# Patient Record
Sex: Female | Born: 1990
Health system: Southern US, Community
[De-identification: ages and names within clinical notes are randomized; demographics above are authoritative.]

## PROBLEM LIST (undated history)

## (undated) ENCOUNTER — Inpatient Hospital Stay (HOSPITAL_COMMUNITY): Payer: Self-pay

## (undated) DIAGNOSIS — E282 Polycystic ovarian syndrome: Secondary | ICD-10-CM

## (undated) DIAGNOSIS — R079 Chest pain, unspecified: Secondary | ICD-10-CM

## (undated) DIAGNOSIS — Z8489 Family history of other specified conditions: Secondary | ICD-10-CM

## (undated) DIAGNOSIS — R87629 Unspecified abnormal cytological findings in specimens from vagina: Secondary | ICD-10-CM

## (undated) DIAGNOSIS — G43909 Migraine, unspecified, not intractable, without status migrainosus: Secondary | ICD-10-CM

## (undated) HISTORY — PX: COLPOSCOPY: SHX161

## (undated) HISTORY — PX: WISDOM TOOTH EXTRACTION: SHX21

## (undated) HISTORY — DX: Unspecified abnormal cytological findings in specimens from vagina: R87.629

## (undated) HISTORY — DX: Chest pain, unspecified: R07.9

## (undated) HISTORY — DX: Migraine, unspecified, not intractable, without status migrainosus: G43.909

## (undated) HISTORY — PX: LAPAROSCOPIC GASTRIC SLEEVE RESECTION: SHX5895

---
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
## 2012-04-28 DIAGNOSIS — I880 Nonspecific mesenteric lymphadenitis: Secondary | ICD-10-CM | POA: Insufficient documentation

## 2012-04-28 DIAGNOSIS — R1012 Left upper quadrant pain: Secondary | ICD-10-CM | POA: Insufficient documentation

## 2012-04-28 DIAGNOSIS — R1011 Right upper quadrant pain: Secondary | ICD-10-CM | POA: Insufficient documentation

## 2012-04-29 ENCOUNTER — Emergency Department (HOSPITAL_COMMUNITY): Payer: BC Managed Care – PPO

## 2012-04-29 ENCOUNTER — Encounter (HOSPITAL_COMMUNITY): Payer: Self-pay | Admitting: Emergency Medicine

## 2012-04-29 ENCOUNTER — Emergency Department (HOSPITAL_COMMUNITY)
Admission: EM | Admit: 2012-04-29 | Discharge: 2012-04-29 | Disposition: A | Discharge status: Home / Self Care | Payer: BC Managed Care – PPO | Attending: Emergency Medicine | Admitting: Emergency Medicine

## 2012-04-29 DIAGNOSIS — I880 Nonspecific mesenteric lymphadenitis: Secondary | ICD-10-CM

## 2012-04-29 HISTORY — DX: Polycystic ovarian syndrome: E28.2

## 2012-04-29 LAB — COMPREHENSIVE METABOLIC PANEL
ALT: 14 U/L (ref 0–35)
CO2: 24 mEq/L (ref 19–32)
Calcium: 9.2 mg/dL (ref 8.4–10.5)
Creatinine, Ser: 0.69 mg/dL (ref 0.50–1.10)
GFR calc Af Amer: >90 mL/min (ref >90)
GFR calc non Af Amer: >90 mL/min (ref >90)
Glucose, Bld: 92 mg/dL (ref 70–99)

## 2012-04-29 LAB — CBC
HCT: 37.6 % (ref 36.0–46.0)
MCV: 83.4 fL (ref 78.0–100.0)
RBC: 4.51 MIL/uL (ref 3.87–5.11)
WBC: 10.8 10*3/uL — ABNORMAL HIGH (ref 4.0–10.5)

## 2012-04-29 LAB — URINALYSIS, ROUTINE W REFLEX MICROSCOPIC
Glucose, UA: NEGATIVE mg/dL
Hgb urine dipstick: NEGATIVE
Specific Gravity, Urine: 1.023 (ref 1.005–1.030)

## 2012-04-29 LAB — DIFFERENTIAL
Eosinophils Relative: 5 % (ref 0–5)
Lymphocytes Relative: 29 % (ref 12–46)
Lymphs Abs: 3.1 10*3/uL (ref 0.7–4.0)
Monocytes Absolute: 0.9 10*3/uL (ref 0.1–1.0)
Monocytes Relative: 8 % (ref 3–12)

## 2012-04-29 LAB — URINE MICROSCOPIC-ADD ON

## 2012-04-29 MED ORDER — IBUPROFEN 600 MG PO TABS: 600.0000 mg | ORAL_TABLET | Freq: Four times a day (QID) | ORAL | Status: AC | PRN

## 2012-04-29 MED ORDER — IOHEXOL 300 MG/ML  SOLN
100.0000 mL | Freq: Once | INTRAMUSCULAR | Status: AC | PRN
  Administered 2012-04-29: 100 mL via INTRAVENOUS

## 2012-04-29 NOTE — ED Notes (Signed)
Pt alert, nad, c/o RLQ abd pain, onset last PM, states pain is sharp, non radiating, resp even unlabored, skin pwd, denies changes in bowel or bladder habits

## 2012-04-29 NOTE — Discharge Instructions (Signed)
Mesenteric Adenitis  Mesenteric adenitis is an inflammation of lymph nodes (glands) in the abdomen. It may appear to mimic appendicitis symptoms. It is most common in children. The cause of this may be an infection somewhere else in the body. It usually gets well without treatment but can cause problems for up to a couple weeks.  SYMPTOMS   The most common problems are:   Fever.   Abdominal pain and tenderness.   Nausea, vomiting, and/or diarrhea.  DIAGNOSIS   Your caregiver may have an idea what is wrong by examining you or your child. Sometimes lab work and other studies such as Ultrasonography and a CT scan of the abdomen are done.   TREATMENT   Children with mesenteric adenitis will get well without further treatment. Treatment includes rest, pain medications, and fluids.  HOME CARE INSTRUCTIONS    Do not take or give laxatives unless ordered by your caregiver.   Use pain medications as directed.   Follow the diet recommended by your caregiver.  SEEK IMMEDIATE MEDICAL CARE IF:    The pain does not go away or becomes severe.   An oral temperature above 102 F (38.9 C) develops.   Repeated vomiting occurs.   The pain becomes localized in the right lower quadrant of the abdomen (possibly appendicitis).   You or your child notice bright red or black tarry stools.  MAKE SURE YOU:    Understand these instructions.   Will watch your condition.   Will get help right away if you are not doing well or get worse.  Document Released: 09/06/2006 Document Revised: 11/22/2011 Document Reviewed: 09/19/2006  ExitCare Patient Information 2012 [employer].

## 2012-04-29 NOTE — ED Notes (Signed)
Patient returned from CT

## 2012-04-29 NOTE — ED Provider Notes (Signed)
History     CSN: [id number]  Arrival date & time 04/28/12  2356   First MD Initiated Contact with Krystal Ferguson 04/29/12 0118      Chief Complaint  Krystal Ferguson presents with  . Abdominal Pain    (Consider location/radiation/quality/duration/timing/severity/associated sxs/prior treatment) HPI Comments: Krystal Ferguson states she started with.  Umbilical pain yesterday.  That is now gradually migrated to the right upper and lower quadrants.  She does have a history of polycystic ovarian syndrome.  Her last menstrual cycle was approximately 2 weeks ago.  She states she knows when she has an ovarian cyst in this is a different type of pain.  She is anorexic.  Denies dysuria, vaginal discharge, constipation, or diarrhea.  Krystal Ferguson is a 21 y.o. female presenting with abdominal pain. The history is provided by the Krystal Ferguson.  Abdominal Pain The primary symptoms of the illness include abdominal pain and nausea. The primary symptoms of the illness do not include fever, fatigue, shortness of breath, vomiting, diarrhea, dysuria, vaginal discharge or vaginal bleeding. The current episode started yesterday. The onset of the illness was gradual.  The abdominal pain began yesterday. The pain came on gradually. The abdominal pain has been gradually worsening since its onset. The abdominal pain is located in the RUQ and RLQ. The abdominal pain does not radiate. The severity of the abdominal pain is 5/10. The abdominal pain is relieved by nothing.  The Krystal Ferguson states that she believes she is currently not pregnant. The Krystal Ferguson has not had a change in bowel habit. Additional symptoms associated with the illness include anorexia. Symptoms associated with the illness do not include chills, constipation, urgency, frequency or back pain.    Past Medical History  Diagnosis Date  . Polycystic ovaries   . Diabetes mellitus     History reviewed. No pertinent past surgical history.  No family history on file.  History  Substance  Use Topics  . Smoking status: Never Smoker   . Smokeless tobacco: Not on file  . Alcohol Use: No    OB History    Grav Para Term Preterm Abortions TAB SAB Ect Mult Living                  Review of Systems  Constitutional: Positive for appetite change. Negative for fever, chills and fatigue.  Respiratory: Negative for shortness of breath.   Gastrointestinal: Positive for nausea, abdominal pain and anorexia. Negative for vomiting, diarrhea and constipation.  Genitourinary: Negative for dysuria, urgency, frequency, decreased urine volume, vaginal bleeding and vaginal discharge.  Musculoskeletal: Negative for back pain.    Allergies  Review of Krystal Ferguson's allergies indicates no known allergies.  Home Medications   Current Outpatient Rx  Name Route Sig Dispense Refill  . CETIRIZINE HCL 10 MG PO TABS Oral Take 10 mg by mouth daily.    . GUAIFENESIN ER 600 MG PO TB12 Oral Take 1,200 mg by mouth 2 (two) times daily.    Carma Leaven M PLUS PO TABS Oral Take 1 tablet by mouth daily.    Kathrynn Running ESTRADIOL-FE 0.8-25 MG-MCG PO CHEW Oral Chew 1 tablet by mouth daily.    . IBUPROFEN 600 MG PO TABS Oral Take 1 tablet (600 mg total) by mouth every 6 (six) hours as needed for pain. 30 tablet 0    BP 118/78  Pulse 89  Temp 98 F (36.7 C)  Resp 16  Wt 190 lb (86.183 kg)  SpO2 99%  LMP 04/15/2012  Physical Exam  Constitutional: She appears  well-developed and well-nourished.  HENT:  Head: Normocephalic.  Eyes: Pupils are equal, round, and reactive to light.  Neck: Normal range of motion.  Cardiovascular: Normal rate.   Pulmonary/Chest: Effort normal.  Abdominal: She exhibits no distension. There is tenderness. There is no rebound.      ED Course  Procedures (including critical care time)  Labs Reviewed  URINALYSIS, ROUTINE W REFLEX MICROSCOPIC - Abnormal; Notable for the following:    APPearance CLOUDY (*)    Leukocytes, UA SMALL (*)    All other components within normal  limits  CBC - Abnormal; Notable for the following:    WBC 10.8 (*)    Hemoglobin 11.9 (*)    All other components within normal limits  COMPREHENSIVE METABOLIC PANEL - Abnormal; Notable for the following:    Potassium 3.4 (*)    Albumin 3.3 (*)    Total Bilirubin 0.1 (*)    All other components within normal limits  URINE MICROSCOPIC-ADD ON - Abnormal; Notable for the following:    Squamous Epithelial / LPF FEW (*)    Bacteria, UA MANY (*)    All other components within normal limits  DIFFERENTIAL  POCT PREGNANCY, URINE   Ct Abdomen Pelvis W Contrast  04/29/2012  *RADIOLOGY REPORT*  Clinical Data: Right lower quadrant pain.  White cell count 10.8.  CT ABDOMEN AND PELVIS WITH CONTRAST  Technique:  Multidetector CT imaging of the abdomen and pelvis was performed following the standard protocol during bolus administration of intravenous contrast.  Contrast: OMNIPAQUE IOHEXOL 300 MG/ML  SOLN  Comparison: None.  Findings: Minimal dependent changes in the lung bases.  The liver, spleen, gallbladder, pancreas, adrenal glands, kidneys, abdominal aorta, and retroperitoneal lymph nodes are unremarkable. Portal and mesenteric vessels appear patent.  Stomach, small bowel, and colon are not distended.  No free air or free fluid in the abdomen.  Stool filled colon.  Pelvis:  The bladder wall is not thickened.  Uterus and adnexal structures are not enlarged.  No free or loculated pelvic fluid collections.  The appendix is normal.  Scattered lymph nodes in the right lower quadrant mesentery are at the upper limits of normal size.  These may be reactive nodes.  Mesenteric adenitis is not excluded.  Normal alignment of the lumbar vertebrae.  IMPRESSION: Mild nonspecific prominence of right lower quadrant mesenteric lymph nodes could represent reactive changes or mesenteric adenitis.  The appendix is normal.  No acute inflammatory process demonstrated in the abdomen or pelvis.  Original Report Authenticated By:  Marlon Pel, M.D.     1. Mesenteric adenitis       MDM  Both right upper and right lower quadrant, pain, we'll perform pelvic exam as she is in the right time frame for an ovarian, cyst.  We'll also obtain labs after review of this will determine if ultrasound or CT scan would be best for imaging        Arman Filter, NP 04/29/12 [phone]

## 2012-04-29 NOTE — ED Provider Notes (Signed)
Medical screening examination/treatment/procedure(s) were performed by non-physician practitioner and as supervising physician I was immediately available for consultation/collaboration.    Vida Roller, MD 04/29/12 [phone]

## 2012-08-22 ENCOUNTER — Emergency Department (HOSPITAL_COMMUNITY): Payer: BC Managed Care – PPO

## 2012-08-22 ENCOUNTER — Emergency Department (HOSPITAL_COMMUNITY)
Admission: EM | Admit: 2012-08-22 | Discharge: 2012-08-23 | Disposition: A | Discharge status: Home / Self Care | Payer: BC Managed Care – PPO | Attending: Emergency Medicine | Admitting: Emergency Medicine

## 2012-08-22 ENCOUNTER — Encounter (HOSPITAL_COMMUNITY): Payer: Self-pay | Admitting: *Deleted

## 2012-08-22 DIAGNOSIS — E119 Type 2 diabetes mellitus without complications: Secondary | ICD-10-CM | POA: Insufficient documentation

## 2012-08-22 DIAGNOSIS — R0789 Other chest pain: Secondary | ICD-10-CM

## 2012-08-22 DIAGNOSIS — R079 Chest pain, unspecified: Secondary | ICD-10-CM | POA: Insufficient documentation

## 2012-08-22 DIAGNOSIS — R000 Tachycardia, unspecified: Secondary | ICD-10-CM | POA: Insufficient documentation

## 2012-08-22 LAB — CBC
Hemoglobin: 12.5 g/dL (ref 12.0–15.0)
MCH: 28.2 pg (ref 26.0–34.0)
MCHC: 32.6 g/dL (ref 30.0–36.0)

## 2012-08-22 LAB — POCT I-STAT, CHEM 8
BUN: 8 mg/dL (ref 6–23)
Creatinine, Ser: 0.8 mg/dL (ref 0.50–1.10)
Glucose, Bld: 113 mg/dL — ABNORMAL HIGH (ref 70–99)
Hemoglobin: 13.3 g/dL (ref 12.0–15.0)
Potassium: 3.6 mEq/L (ref 3.5–5.1)

## 2012-08-22 LAB — POCT I-STAT TROPONIN I

## 2012-08-22 NOTE — ED Notes (Signed)
New EKG given to Dr Lynelle Doctor

## 2012-08-22 NOTE — ED Notes (Addendum)
Pt to ED c/o central chest pain, sob and tachycardia and diaphoresis.  Pt has been experiencing intermittent chest pain for 1 week, but tonight the pain increased. Pain and sob does not increase/decrease with movement or activity.  Pt states she was given maalox before coming to ED.

## 2012-08-22 NOTE — ED Notes (Signed)
The pt has had mid-chest pain for one to two weeks.  No cough no cold she reports she has sob.  No sob at present. Alert  Oriented skin warm and dry. 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

## 2012-08-23 LAB — D-DIMER, QUANTITATIVE: D-Dimer, Quant: 0.3 ug/mL-FEU (ref 0.00–0.48)

## 2012-08-23 MED ORDER — IBUPROFEN 600 MG PO TABS: 600.0000 mg | ORAL_TABLET | Freq: Four times a day (QID) | ORAL | Status: AC | PRN

## 2012-08-23 MED ORDER — KETOROLAC TROMETHAMINE 60 MG/2ML IM SOLN
60.0000 mg | Freq: Once | INTRAMUSCULAR | Status: AC
  Administered 2012-08-23: 60 mg via INTRAMUSCULAR
  Filled 2012-08-23: qty 2

## 2012-08-23 NOTE — ED Provider Notes (Signed)
History     CSN: [id number]  Arrival date & time 08/22/12  1911   First MD Initiated Contact with Patient 08/22/12 2357      Chief Complaint  Patient presents with  . Chest Pain  . Tachycardia    (Consider location/radiation/quality/duration/timing/severity/associated sxs/prior treatment) Patient is a 21 y.o. female presenting with chest pain. The history is provided by the patient. No language interpreter was used.  Chest Pain The chest pain began 5 - 7 days ago. Chest pain occurs constantly. The chest pain is worsening. Associated with: none. At its most intense, the pain is at 10/10. The pain is currently at 10/10. The quality of the pain is described as sharp. The pain does not radiate. Exacerbated by: none. Pertinent negatives for primary symptoms include no fever, no syncope, no cough and no nausea.  Associated symptoms include diaphoresis. She tried nothing for the symptoms. Risk factors include obesity.  Pertinent negatives for past medical history include no MI.  Pertinent negatives for family medical history include: no CAD in family.  Procedure history is negative for cardiac catheterization.     Past Medical History  Diagnosis Date  . Polycystic ovaries   . Diabetes mellitus     History reviewed. No pertinent past surgical history.  No family history on file.  History  Substance Use Topics  . Smoking status: Never Smoker   . Smokeless tobacco: Not on file  . Alcohol Use: No    OB History    Grav Para Term Preterm Abortions TAB SAB Ect Mult Living                  Review of Systems  Constitutional: Positive for diaphoresis. Negative for fever.  Respiratory: Negative for cough.   Cardiovascular: Positive for chest pain. Negative for leg swelling and syncope.  Gastrointestinal: Negative for nausea.  All other systems reviewed and are negative.    Allergies  Review of patient's allergies indicates no known allergies.  Home Medications   Current  Outpatient Rx  Name Route Sig Dispense Refill  . METFORMIN HCL 500 MG PO TABS Oral Take 500 mg by mouth 2 (two) times daily with a meal.    . NORETHIN-ETH ESTRADIOL-FE 0.8-25 MG-MCG PO CHEW Oral Chew 1 tablet by mouth daily.      BP 122/85  Pulse 77  Temp 98.4 F (36.9 C) (Oral)  Resp 16  Ht 5\' 3"  (1.6 m)  Wt 183 lb (83.008 kg)  BMI 32.42 kg/m2  SpO2 99%  LMP 08/13/2012  Physical Exam  Constitutional: She is oriented to person, place, and time. She appears well-developed and well-nourished. No distress.  HENT:  Head: Normocephalic and atraumatic.  Mouth/Throat: Oropharynx is clear and moist.  Eyes: Conjunctivae are normal. Pupils are equal, round, and reactive to light.  Neck: Normal range of motion. Neck supple.  Cardiovascular: Normal rate and regular rhythm.   Pulmonary/Chest: Effort normal and breath sounds normal. She has no wheezes. She has no rales.  Abdominal: Soft. Bowel sounds are normal. There is no tenderness. There is no rebound and no guarding.  Neurological: She is alert and oriented to person, place, and time.  Skin: Skin is warm and dry.  Psychiatric: She has a normal mood and affect.    ED Course  Procedures (including critical care time)  Labs Reviewed  POCT I-STAT, CHEM 8 - Abnormal; Notable for the following:    Glucose, Bld 113 (*)     All other components within normal  limits  CBC  POCT PREGNANCY, URINE  POCT I-STAT TROPONIN I  D-DIMER, QUANTITATIVE   Dg Chest 2 View  08/22/2012  *RADIOLOGY REPORT*  Clinical Data: Chest pain.  Tachycardia.  Shortness of breath.  CHEST - 2 VIEW  Comparison: Chest x-ray 09/26/2011.  Findings: Lung volumes are normal.  No consolidative airspace disease.  No pleural effusions.  No pneumothorax.  No pulmonary nodule or mass noted.  Pulmonary vasculature and the cardiomediastinal silhouette are within normal limits.  IMPRESSION: 1. No radiographic evidence of acute cardiopulmonary disease.   Original Report Authenticated  By: Florencia Reasons, M.D.      No diagnosis found.    MDM   Date: 08/23/2012  Rate: 80  Rhythm: normal sinus rhythm  QRS Axis: normal  Intervals: normal  ST/T Wave abnormalities: normal  Conduction Disutrbances: none  Narrative Interpretation: unremarkable      Follow up with your family doctor for ongoing care.  Return for worsening symptoms.        Jasmine Awe, MD 08/23/12 [phone]

## 2012-10-20 ENCOUNTER — Emergency Department (HOSPITAL_COMMUNITY)
Admission: EM | Admit: 2012-10-20 | Discharge: 2012-10-20 | Disposition: A | Discharge status: Home / Self Care | Payer: BC Managed Care – PPO | Attending: Emergency Medicine | Admitting: Emergency Medicine

## 2012-10-20 ENCOUNTER — Encounter (HOSPITAL_COMMUNITY): Payer: Self-pay | Admitting: Emergency Medicine

## 2012-10-20 DIAGNOSIS — E119 Type 2 diabetes mellitus without complications: Secondary | ICD-10-CM | POA: Insufficient documentation

## 2012-10-20 DIAGNOSIS — R071 Chest pain on breathing: Secondary | ICD-10-CM | POA: Insufficient documentation

## 2012-10-20 DIAGNOSIS — Z8742 Personal history of other diseases of the female genital tract: Secondary | ICD-10-CM | POA: Insufficient documentation

## 2012-10-20 DIAGNOSIS — R0602 Shortness of breath: Secondary | ICD-10-CM | POA: Insufficient documentation

## 2012-10-20 DIAGNOSIS — R0789 Other chest pain: Secondary | ICD-10-CM

## 2012-10-20 DIAGNOSIS — Z79899 Other long term (current) drug therapy: Secondary | ICD-10-CM | POA: Insufficient documentation

## 2012-10-20 LAB — POCT I-STAT TROPONIN I: Troponin i, poc: 0.01 ng/mL (ref 0.00–0.08)

## 2012-10-20 LAB — GLUCOSE, CAPILLARY: Glucose-Capillary: 81 mg/dL (ref 70–99)

## 2012-10-20 LAB — D-DIMER, QUANTITATIVE: D-Dimer, Quant: 0.35 ug/mL-FEU (ref 0.00–0.48)

## 2012-10-20 MED ORDER — IBUPROFEN 200 MG PO TABS
600.0000 mg | ORAL_TABLET | Freq: Once | ORAL | Status: AC
  Administered 2012-10-20: 600 mg via ORAL
  Filled 2012-10-20: qty 3

## 2012-10-20 MED ORDER — OXYCODONE-ACETAMINOPHEN 5-325 MG PO TABS
1.0000 | ORAL_TABLET | Freq: Once | ORAL | Status: AC
  Administered 2012-10-20: 1 via ORAL
  Filled 2012-10-20: qty 1

## 2012-10-20 NOTE — ED Provider Notes (Signed)
History     CSN: [id number]  Arrival date & time 10/20/12  1622   First MD Initiated Contact with Patient 10/20/12 1847      Chief Complaint  Patient presents with  . Chest Pain    (Consider location/radiation/quality/duration/timing/severity/associated sxs/prior treatment) Patient is a 21 y.o. female presenting with chest pain. The history is provided by the patient. No language interpreter was used.  Chest Pain The chest pain began 6 - 12 hours ago. Chest pain occurs rarely. The chest pain is improving. The pain is associated with stress. At its most intense, the pain is at 8/10. The pain is currently at 7/10. Primary symptoms include shortness of breath.    21 year old female coming in for the second time in 2 months for chest tightness and shortness of breath. States she was sitting in the classroom and he came on this time. States that the heaviness is 8/10 to her chest presently. Patient does not appear short of breath or in any distress sitting in a wheelchair.   Past Medical History  Diagnosis Date  . Polycystic ovaries   . Diabetes mellitus     History reviewed. No pertinent past surgical history.  No family history on file.  History  Substance Use Topics  . Smoking status: Never Smoker   . Smokeless tobacco: Not on file  . Alcohol Use: No    OB History    Grav Para Term Preterm Abortions TAB SAB Ect Mult Living                  Review of Systems  Constitutional: Negative.   HENT: Negative.   Eyes: Negative.   Respiratory: Positive for shortness of breath.   Cardiovascular: Positive for chest pain.  Gastrointestinal: Negative.   Neurological: Negative.   Psychiatric/Behavioral: Negative.   All other systems reviewed and are negative.    Allergies  Review of patient's allergies indicates no known allergies.  Home Medications   Current Outpatient Rx  Name  Route  Sig  Dispense  Refill  . CETIRIZINE HCL 10 MG PO TABS   Oral   Take 10 mg by  mouth every evening.         . ADULT MULTIVITAMIN W/MINERALS CH   Oral   Take 1 tablet by mouth daily.         Kathrynn Running ESTRADIOL-FE 0.8-25 MG-MCG PO CHEW   Oral   Chew 1 tablet by mouth daily.           BP 104/83  Pulse 72  Temp 98.9 F (37.2 C) (Oral)  Resp 20  SpO2 99%  LMP 10/13/2012  Physical Exam  Nursing note and vitals reviewed. Constitutional: She is oriented to person, place, and time. She appears well-developed and well-nourished.  HENT:  Head: Normocephalic and atraumatic.  Eyes: Conjunctivae normal and EOM are normal. Pupils are equal, round, and reactive to light.  Neck: Normal range of motion. Neck supple.  Cardiovascular: Normal rate, regular rhythm, normal heart sounds and intact distal pulses.  Exam reveals no gallop and no friction rub.   No murmur heard. Pulmonary/Chest: Effort normal and breath sounds normal. No respiratory distress. She exhibits tenderness.  Abdominal: Soft. Bowel sounds are normal.  Musculoskeletal: Normal range of motion. She exhibits no edema and no tenderness.  Neurological: She is alert and oriented to person, place, and time. She has normal reflexes.  Skin: Skin is warm and dry.  Psychiatric: She has a normal mood and affect.  ED Course  Procedures (including critical care time)   Labs Reviewed  GLUCOSE, CAPILLARY  D-DIMER, QUANTITATIVE   No results found.   1. Chest wall pain       MDM  21 year-old with recurring chest pain shortness of breath. I believe  she's having panic attacks. Or she could be having thyroid problems. Patient will get a PCP to followup with. D-dimer and troponin are negative. She does have tenderness upon examination to her chest. No acute distress today in the ER. She was here one month ago for the same.  Labs Reviewed  GLUCOSE, CAPILLARY  D-DIMER, QUANTITATIVE  POCT I-STAT TROPONIN I          Remi Haggard, NP 10/20/12 2047  Date: 10/20/2012  Rate: 80  Rhythm:  normal sinus rhythm  QRS Axis: normal  Intervals: normal  ST/T Wave abnormalities: normal  Conduction Disutrbances:none  Narrative Interpretation:   Old EKG Reviewed: unchanged    Remi Haggard, NP 10/20/12 2051

## 2012-10-20 NOTE — ED Notes (Signed)
Pt presenting to ed with c/o chest pain and shortness of breath pt states she was sitting in class and she couldn't breath. Pt states she had an episode x 1 month ago and she went to [facility] and everything was negative. Pt is in nad in triage.

## 2012-10-20 NOTE — ED Notes (Signed)
Pt friend sts "it's getting harder for her to breath, how long until she comes back?" Checked patients O2 saturation and heart rate again, VSS- pt assured that we are working as fast as we can and that we will call her as soon as possible.

## 2012-10-20 NOTE — ED Provider Notes (Signed)
Medical screening examination/treatment/procedure(s) were performed by non-physician practitioner and as supervising physician I was immediately available for consultation/collaboration.   Gwyneth Sprout, MD 10/20/12 2306

## 2012-11-19 ENCOUNTER — Encounter: Payer: Self-pay | Admitting: Cardiovascular Disease

## 2012-11-19 ENCOUNTER — Encounter: Payer: Self-pay | Admitting: *Deleted

## 2012-11-19 DIAGNOSIS — E282 Polycystic ovarian syndrome: Secondary | ICD-10-CM | POA: Insufficient documentation

## 2012-11-19 DIAGNOSIS — R079 Chest pain, unspecified: Secondary | ICD-10-CM | POA: Insufficient documentation

## 2012-11-20 ENCOUNTER — Ambulatory Visit (INDEPENDENT_AMBULATORY_CARE_PROVIDER_SITE_OTHER): Payer: BC Managed Care – PPO | Admitting: Cardiovascular Disease

## 2012-11-20 VITALS — BP 118/82 | HR 97 | Wt 208.0 lb

## 2012-11-20 DIAGNOSIS — R0602 Shortness of breath: Secondary | ICD-10-CM

## 2012-11-20 DIAGNOSIS — R079 Chest pain, unspecified: Secondary | ICD-10-CM

## 2012-11-20 DIAGNOSIS — E282 Polycystic ovarian syndrome: Secondary | ICD-10-CM

## 2012-11-20 LAB — SEDIMENTATION RATE: Sed Rate: 38 mm/hr — ABNORMAL HIGH (ref 0–22)

## 2012-11-20 LAB — T4, FREE: Free T4: 1 ng/dL (ref 0.60–1.60)

## 2012-11-20 LAB — C3 COMPLEMENT: C3 Complement: 148 mg/dL (ref 90–180)

## 2012-11-20 NOTE — Assessment & Plan Note (Signed)
Weight gain and some of her atypical symptoms may be related to PCOS  F/u primary

## 2012-11-20 NOTE — Assessment & Plan Note (Signed)
Atypical but in ER twice.  F/U stress echo.  Will get labs to R/O connective tissue disease, check thyroid. No relief with prednisone makes this less likely.

## 2012-11-20 NOTE — Patient Instructions (Addendum)
Your physician recommends that you schedule a follow-up appointment in: AS NEEDED  Your physician recommends that you continue on your current medications as directed. Please refer to the Current Medication list given to you today.  Your physician has requested that you have an echocardiogram. Echocardiography is a painless test that uses sound waves to create images of your heart. It provides your doctor with information about the size and shape of your heart and how well your heart's chambers and valves are working. This procedure takes approximately one hour. There are no restrictions for this procedure. DX SHORTNESS OF BREATH Your physician has requested that you have an exercise tolerance test. For further information please visit [email or web address] Please also follow instruction sheet, as given. DX CHEST PAIN  Your physician recommends that you return for lab work in: TODAY  TSH  FREE T4  SED RATE  ANA COMPLEMENT LEVEL 3 AND 4   AND RF

## 2012-11-20 NOTE — Progress Notes (Signed)
Patient ID: Krystal Ferguson, female   DOB: 08/24/1991, 21 y.o.   MRN: [id number] 21 yo referred by primary for chest pain. Seen in ER September and a few weeks ago.  W/U negative Normal ECG;s negative enzymes, One note indicated panic attacks.  Her pain is atypical sharp, occassionaly pleuritc.  Tried on steroids by primary with no change.  Denies trauma, connective tissue disease. She does not feel that she is anxious.  She does go to school at [employer] studying nursing and works at [employer].  She has done this for years.  Thyroid not checked in ER. No family history lupus , thyroid disease or inflamatory disease.  No family history of congenital heart disease.  She has fatigue, exertional dyspnea.  And occasional palpitations. No syncope.  Mild cough no fever  ROS: Denies fever, malais, weight loss, blurry vision, decreased visual acuity, cough, sputum, SOB, hemoptysis, pleuritic pain, palpitaitons, heartburn, abdominal pain, melena, lower extremity edema, claudication, or rash.  All other systems reviewed and negative   General: Affect appropriate Overweight  HEENT: normal Neck supple with no adenopathy JVP normal no bruits no thyromegaly Lungs clear with no wheezing and good diaphragmatic motion Heart:  S1/S2 no murmur,rub, gallop or click PMI normal Abdomen: benighn, BS positve, no tenderness, no AAA no bruit.  No HSM or HJR Distal pulses intact with no bruits No edema Neuro non-focal Skin warm and dry No muscular weakness  Medications Current Outpatient Prescriptions  Medication Sig Dispense Refill  . cetirizine (ZYRTEC) 10 MG tablet Take 10 mg by mouth every evening.      . Multiple Vitamin (MULTIVITAMIN WITH MINERALS) TABS Take 1 tablet by mouth daily.      . Norethindrone-Ethinyl Estradiol-Fe (GENERESS FE) 0.8-25 MG-MCG tablet Chew 1 tablet by mouth daily.        Allergies Review of patient's allergies indicates no known allergies.  Family History: No family history on  file.  Social History: History   Social History  . Marital Status: Single    Spouse Name: N/A    Number of Children: N/A  . Years of Education: N/A   Occupational History  . Not on file.   Social History Main Topics  . Smoking status: Never Smoker   . Smokeless tobacco: Not on file  . Alcohol Use: No  . Drug Use: No  . Sexually Active:    Other Topics Concern  . Not on file   Social History Narrative  . No narrative on file    Electrocardiogram:9/16  NSR normal no pericarditis,  10/20/12  SR rate 77 normal  Assessment and Plan

## 2012-11-24 ENCOUNTER — Telehealth: Payer: Self-pay | Admitting: Cardiovascular Disease

## 2012-11-24 NOTE — Telephone Encounter (Signed)
Pt rtn christine's call, pls call back

## 2012-11-24 NOTE — Telephone Encounter (Signed)
LMTCB ./CY 

## 2012-11-24 NOTE — Telephone Encounter (Signed)
PT AWARE OF LAB RESULTS./CY 

## 2012-11-27 ENCOUNTER — Ambulatory Visit (HOSPITAL_COMMUNITY): Payer: BC Managed Care – PPO | Attending: Cardiovascular Disease | Admitting: Radiology

## 2012-11-27 ENCOUNTER — Other Ambulatory Visit: Payer: Self-pay

## 2012-11-27 ENCOUNTER — Other Ambulatory Visit: Payer: Self-pay | Admitting: *Deleted

## 2012-11-27 DIAGNOSIS — R079 Chest pain, unspecified: Secondary | ICD-10-CM

## 2012-11-27 DIAGNOSIS — R072 Precordial pain: Secondary | ICD-10-CM

## 2012-11-27 DIAGNOSIS — R0602 Shortness of breath: Secondary | ICD-10-CM

## 2012-11-27 DIAGNOSIS — Z8249 Family history of ischemic heart disease and other diseases of the circulatory system: Secondary | ICD-10-CM | POA: Insufficient documentation

## 2012-11-27 DIAGNOSIS — E669 Obesity, unspecified: Secondary | ICD-10-CM | POA: Insufficient documentation

## 2012-11-27 NOTE — Progress Notes (Signed)
Echocardiogram performed.  

## 2012-11-28 ENCOUNTER — Telehealth: Payer: Self-pay | Admitting: Cardiovascular Disease

## 2012-11-28 NOTE — Telephone Encounter (Signed)
New Problem:    Patient called in returning your call.  Please call back and reel free to speak with her mother.

## 2012-11-28 NOTE — Telephone Encounter (Signed)
Pt. given Echo results, she verbalized understanding.

## 2012-12-15 ENCOUNTER — Ambulatory Visit (INDEPENDENT_AMBULATORY_CARE_PROVIDER_SITE_OTHER): Payer: BC Managed Care – PPO | Admitting: Physician Assistant

## 2012-12-15 DIAGNOSIS — R079 Chest pain, unspecified: Secondary | ICD-10-CM

## 2012-12-15 DIAGNOSIS — R0602 Shortness of breath: Secondary | ICD-10-CM

## 2012-12-15 NOTE — Progress Notes (Signed)
Exercise Treadmill Test  Pre-Exercise Testing Evaluation Rhythm: normal sinus  Rate: 75                 Test  Exercise Tolerance Test Ordering MD: Charlton Haws, MD  Interpreting MD: Tereso Newcomer, PA-C  Unique Test No: 1  Treadmill:  1  Indication for ETT: chest pain - rule out ischemia  Contraindication to ETT: No   Stress Modality: exercise - treadmill  Cardiac Imaging Performed: non   Protocol: standard Bruce - maximal  Max BP:  150/64  Max MPHR (bpm):  201 85% MPR (bpm):  171  MPHR obtained (bpm):  193 % MPHR obtained:  96  Reached 85% MPHR (min:sec):  6:15 Total Exercise Time (min-sec):  9:00  Workload in METS:  10.4 Borg Scale: 17  Reason ETT Terminated:  patient's desire to stop    ST Segment Analysis At Rest: normal ST segments - no evidence of significant ST depression With Exercise: no evidence of significant ST depression  Other Information Arrhythmia:  No Angina during ETT:  present (1) Quality of ETT:  diagnostic  ETT Interpretation:  normal - no evidence of ischemia by ST analysis  Comments: Good exercise tolerance. Patient complained of chest pain prior to starting ETT.  Pain was made worse with exercise. Normal BP response to exercise. No ST-T changes to suggest ischemia.  Clinically positive, electrically negative ETT.  Recommendations: Follow up with Dr. Charlton Haws and PCP as directed to evaluate other causes of chest pain. Signed,  Tereso Newcomer, PA-C  3:28 PM 12/15/2012

## 2012-12-19 ENCOUNTER — Ambulatory Visit (INDEPENDENT_AMBULATORY_CARE_PROVIDER_SITE_OTHER): Payer: BC Managed Care – PPO | Admitting: Family Medicine

## 2012-12-19 ENCOUNTER — Ambulatory Visit: Payer: BC Managed Care – PPO

## 2012-12-19 VITALS — BP 117/78 | HR 76 | Temp 98.5°F | Resp 16 | Ht 63.0 in | Wt 211.0 lb

## 2012-12-19 DIAGNOSIS — IMO0001 Reserved for inherently not codable concepts without codable children: Secondary | ICD-10-CM

## 2012-12-19 DIAGNOSIS — M79642 Pain in left hand: Secondary | ICD-10-CM

## 2012-12-19 DIAGNOSIS — M25552 Pain in left hip: Secondary | ICD-10-CM

## 2012-12-19 DIAGNOSIS — M79609 Pain in unspecified limb: Secondary | ICD-10-CM

## 2012-12-19 DIAGNOSIS — S6390XA Sprain of unspecified part of unspecified wrist and hand, initial encounter: Secondary | ICD-10-CM

## 2012-12-19 DIAGNOSIS — M25559 Pain in unspecified hip: Secondary | ICD-10-CM

## 2012-12-19 DIAGNOSIS — S63509A Unspecified sprain of unspecified wrist, initial encounter: Secondary | ICD-10-CM

## 2012-12-19 DIAGNOSIS — S63502A Unspecified sprain of left wrist, initial encounter: Secondary | ICD-10-CM
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
## 2012-12-19 NOTE — Progress Notes (Signed)
   Patient ID: Krystal Ferguson MRN: [id number], DOB: 08-Sep-1991, 22 y.o. Date of Encounter: 12/19/2012, 7:16 PM   PROCEDURE NOTE: Verbal consent obtained.  Buddy tape left 4th and 3rd digits. Ulnar gutter splint applied to the left extremity. Wrapped with ACE wrap per usual protocol.  Cap refill less than 2 seconds through all digits. Patient tolerated well.   SignedEula Listen, PA-C 12/19/2012 7:16 PM

## 2013-04-03 ENCOUNTER — Ambulatory Visit: Payer: 59

## 2013-05-19 ENCOUNTER — Ambulatory Visit (INDEPENDENT_AMBULATORY_CARE_PROVIDER_SITE_OTHER): Payer: 59 | Admitting: Family Medicine

## 2013-05-19 VITALS — BP 124/84 | HR 104 | Temp 98.0°F | Resp 17 | Ht 63.5 in | Wt 228.0 lb

## 2013-05-19 DIAGNOSIS — R040 Epistaxis: Secondary | ICD-10-CM

## 2013-05-19 DIAGNOSIS — E119 Type 2 diabetes mellitus without complications: Secondary | ICD-10-CM

## 2013-05-19 LAB — POCT CBC
Granulocyte percent: 70.7 % (ref 37–80)
HCT, POC: 43.4 % (ref 37.7–47.9)
Hemoglobin: 13.3 g/dL (ref 12.2–16.2)
Lymph, poc: 2.3 (ref 0.6–3.4)
MCH, POC: 28.7 pg (ref 27–31.2)
MCHC: 30.6 g/dL — AB (ref 31.8–35.4)
MCV: 93.6 fL (ref 80–97)
MID (cbc): 0.5 (ref 0–0.9)
MPV: 10.7 fL (ref 0–99.8)
POC Granulocyte: 6.6 (ref 2–6.9)
POC LYMPH PERCENT: 24.1 %L (ref 10–50)
POC MID %: 5.2 %M (ref 0–12)
Platelet Count, POC: 244 10*3/uL (ref 142–424)
RBC: 4.64 M/uL (ref 4.04–5.48)
RDW, POC: 13.2 %
WBC: 9.4 10*3/uL (ref 4.6–10.2)

## 2013-05-19 LAB — POCT GLYCOSYLATED HEMOGLOBIN (HGB A1C): Hemoglobin A1C: 5.1

## 2013-05-19 LAB — GLUCOSE, POCT (MANUAL RESULT ENTRY): POC Glucose: 80 mg/dL (ref 70–99)

## 2013-05-19 NOTE — Patient Instructions (Addendum)
Nosebleed  Nosebleeds can be caused by many conditions including trauma, infections, polyps, foreign bodies, dry mucous membranes or climate, medications and air conditioning. Most nosebleeds occur in the front of the nose. It is because of this location that most nosebleeds can be controlled by pinching the nostrils gently and continuously. Do this for at least 10 to 20 minutes. The reason for this long continuous pressure is that you must hold it long enough for the blood to clot. If during that 10 to 20 minute time period, pressure is released, the process may have to be started again. The nosebleed may stop by itself, quit with pressure, need concentrated heating (cautery) or stop with pressure from packing.  HOME CARE INSTRUCTIONS    If your nose was packed, try to maintain the pack inside until your caregiver removes it. If a gauze pack was used and it starts to fall out, gently replace or cut the end off. Do not cut if a balloon catheter was used to pack the nose. Otherwise, do not remove unless instructed.   Avoid blowing your nose for 12 hours after treatment. This could dislodge the pack or clot and start bleeding again.   If the bleeding starts again, sit up and bending forward, gently pinch the front half of your nose continuously for 20 minutes.   If bleeding was caused by dry mucous membranes, cover the inside of your nose every morning with a petroleum or antibiotic ointment. Use your little fingertip as an applicator. Do this as needed during dry weather. This will keep the mucous membranes moist and allow them to heal.   Maintain humidity in your home by using less air conditioning or using a humidifier.   Do not use aspirin or medications which make bleeding more likely. Your caregiver can give you recommendations on this.   Resume normal activities as able but try to avoid straining, lifting or bending at the waist for several days.   If the nosebleeds become recurrent and the cause is  unknown, your caregiver may suggest laboratory tests.  SEEK IMMEDIATE MEDICAL CARE IF:    Bleeding recurs and cannot be controlled.   There is unusual bleeding from or bruising on other parts of the body.   You have a fever.   Nosebleeds continue.   There is any worsening of the condition which originally brought you in.   You become lightheaded, feel faint, become sweaty or vomit blood.  MAKE SURE YOU:    Understand these instructions.   Will watch your condition.   Will get help right away if you are not doing well or get worse.  Document Released: 09/12/2005 Document Revised: 02/25/2012 Document Reviewed: 11/04/2009  [employer] Patient Information 2014 [employer].

## 2013-05-19 NOTE — Progress Notes (Signed)
[facility]:  Office Visit  Chief Complaint:  Chief Complaint  Patient presents with  . Epistaxis    HPI: Krystal Ferguson is a 22 y.o. female who complains of  Epistaxis since Sunday, 8 episodes, last 18-20 minutes. Used ice, pinching nose, sometimes helps. She has had clots. Feels dizzy. USed to get them a lot when she was younger but has not had one recently. Deneis picking her nose or putting anything up there, house is not dry. She has been blowing her nose only when she feels there is something the ie a clot.  Has diabetes last seen 1 year ago. Not on meds. Was taking metformin but PCP took her off of it. She also has PCOS   Past Medical History  Diagnosis Date  . Polycystic ovaries   . Diabetes mellitus   . Chest pain    History reviewed. No pertinent past surgical history. History   Social History  . Marital Status: Single    Spouse Name: N/A    Number of Children: N/A  . Years of Education: N/A   Social History Main Topics  . Smoking status: Never Smoker   . Smokeless tobacco: None  . Alcohol Use: No  . Drug Use: No  . Sexually Active: No   Other Topics Concern  . None   Social History Narrative  . None   Family History  Problem Relation Age of Onset  . Bladder Cancer Mother   . Kidney disease Father   . Heart disease Maternal Grandmother   . Heart disease Paternal Grandmother    No Known Allergies Prior to Admission medications   Medication Sig Start Date End Date Taking? Authorizing Provider  Multiple Vitamin (MULTIVITAMIN WITH MINERALS) TABS Take 1 tablet by mouth daily.   Yes Historical Provider, MD  Norethindrone-Ethinyl Estradiol-Fe (GENERESS FE) 0.8-25 MG-MCG tablet Chew 1 tablet by mouth daily.   Yes Historical Provider, MD     ROS: The patient denies fevers, chills, night sweats, unintentional weight loss, chest pain, palpitations, wheezing, dyspnea on exertion, nausea, vomiting, abdominal pain, dysuria, hematuria,  melena, numbness, weakness, or tingling.   All other systems have been reviewed and were otherwise negative with the exception of those mentioned in the HPI and as above.    PHYSICAL EXAM: Filed Vitals:   05/19/13 1948  BP: 124/84  Pulse: 104  Temp: 98 F (36.7 C)  Resp: 17   Filed Vitals:   05/19/13 1948  Height: 5' 3.5" (1.613 m)  Weight: 228 lb (103.42 kg)   Body mass index is 39.75 kg/(m^2).  General: Alert, no acute distress HEENT:  Normocephalic, atraumatic, oropharynx patent. + left klessenbach nasal plexus bleeding vessels Cardiovascular:  Regular rate and rhythm, no rubs murmurs or gallops.  No Carotid bruits, radial pulse intact. No pedal edema.  Respiratory: Clear to auscultation bilaterally.  No wheezes, rales, or rhonchi.  No cyanosis, no use of accessory musculature GI: No organomegaly, abdomen is soft and non-tender, positive bowel sounds.  No masses. Skin: No rashes. Neurologic: Facial musculature symmetric. Psychiatric: Patient is appropriate throughout our interaction. Lymphatic: No cervical lymphadenopathy Musculoskeletal: Gait intact.   LABS: Results for orders placed in visit on 05/19/13  POCT CBC      Result Value Range   WBC 9.4  4.6 - 10.2 K/uL   Lymph, poc 2.3  0.6 - 3.4   POC LYMPH PERCENT 24.1  10 - 50 %L   MID (cbc) 0.5  0 - 0.9  POC MID % 5.2  0 - 12 %M   POC Granulocyte 6.6  2 - 6.9   Granulocyte percent 70.7  37 - 80 %G   RBC 4.64  4.04 - 5.48 M/uL   Hemoglobin 13.3  12.2 - 16.2 g/dL   HCT, POC 13.0  86.5 - 47.9 %   MCV 93.6  80 - 97 fL   MCH, POC 28.7  27 - 31.2 pg   MCHC 30.6 (*) 31.8 - 35.4 g/dL   RDW, POC 78.4     Platelet Count, POC 244  142 - 424 K/uL   MPV 10.7  0 - 99.8 fL  POCT GLYCOSYLATED HEMOGLOBIN (HGB A1C)      Result Value Range   Hemoglobin A1C 5.1    GLUCOSE, POCT (MANUAL RESULT ENTRY)      Result Value Range   POC Glucose 80  70 - 99 mg/dl     EKG/XRAY:   Primary read interpreted by Dr. Conley Rolls at  [facility].   ASSESSMENT/PLAN: Encounter Diagnoses  Name Primary?  . Diabetes Yes  . Epistaxis    Impreganted gauze with lidocaine and epi but did not work Sport and exercise psychologist were Cauterized with Silver nitrate Patient consented to procedure Recheck diabetes, doing ok not cause of dizziness. Glucose was WNL F/u prn    LE, THAO PHUONG, DO 05/19/2013 8:39 PM

## 2013-05-20 LAB — COMPREHENSIVE METABOLIC PANEL
Albumin: 4.4 g/dL (ref 3.5–5.2)
BUN: 9 mg/dL (ref 6–23)
Calcium: 9.5 mg/dL (ref 8.4–10.5)
Chloride: 103 mEq/L (ref 96–112)
Glucose, Bld: 87 mg/dL (ref 70–99)
Potassium: 4.3 mEq/L (ref 3.5–5.3)
Sodium: 141 mEq/L (ref 135–145)
Total Protein: 7.4 g/dL (ref 6.0–8.3)

## 2013-05-20 LAB — COMPREHENSIVE METABOLIC PANEL WITH GFR
ALT: 18 U/L (ref 0–35)
AST: 18 U/L (ref 0–37)
Alkaline Phosphatase: 46 U/L (ref 39–117)
CO2: 24 meq/L (ref 19–32)
Creat: 0.83 mg/dL (ref 0.50–1.10)
Total Bilirubin: 0.3 mg/dL (ref 0.3–1.2)

## 2013-07-20 ENCOUNTER — Emergency Department (HOSPITAL_COMMUNITY)
Admission: EM | Admit: 2013-07-20 | Discharge: 2013-07-20 | Disposition: A | Discharge status: Home / Self Care | Payer: BC Managed Care – PPO | Source: Home / Self Care | Attending: Emergency Medicine | Admitting: Emergency Medicine

## 2013-07-20 ENCOUNTER — Encounter (HOSPITAL_COMMUNITY): Payer: Self-pay | Admitting: *Deleted

## 2013-07-20 DIAGNOSIS — J039 Acute tonsillitis, unspecified: Secondary | ICD-10-CM

## 2013-07-20 MED ORDER — HYDROCODONE-ACETAMINOPHEN 5-325 MG PO TABS: ORAL_TABLET | ORAL | Status: DC

## 2013-07-20 MED ORDER — PENICILLIN V POTASSIUM 500 MG PO TABS: 500.0000 mg | ORAL_TABLET | Freq: Four times a day (QID) | ORAL | Status: DC

## 2013-07-20 NOTE — ED Provider Notes (Signed)
Chief Complaint:   Chief Complaint  Patient presents with  . Sore Throat    History of Present Illness:   Krystal Ferguson is a 21 year old female, a nurse's aide and [employer] who presents today with a three-day history of sore throat, pain on swallowing, headache, and swollen glands. She denies any nasal congestion, rhinorrhea, cough, or GI symptoms. She has not been exposed to anyone with strep.  Review of Systems:  Other than as noted above, the patient denies any of the following symptoms. Systemic:  No fever, chills, sweats, fatigue, myalgias, headache, or anorexia. Eye:  No redness, pain or drainage. ENT:  No earache, ear congestion, nasal congestion, sneezing, rhinorrhea, sinus pressure, sinus pain, or post nasal drip. Lungs:  No cough, sputum production, wheezing, shortness of breath, or chest pain. GI:  No abdominal pain, nausea, vomiting, or diarrhea. Skin:  No rash or itching.  PMFSH:  Past medical history, family history, social history, meds, allergies, and nurse's notes were reviewed.  There is no known exposure to strep or mono.  No prior history of step or mono.  The patient denies use of tobacco.   Physical Exam:   Vital signs:  BP 133/79  Pulse 84  Temp(Src) 98.2 F (36.8 C) (Oral)  Resp 16  SpO2 100%  LMP 07/15/2013 General:  Alert, in no distress. Eye:  No conjunctival injection or drainage. Lids were normal. ENT:  TMs and canals were normal, without erythema or inflammation.  Nasal mucosa was clear and uncongested, without drainage.  Mucous membranes were moist.  Exam of pharynx reveals tonsils to be enlarged and red with spots of white exudate.  There were no oral ulcerations or lesions. The uvula was midline and there was no bulging of the anterior tonsillar pillars. Neck:  Supple, anterior cervical nodes were enlarged and tender. Lungs:  No respiratory distress.  Lungs were clear to auscultation, without wheezes, rales or  rhonchi.  Breath sounds were clear and equal bilaterally.  Heart:  Regular rhythm, without gallops, murmers or rubs. Skin:  Clear, warm, and dry, without rash or lesions.  Labs:   Results for orders placed during the hospital encounter of 07/20/13  POCT RAPID STREP A ([facility] ONLY)      Result Value Range   Streptococcus, Group A Screen (Direct) NEGATIVE  NEGATIVE   Assessment:  The encounter diagnosis was Tonsillitis.  No evidence of peritonsillar abscess.  Plan:   1.  The following meds were prescribed:   Discharge Medication List as of 07/20/2013  7:12 PM    START taking these medications   Details  HYDROcodone-acetaminophen (NORCO/VICODIN) 5-325 MG per tablet 1 to 2 tabs every 4 to 6 hours as needed for pain., Print    penicillin v potassium (VEETID) 500 MG tablet Take 1 tablet (500 mg total) by mouth 4 (four) times daily., Starting 07/20/2013, Until Discontinued, Normal       2.  The patient was instructed in symptomatic care including hot saline gargles, throat lozenges, infectious precautions, and need to trade out toothbrush. Handouts were given. 3.  The patient was told to return if becoming worse in any way, if no better in 3 or 4 days, and given some red flag symptoms such as difficulty swallowing or breathing that would indicate earlier return. 4.  Follow up here if necessary.    Reuben Likes, MD 07/20/13 2200

## 2013-07-20 NOTE — ED Notes (Signed)
C/o sore throat onset 3 days ago.  No fever.  C/o headache and states her glands are a little swollen.

## 2013-07-22 LAB — CULTURE, GROUP A STREP

## 2013-07-27 ENCOUNTER — Encounter (HOSPITAL_COMMUNITY): Payer: Self-pay | Admitting: *Deleted

## 2013-09-05 ENCOUNTER — Emergency Department (HOSPITAL_COMMUNITY)
Admission: EM | Admit: 2013-09-05 | Discharge: 2013-09-05 | Disposition: A | Discharge status: Home / Self Care | Payer: 59 | Attending: Emergency Medicine | Admitting: Emergency Medicine

## 2013-09-05 ENCOUNTER — Encounter (HOSPITAL_COMMUNITY): Payer: Self-pay | Admitting: Emergency Medicine

## 2013-09-05 ENCOUNTER — Emergency Department (HOSPITAL_COMMUNITY): Payer: 59

## 2013-09-05 DIAGNOSIS — Z79899 Other long term (current) drug therapy: Secondary | ICD-10-CM | POA: Insufficient documentation

## 2013-09-05 DIAGNOSIS — G43109 Migraine with aura, not intractable, without status migrainosus: Secondary | ICD-10-CM | POA: Insufficient documentation

## 2013-09-05 DIAGNOSIS — Z3202 Encounter for pregnancy test, result negative: Secondary | ICD-10-CM | POA: Insufficient documentation

## 2013-09-05 DIAGNOSIS — R209 Unspecified disturbances of skin sensation: Secondary | ICD-10-CM | POA: Insufficient documentation

## 2013-09-05 DIAGNOSIS — R9389 Abnormal findings on diagnostic imaging of other specified body structures: Secondary | ICD-10-CM | POA: Insufficient documentation

## 2013-09-05 DIAGNOSIS — E119 Type 2 diabetes mellitus without complications: Secondary | ICD-10-CM | POA: Insufficient documentation

## 2013-09-05 DIAGNOSIS — Z8742 Personal history of other diseases of the female genital tract: Secondary | ICD-10-CM | POA: Insufficient documentation

## 2013-09-05 LAB — CBC
HCT: 35.7 % — ABNORMAL LOW (ref 36.0–46.0)
Hemoglobin: 11.8 g/dL — ABNORMAL LOW (ref 12.0–15.0)
MCH: 29.1 pg (ref 26.0–34.0)
MCHC: 33.1 g/dL (ref 30.0–36.0)
RBC: 4.05 MIL/uL (ref 3.87–5.11)

## 2013-09-05 LAB — BASIC METABOLIC PANEL
BUN: 7 mg/dL (ref 6–23)
CO2: 24 mEq/L (ref 19–32)
Calcium: 9.1 mg/dL (ref 8.4–10.5)
GFR calc non Af Amer: >90 mL/min (ref >90)
Glucose, Bld: 90 mg/dL (ref 70–99)
Potassium: 3.8 mEq/L (ref 3.5–5.1)
Sodium: 136 mEq/L (ref 135–145)

## 2013-09-05 LAB — URINALYSIS, ROUTINE W REFLEX MICROSCOPIC
Bilirubin Urine: NEGATIVE
Glucose, UA: NEGATIVE mg/dL
Hgb urine dipstick: NEGATIVE
Specific Gravity, Urine: 1.016 (ref 1.005–1.030)

## 2013-09-05 LAB — URINE MICROSCOPIC-ADD ON

## 2013-09-05 LAB — POCT PREGNANCY, URINE: Preg Test, Ur: NEGATIVE

## 2013-09-05 MED ORDER — ONDANSETRON HCL 4 MG/2ML IJ SOLN
4.0000 mg | Freq: Once | INTRAMUSCULAR | Status: AC
  Administered 2013-09-05: 4 mg via INTRAVENOUS
  Filled 2013-09-05: qty 2

## 2013-09-05 MED ORDER — LORAZEPAM 2 MG/ML IJ SOLN
1.0000 mg | Freq: Once | INTRAMUSCULAR | Status: AC
  Administered 2013-09-05: 1 mg via INTRAVENOUS
  Filled 2013-09-05: qty 1

## 2013-09-05 MED ORDER — DIPHENHYDRAMINE HCL 50 MG/ML IJ SOLN
25.0000 mg | Freq: Once | INTRAMUSCULAR | Status: AC
  Administered 2013-09-05: 25 mg via INTRAVENOUS
  Filled 2013-09-05: qty 1

## 2013-09-05 MED ORDER — SODIUM CHLORIDE 0.9 % IV BOLUS (SEPSIS)
1000.0000 mL | Freq: Once | INTRAVENOUS | Status: AC
  Administered 2013-09-05: 1000 mL via INTRAVENOUS

## 2013-09-05 MED ORDER — KETOROLAC TROMETHAMINE 30 MG/ML IJ SOLN
30.0000 mg | Freq: Once | INTRAMUSCULAR | Status: AC
  Administered 2013-09-05: 30 mg via INTRAVENOUS
  Filled 2013-09-05: qty 1

## 2013-09-05 MED ORDER — SUMATRIPTAN SUCCINATE 6 MG/0.5ML ~~LOC~~ SOLN
6.0000 mg | Freq: Once | SUBCUTANEOUS | Status: AC
  Administered 2013-09-05: 6 mg via SUBCUTANEOUS
  Filled 2013-09-05: qty 0.5

## 2013-09-05 MED ORDER — METOCLOPRAMIDE HCL 5 MG/ML IJ SOLN
10.0000 mg | Freq: Once | INTRAMUSCULAR | Status: AC
  Administered 2013-09-05: 10 mg via INTRAVENOUS
  Filled 2013-09-05: qty 2

## 2013-09-05 MED ORDER — GADOBENATE DIMEGLUMINE 529 MG/ML IV SOLN
20.0000 mL | Freq: Once | INTRAVENOUS | Status: AC | PRN
  Administered 2013-09-05: 20 mL via INTRAVENOUS

## 2013-09-05 NOTE — ED Notes (Signed)
MRI notified to call RN when transporter is on way to give anti-anxiety meds. sts appx 30 min

## 2013-09-05 NOTE — ED Notes (Signed)
Spoke to [facility] from MRI- sts appx 1-2 hours wait. Pt updated.

## 2013-09-05 NOTE — ED Provider Notes (Signed)
CSN: [id number]     Arrival date & time 09/05/13  1111 History   First MD Initiated Contact with Patient 09/05/13 1147     Chief Complaint  Patient presents with  . Headache  . Numbness   (Consider location/radiation/quality/duration/timing/severity/associated sxs/prior Treatment) The history is provided by the patient and medical records. No language interpreter was used.    Krystal Ferguson is a 22 y.o. female  with a hx of migraine, PCOS, DM presents to the Emergency Department complaining of gradual, persistent, progressively worsening headache onset last PM.  Pt reports headache last night was generalized in nature and not associated with any other symptoms.  Pt awoke this morning at 7:30 am and realized that her headache was persistent and at that time she has blurry vision in the left and left facial numbness.  Pt denies weakness, numbness, tingling in the LUE or LLE.  Pt took aleve this morning without relief.  Nothing makes it better and light makes it worse.  Pt reports that the headache is located at the base of her head now, rated at a 8/10, non radiating.  Pt denies fever, chills, neck pain, neck stiffness, chest pain, SOB, abd pain, vomiting, diarrhea, weakness, dizziness, syncope, dysuria, hematuria.   Pt reports history of migraine headaches many years ago but does not remember if this is the same.  She denies hx of complex migraine with neurologic symptoms.     Past Medical History  Diagnosis Date  . Polycystic ovaries   . Chest pain   . Diabetes mellitus     pre-diabetic   History reviewed. No pertinent past surgical history. Family History  Problem Relation Age of Onset  . Bladder Cancer Mother   . Kidney disease Father   . Heart disease Maternal Grandmother   . Heart disease Paternal Grandmother    History  Substance Use Topics  . Smoking status: Never Smoker   . Smokeless tobacco: Not on file  . Alcohol Use: Yes     Comment: occassional   OB History   Grav  Para Term Preterm Abortions TAB SAB Ect Mult Living                 Review of Systems  Constitutional: Negative for fever, diaphoresis, appetite change, fatigue and unexpected weight change.  HENT: Negative for mouth sores and neck stiffness.   Eyes: Negative for visual disturbance.  Respiratory: Negative for cough, chest tightness, shortness of breath and wheezing.   Cardiovascular: Negative for chest pain.  Gastrointestinal: Negative for nausea, vomiting, abdominal pain, diarrhea and constipation.  Endocrine: Negative for polydipsia, polyphagia and polyuria.  Genitourinary: Negative for dysuria, urgency, frequency and hematuria.  Musculoskeletal: Negative for back pain.  Skin: Negative for rash.  Allergic/Immunologic: Negative for immunocompromised state.  Neurological: Positive for numbness (L face) and headaches. Negative for syncope and light-headedness.  Hematological: Does not bruise/bleed easily.  Psychiatric/Behavioral: Negative for sleep disturbance. The patient is not nervous/anxious.     Allergies  Review of patient's allergies indicates no known allergies.  Home Medications   Current Outpatient Rx  Name  Route  Sig  Dispense  Refill  . HYDROcodone-acetaminophen (NORCO/VICODIN) 5-325 MG per tablet   Oral   Take 1-2 tablets by mouth every 4 (four) hours as needed for pain.         . LO LOESTRIN FE 1 MG-10 MCG / 10 MCG tablet   Oral   Take 1 tablet by mouth daily.         Marland Kitchen  Multiple Vitamin (MULTIVITAMIN WITH MINERALS) TABS   Oral   Take 1 tablet by mouth daily.          BP 107/57  Pulse 83  Temp(Src) 98.6 F (37 C) (Oral)  Resp 16  Ht 5\' 3"  (1.6 m)  Wt 200 lb (90.719 kg)  BMI 35.44 kg/m2  SpO2 98%  LMP 08/06/2013 Physical Exam  Nursing note and vitals reviewed. Constitutional: She is oriented to person, place, and time. She appears well-developed and well-nourished. No distress.  HENT:  Head: Normocephalic and atraumatic.  Right Ear: Hearing,  tympanic membrane, external ear and ear canal normal.  Left Ear: Hearing, tympanic membrane, external ear and ear canal normal.  Nose: Nose normal. No mucosal edema.  Mouth/Throat: Uvula is midline, oropharynx is clear and moist and mucous membranes are normal. Mucous membranes are not dry. No oropharyngeal exudate, posterior oropharyngeal edema, posterior oropharyngeal erythema or tonsillar abscesses.  Tongue is midline  Eyes: Conjunctivae and EOM are normal. Pupils are equal, round, and reactive to light. Right conjunctiva is not injected. Right conjunctiva has no hemorrhage. Left conjunctiva is not injected. Left conjunctiva has no hemorrhage. No scleral icterus. Right eye exhibits normal extraocular motion and no nystagmus. Left eye exhibits normal extraocular motion and no nystagmus.  Neck: Normal range of motion. Neck supple.  Cardiovascular: Normal rate, regular rhythm, normal heart sounds and intact distal pulses.   No murmur heard. Pulmonary/Chest: Effort normal and breath sounds normal. No respiratory distress. She has no wheezes. She has no rales.  Abdominal: Soft. Bowel sounds are normal. There is no tenderness. There is no rebound and no guarding.  Musculoskeletal: Normal range of motion.  Lymphadenopathy:    She has no cervical adenopathy.  Neurological: She is alert and oriented to person, place, and time. She has normal reflexes. A cranial nerve deficit is present. She exhibits normal muscle tone. Coordination normal.  Speech is clear and goal oriented, follows commands Cranial nerves: II, IV, VI, VII, VIII, IX, XI, XII intact Deficit in V, III with mild facial droop, Pt able to raise eyebrows equally, but unable to keep left eye shut against resistance Normal strength in upper and lower extremities bilaterally, strong and equal grip strength Sensation normal to light and sharp touch Moves extremities without ataxia, coordination intact Normal finger to nose and rapid alternating  movements Neg romberg, no pronator drift Normal gait Normal heel-shin and balance   Skin: Skin is warm and dry. No rash noted. She is not diaphoretic.  Psychiatric: She has a normal mood and affect. Her behavior is normal. Judgment and thought content normal.    ED Course  Procedures (including critical care time) Labs Review Labs Reviewed  CBC - Abnormal; Notable for the following:    Hemoglobin 11.8 (*)    HCT 35.7 (*)    All other components within normal limits  URINALYSIS, ROUTINE W REFLEX MICROSCOPIC - Abnormal; Notable for the following:    APPearance HAZY (*)    Leukocytes, UA MODERATE (*)    All other components within normal limits  URINE MICROSCOPIC-ADD ON - Abnormal; Notable for the following:    Bacteria, UA MANY (*)    All other components within normal limits  URINE CULTURE  GLUCOSE, CAPILLARY  BASIC METABOLIC PANEL  POCT PREGNANCY, URINE   Imaging Review Ct Head Wo Contrast  09/05/2013   CLINICAL DATA:  Headache and numbness  EXAM: CT HEAD WITHOUT CONTRAST  TECHNIQUE: Contiguous axial images were obtained from the base of  the skull through the vertex without intravenous contrast.  COMPARISON:  None.  FINDINGS: Ventricle size is normal. Negative for intracranial hemorrhage or mass. No acute infarct.  4 x 10 mm hypodensity left frontal subcortical white matter. This appears to be a focal brain lesion rather than a deep sulcus. Question chronic infarct or demyelinating disease. No other white matter lesions are identified.  Visualized sinuses are clear. No skull lesion.  IMPRESSION: 4 x 10 mm hypodensity left posterior frontal subcortical white matter. While this could represent a deep sulcus, I would favor this represents a white matter lesion. Differential includes chronic ischemia and demyelinating disease.   Electronically Signed   By: Marlan Palau M.D.   On: 09/05/2013 13:38   Mr Laqueta Jean YN Contrast  09/05/2013   CLINICAL DATA:  Headache. Left sided numbness.   EXAM: MRI HEAD WITHOUT AND WITH CONTRAST  TECHNIQUE: Multiplanar, multiecho pulse sequences of the brain and surrounding structures were obtained according to standard protocol without and with intravenous contrast  CONTRAST:  20mL MULTIHANCE GADOBENATE DIMEGLUMINE 529 MG/ML IV SOLN  COMPARISON:  CT head today.  FINDINGS: Linear hypodensity on CT corresponds to a 4 x 11 mm well-defined hyperintensity in the left frontal parietal subcortical white matter. This has a chronic appearance and may be due to chronic ischemia. Several small white matter hyperintensities are present bilaterally. These are in the deep white matter. Periventricular white matter is normal. The brainstem and cerebellum are normal. Basil ganglia is normal.  Diffusion-weighted imaging is negative for acute infarct. Negative for hemorrhage or mass. Ventricle size is normal. No fluid collection or midline shift. Normal enhancement following contrast infusion. Paranasal sinuses are clear.  IMPRESSION: No acute infarct or mass.  Several tiny white matter hyperintensities bilaterally. Question migraine headache history. Vasculitis and chronic ischemia are other possibilities.  Well-defined hyperintensity left frontal parietal white matter correspond to the CT abnormality and appears chronic. This is most likely due to an area of chronic ischemia.   Electronically Signed   By: Marlan Palau M.D.   On: 09/05/2013 18:13    MDM   1. Complicated migraine   2. Abnormal MRI      Krystal Ferguson presents with headache and left-sided cranial nerve deficit. Head CT with 4 x 10 mm hypodensity in the left posterior frontal subcortical white matter. Discussed with Dr. Marlan Palau who read the CT and he recommends MRI followup.  He is without further neurologic deficit on exam.  UA with evidence of urinary tract infection we'll plan for antibiotics at discharge. Pregnancy Test negative, BMP and CBC unremarkable.  MRI with Several tiny white matter  hyperintensities bilaterally as well as a well-defined hyperintensity left frontal parietal white matter correspond to the CT abnormality and appears chronic.  I personally reviewed the imaging tests through PACS system.  I reviewed available ER/hospitalization records through the EMR.   Pt to be treated with a migraine cocktail and will be d/c to follow-up with PCP and further neurology referral through the PCP.   7:47 PM And given migraine cocktail without significant improvement. Patient then given sumatriptan with significant decrease in headache and resolution of neurologic findings. Patient states she feels much better. Patient will be discharged home with instructions for strict followup with primary care physician.  It has been determined that no acute conditions requiring further emergency intervention are present at this time. The patient/guardian have been advised of the diagnosis and plan. We have discussed signs and symptoms that warrant  return to the ED, such as changes or worsening in symptoms.   Vital signs are stable at discharge.   BP 107/57  Pulse 83  Temp(Src) 98.6 F (37 C) (Oral)  Resp 16  Ht 5\' 3"  (1.6 m)  Wt 200 lb (90.719 kg)  BMI 35.44 kg/m2  SpO2 98%  LMP 08/06/2013  Patient/guardian has voiced understanding and agreed to follow-up with the PCP or specialist.              Dierdre Forth, PA-C 09/05/13 1957

## 2013-09-05 NOTE — ED Notes (Signed)
Pt. Stated, I started having a headache yesterday and this am I had a headache and the left side of my face felt numb.  Also my vision seems blurry at times.

## 2013-09-05 NOTE — ED Provider Notes (Signed)
Medical screening examination/treatment/procedure(s) were performed by non-physician practitioner and as supervising physician I was immediately available for consultation/collaboration.  Doug Sou, MD 09/05/13 2059

## 2013-09-05 NOTE — ED Notes (Signed)
Pt in CT, family updated on plan of care.

## 2013-09-06 LAB — URINE CULTURE

## 2013-09-17 ENCOUNTER — Ambulatory Visit (INDEPENDENT_AMBULATORY_CARE_PROVIDER_SITE_OTHER): Payer: 59 | Admitting: Neurology

## 2013-09-17 ENCOUNTER — Encounter: Payer: Self-pay | Admitting: Neurology

## 2013-09-17 VITALS — BP 100/78 | HR 62 | Temp 98.2°F | Ht 64.0 in | Wt 239.0 lb

## 2013-09-17 DIAGNOSIS — R51 Headache: Secondary | ICD-10-CM

## 2013-09-17 DIAGNOSIS — R519 Headache, unspecified: Secondary | ICD-10-CM

## 2013-09-17 DIAGNOSIS — R9089 Other abnormal findings on diagnostic imaging of central nervous system: Secondary | ICD-10-CM

## 2013-09-17 DIAGNOSIS — R930 Abnormal findings on diagnostic imaging of skull and head, not elsewhere classified: Secondary | ICD-10-CM

## 2013-09-17 LAB — SEDIMENTATION RATE: Sed Rate: 21 mm/hr (ref 0–22)

## 2013-09-17 LAB — C-REACTIVE PROTEIN: CRP: <0.5 mg/dL (ref 0.5–20.0)

## 2013-09-17 MED ORDER — DIAZEPAM 5 MG PO TABS: ORAL_TABLET | ORAL | Status: DC
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
## 2013-09-17 NOTE — Patient Instructions (Addendum)
1.  To investigate the spot on the brain MRI, we will get an MRA of the head (which will look at the blood vessels). 2.  We will also check some blood work 3.  Try Excedrin or Excedrin migraine if you have another severe headache. 4.  For the MRI, we will give you Valium.  Take 1 30 minutes prior to MRI.  May take another right before MRI if needed.  Must have a driver to transport you. 5.  Follow up in one month.  Your MRI is scheduled at [facility] on Thursday, October 9th at 12:00 noon.Please check in at the first floor radiology department one hour prior to your scheduled appointment time. Enter the hospital off of [address] at [address].  You must have a driver and do not take the medication until instructed to do so by the hospital staff.   [phone].

## 2013-09-18 ENCOUNTER — Telehealth: Payer: Self-pay | Admitting: Neurology

## 2013-09-18 NOTE — Telephone Encounter (Signed)
Left a message on mobile phone that recent lab work was normal.

## 2013-09-18 NOTE — Telephone Encounter (Signed)
Message copied by Benay Spice on Fri Sep 18, 2013 12:47 PM ------      Message from: JAFFE, ADAM R      Created: Fri Sep 18, 2013  6:00 AM       Please let Jalayiah know that the blood tests look okay.      ----- Message -----         From: [facility] Interface         Sent: 09/17/2013   3:46 PM           To: Cira Servant, DO                   ------

## 2013-09-24 ENCOUNTER — Other Ambulatory Visit (HOSPITAL_COMMUNITY): Payer: 59

## 2013-09-29 ENCOUNTER — Ambulatory Visit (HOSPITAL_COMMUNITY)
Admission: RE | Admit: 2013-09-29 | Discharge: 2013-09-29 | Disposition: A | Discharge status: Home / Self Care | Payer: 59 | Source: Ambulatory Visit | Attending: Neurology | Admitting: Neurology

## 2013-09-29 DIAGNOSIS — R209 Unspecified disturbances of skin sensation: Secondary | ICD-10-CM | POA: Insufficient documentation

## 2013-09-29 DIAGNOSIS — R9089 Other abnormal findings on diagnostic imaging of central nervous system: Secondary | ICD-10-CM

## 2013-09-29 DIAGNOSIS — R519 Headache, unspecified: Secondary | ICD-10-CM

## 2013-09-29 DIAGNOSIS — R51 Headache: Secondary | ICD-10-CM | POA: Insufficient documentation

## 2013-10-05 ENCOUNTER — Telehealth: Payer: Self-pay | Admitting: Neurology

## 2013-10-05 NOTE — Telephone Encounter (Signed)
Dr. Everlena Cooper, patient requesting MRA results. Please advise.

## 2013-10-05 NOTE — Telephone Encounter (Signed)
Left message for patient to call the office to discuss.

## 2013-10-06 NOTE — Telephone Encounter (Signed)
I called Krystal Ferguson and left a message to call back the office.  We haven't heard from her yet, but she should know that the MRA is normal.

## 2013-10-06 NOTE — Telephone Encounter (Signed)
Left a message for the patient on her mobile number that her MRA was normal.

## 2013-10-21 ENCOUNTER — Ambulatory Visit (INDEPENDENT_AMBULATORY_CARE_PROVIDER_SITE_OTHER): Payer: 59 | Admitting: Family Medicine

## 2013-10-21 VITALS — BP 125/75 | HR 74 | Temp 97.9°F | Resp 18 | Wt 239.0 lb

## 2013-10-21 DIAGNOSIS — R509 Fever, unspecified: Secondary | ICD-10-CM

## 2013-10-21 DIAGNOSIS — R109 Unspecified abdominal pain: Secondary | ICD-10-CM

## 2013-10-21 DIAGNOSIS — J029 Acute pharyngitis, unspecified: Secondary | ICD-10-CM

## 2013-10-21 LAB — POCT URINALYSIS DIPSTICK
Bilirubin, UA: NEGATIVE
Glucose, UA: NEGATIVE
Ketones, UA: NEGATIVE
Leukocytes, UA: NEGATIVE
Nitrite, UA: NEGATIVE
Protein, UA: 30
Spec Grav, UA: 1.015
Urobilinogen, UA: 0.2
pH, UA: 6

## 2013-10-21 LAB — POCT CBC
Granulocyte percent: 25.2 % — AB (ref 37–80)
HCT, POC: 41.7 % (ref 37.7–47.9)
Hemoglobin: 12.7 g/dL (ref 12.2–16.2)
Lymph, poc: 6.3 — AB (ref 0.6–3.4)
MCH, POC: 27.7 pg (ref 27–31.2)
MCHC: 30.5 g/dL — AB (ref 31.8–35.4)
MCV: 91 fL (ref 80–97)
MID (cbc): 1.5 — AB (ref 0–0.9)
MPV: 12.5 fL (ref 0–99.8)
POC Granulocyte: 2.6 (ref 2–6.9)
POC LYMPH PERCENT: 60.5 %L — AB (ref 10–50)
POC MID %: 14.3 % — AB (ref 0–12)
Platelet Count, POC: 163 10*3/uL (ref 142–424)
RBC: 4.58 M/uL (ref 4.04–5.48)
RDW, POC: 14.7 %
WBC: 10.4 10*3/uL — AB (ref 4.6–10.2)

## 2013-10-21 LAB — POCT UA - MICROSCOPIC ONLY
Bacteria, U Microscopic: NEGATIVE
Casts, Ur, LPF, POC: NEGATIVE
Crystals, Ur, HPF, POC: NEGATIVE
Mucus, UA: NEGATIVE
Yeast, UA: NEGATIVE

## 2013-10-21 LAB — COMPREHENSIVE METABOLIC PANEL WITH GFR
ALT: 256 U/L — ABNORMAL HIGH (ref 0–35)
AST: 217 U/L — ABNORMAL HIGH (ref 0–37)
Alkaline Phosphatase: 118 U/L — ABNORMAL HIGH (ref 39–117)
BUN: 9 mg/dL (ref 6–23)
Chloride: 102 meq/L (ref 96–112)
Creat: 1.04 mg/dL (ref 0.50–1.10)
Total Bilirubin: 0.4 mg/dL (ref 0.3–1.2)

## 2013-10-21 LAB — COMPREHENSIVE METABOLIC PANEL
Albumin: 3.6 g/dL (ref 3.5–5.2)
CO2: 29 mEq/L (ref 19–32)
Calcium: 8.7 mg/dL (ref 8.4–10.5)
Glucose, Bld: 78 mg/dL (ref 70–99)
Potassium: 3.6 mEq/L (ref 3.5–5.3)
Sodium: 140 mEq/L (ref 135–145)
Total Protein: 7.4 g/dL (ref 6.0–8.3)

## 2013-10-21 LAB — POCT RAPID STREP A (OFFICE): Rapid Strep A Screen: NEGATIVE

## 2013-10-21 MED ORDER — MAGIC MOUTHWASH W/LIDOCAINE: 5.0000 mL | Freq: Three times a day (TID) | ORAL | Status: DC | PRN

## 2013-10-21 MED ORDER — AMOXICILLIN 875 MG PO TABS: 875.0000 mg | ORAL_TABLET | Freq: Two times a day (BID) | ORAL | Status: DC

## 2013-10-21 NOTE — Patient Instructions (Signed)
Abdominal Pain (Nonspecific) Your exam might not show the exact reason you have abdominal pain. Since there are many different causes of abdominal pain, another checkup and more tests may be needed. It is very important to follow up for lasting (persistent) or worsening symptoms. A possible cause of abdominal pain in any person who still has his or her appendix is acute appendicitis. Appendicitis is often hard to diagnose. Normal blood tests, urine tests, ultrasound, and CT scans do not completely rule out early appendicitis or other causes of abdominal pain. Sometimes, only the changes that happen over time will allow appendicitis and other causes of abdominal pain to be determined. Other potential problems that may require surgery may also take time to become more apparent. Because of this, it is important that you follow all of the instructions below. HOME CARE INSTRUCTIONS   Rest as much as possible.  Do not eat solid food until your pain is gone.  While adults or children have pain: A diet of water, weak decaffeinated tea, broth or bouillon, gelatin, oral rehydration solutions (ORS), frozen ice pops, or ice chips may be helpful.  When pain is gone in adults or children: Start a light diet (dry toast, crackers, applesauce, or white rice). Increase the diet slowly as long as it does not bother you. Eat no dairy products (including cheese and eggs) and no spicy, fatty, fried, or high-fiber foods.  Use no alcohol, caffeine, or cigarettes.  Take your regular medicines unless your caregiver told you not to.  Take any prescribed medicine as directed.  Only take over-the-counter or prescription medicines for pain, discomfort, or fever as directed by your caregiver. Do not give aspirin to children. If your caregiver has given you a follow-up appointment, it is very important to keep that appointment. Not keeping the appointment could result in a permanent injury and/or lasting (chronic) pain and/or  disability. If there is any problem keeping the appointment, you must call to reschedule.  SEEK IMMEDIATE MEDICAL CARE IF:   Your pain is not gone in 24 hours.  Your pain becomes worse, changes location, or feels different.  You or your child has an oral temperature above 102 F (38.9 C), not controlled by medicine.  Your baby is older than 3 months with a rectal temperature of 102 F (38.9 C) or higher.  Your baby is 2 months old or younger with a rectal temperature of 100.4 F (38 C) or higher.  You have shaking chills.  You keep throwing up (vomiting) or cannot drink liquids.  There is blood in your vomit or you see blood in your bowel movements.  Your bowel movements become dark or black.  You have frequent bowel movements.  Your bowel movements stop (become blocked) or you cannot pass gas.  You have bloody, frequent, or painful urination.  You have yellow discoloration in the skin or whites of the eyes.  Your stomach becomes bloated or bigger.  You have dizziness or fainting.  You have chest or back pain. MAKE SURE YOU:   Understand these instructions.  Will watch your condition.  Will get help right away if you are not doing well or get worse. Document Released: 12/03/2005 Document Revised: 02/25/2012 Document Reviewed: 10/31/2009 [facility] Patient Information 2014 [place], [place]. Viral and Bacterial Pharyngitis Pharyngitis is soreness (inflammation) or infection of the pharynx. It is also called a sore throat. CAUSES  Most sore throats are caused by viruses and are part of a cold. However, some sore throats are  caused by strep and other bacteria. Sore throats can also be caused by post nasal drip from draining sinuses, allergies and sometimes from sleeping with an open mouth. Infectious sore throats can be spread from person to person by coughing, sneezing and sharing cups or eating utensils. TREATMENT  Sore throats that are viral usually last 3-4 days. Viral  illness will get better without medications (antibiotics). Strep throat and other bacterial infections will usually begin to get better about 24-48 hours after you begin to take antibiotics. HOME CARE INSTRUCTIONS   If the caregiver feels there is a bacterial infection or if there is a positive strep test, they will prescribe an antibiotic. The full course of antibiotics must be taken. If the full course of antibiotic is not taken, you or your child may become ill again. If you or your child has strep throat and do not finish all of the medication, serious heart or kidney diseases may develop.  Drink enough water and fluids to keep your urine clear or pale yellow.  Only take over-the-counter or prescription medicines for pain, discomfort or fever as directed by your caregiver.  Get lots of rest.  Gargle with salt water ( tsp. of salt in a glass of water) as often as every 1-2 hours as you need for comfort.  Hard candies may soothe the throat if individual is not at risk for choking. Throat sprays or lozenges may also be used. SEEK MEDICAL CARE IF:   Large, tender lumps in the neck develop.  A rash develops.  Green, yellow-brown or bloody sputum is coughed up.  Your baby is older than 3 months with a rectal temperature of 100.5 F (38.1 C) or higher for more than 1 day. SEEK IMMEDIATE MEDICAL CARE IF:   A stiff neck develops.  You or your child are drooling or unable to swallow liquids.  You or your child are vomiting, unable to keep medications or liquids down.  You or your child has severe pain, unrelieved with recommended medications.  You or your child are having difficulty breathing (not due to stuffy nose).  You or your child are unable to fully open your mouth.  You or your child develop redness, swelling, or severe pain anywhere on the neck.  You have a fever.  Your baby is older than 3 months with a rectal temperature of 102 F (38.9 C) or higher.  Your baby is 14  months old or younger with a rectal temperature of 100.4 F (38 C) or higher. MAKE SURE YOU:   Understand these instructions.  Will watch your condition.  Will get help right away if you are not doing well or get worse. Document Released: 12/03/2005 Document Revised: 02/25/2012 Document Reviewed: 03/01/2008 [facility] Patient Information 2014 [place], [place].

## 2013-10-21 NOTE — Progress Notes (Signed)
[facility]:  Office Visit  Chief Complaint:  Chief Complaint  Patient presents with  . Sore Throat  . Abdominal Pain    HPI: Krystal Ferguson is a 22 y.o. female who is here for  sorethroat x 6 days, associated with dysphagia and fever, and also nausea and diffuse abd pain.  She is on OCP and is not pregnant. She has had sore throat before 2 months. T max 102, tried PCN last time and had left over . 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 She ahs taken left over PCN but never had problems before, she has taken motrin as well.   Past Medical History  Diagnosis Date  . Polycystic ovaries   . Chest pain   . Diabetes mellitus     pre-diabetic  . Migraine    No past surgical history on file. History   Social History  . Marital Status: Single    Spouse Name: N/A    Number of Children: N/A  . Years of Education: N/A   Social History Main Topics  . Smoking status: Never Smoker   . Smokeless tobacco: Never Used  . Alcohol Use: Yes     Comment: occassional  . Drug Use: No  . Sexual Activity: No   Other Topics Concern  . None   Social History Narrative  . None   Family History  Problem Relation Age of Onset  . Bladder Cancer Mother   . Kidney disease Father   . Heart disease Maternal Grandmother   . Heart disease Paternal Grandmother    No Known Allergies Prior to Admission medications   Medication Sig Start Date End Date Taking? Authorizing Provider  LO LOESTRIN FE 1 MG-10 MCG / 10 MCG tablet Take 1 tablet by mouth daily. 08/21/13  Yes Historical Provider, MD  Multiple Vitamin (MULTIVITAMIN WITH MINERALS) TABS Take 1 tablet by mouth daily.   Yes Historical Provider, MD  HYDROcodone-acetaminophen (NORCO/VICODIN) 5-325 MG per tablet Take 1-2 tablets by mouth every 4 (four) hours as needed for pain.    Historical Provider, MD     ROS: The patient denies fevers, chills, night sweats, unintentional weight loss, chest pain,  palpitations, wheezing, dyspnea on exertion, nausea, vomiting, dysuria, hematuria, melena, numbness, weakness, or tingling.   All other systems have been reviewed and were otherwise negative with the exception of those mentioned in the HPI and as above.    PHYSICAL EXAM: Filed Vitals:   10/21/13 1246  BP: 125/75  Pulse: 74  Temp: 97.9 F (36.6 C)  Resp: 18   Filed Vitals:   10/21/13 1246  Weight: 239 lb (108.41 kg)   Body mass index is 41 kg/(m^2).  General: Alert, no acute distress,morbidly  obese white female HEENT:  Normocephalic, atraumatic, oropharynx patent. EOMI, PERRLA, + erythematous tonsils, no exudates. TM nl Cardiovascular:  Regular rate and rhythm, no rubs murmurs or gallops.  No Carotid bruits, radial pulse intact. No pedal edema.  Respiratory: Clear to auscultation bilaterally.  No wheezes, rales, or rhonchi.  No cyanosis, no use of accessory musculature GI: No organomegaly, abdomen is soft and non-tender, positive bowel sounds.  No masses. Skin: No rashes. Neurologic: Facial musculature symmetric. Psychiatric: Patient is appropriate throughout our interaction. Lymphatic: No cervical lymphadenopathy appreciated Musculoskeletal: Gait intact.   LABS: Results for orders placed in visit on 10/21/13  POCT RAPID STREP A (OFFICE)      Result Value Range   Rapid Strep A  Screen Negative  Negative  POCT CBC      Result Value Range   WBC 10.4 (*) 4.6 - 10.2 K/uL   Lymph, poc 6.3 (*) 0.6 - 3.4   POC LYMPH PERCENT 60.5 (*) 10 - 50 %L   MID (cbc) 1.5 (*) 0 - 0.9   POC MID % 14.3 (*) 0 - 12 %M   POC Granulocyte 2.6  2 - 6.9   Granulocyte percent 25.2 (*) 37 - 80 %G   RBC 4.58  4.04 - 5.48 M/uL   Hemoglobin 12.7  12.2 - 16.2 g/dL   HCT, POC 78.2  95.6 - 47.9 %   MCV 91.0  80 - 97 fL   MCH, POC 27.7  27 - 31.2 pg   MCHC 30.5 (*) 31.8 - 35.4 g/dL   RDW, POC 21.3     Platelet Count, POC 163  142 - 424 K/uL   MPV 12.5  0 - 99.8 fL     EKG/XRAY:   Primary read  interpreted by Dr. Conley Rolls at [facility].   ASSESSMENT/PLAN: Encounter Diagnoses  Name Primary?  . Sore throat Yes  . Abdominal pain   . Fever    ? Recurrent bacterial/viral  tonsillitis vs viral URI syndrome  in origin CBC with lymphocytic shift, possible EBV She has taken left over pcn  Without rash and without improvement Will give amox and will await for throat cx Gross sideeffects, risk and benefits, and alternatives of medications d/w patient. Patient is aware that all medications have potential sideeffects and we are unable to predict every sideeffect or drug-drug interaction that may occur.  Krystal Barbian PHUONG, DO 10/21/2013 3:14 PM   LM at 4:05 for patient to stop taking Amox, her EBV IgM titers were elevated and liver enzymes elevated, she has acute infectious mono She needs to follow-up for repeat liver enzymes in 2 weeks Avoid contact sports d/to possible spelnic rupture She has my cell phone f she wants to call me back.  10/23/13 She returned my call and would like to a medicine for throat pain, I will try giving her some cough syrup with hydrocodone. We talked about steroid taper but she does not like taking it because it makes her gain weight. She got a troadol injection yesterday 60 mg and still has throat pain. 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 She should also return for repeat labs due to elevated liver enzymes in 2 weeks to see if it is trending down.

## 2013-10-22 ENCOUNTER — Ambulatory Visit (INDEPENDENT_AMBULATORY_CARE_PROVIDER_SITE_OTHER): Payer: 59 | Admitting: Neurology

## 2013-10-22 ENCOUNTER — Telehealth: Payer: Self-pay | Admitting: Neurology

## 2013-10-22 ENCOUNTER — Encounter: Payer: Self-pay | Admitting: Neurology

## 2013-10-22 ENCOUNTER — Other Ambulatory Visit: Payer: Self-pay | Admitting: Neurology

## 2013-10-22 VITALS — BP 110/66 | HR 80 | Temp 97.7°F | Ht 63.0 in | Wt 239.0 lb

## 2013-10-22 DIAGNOSIS — H538 Other visual disturbances: Secondary | ICD-10-CM

## 2013-10-22 DIAGNOSIS — R200 Anesthesia of skin: Secondary | ICD-10-CM

## 2013-10-22 DIAGNOSIS — R51 Headache: Secondary | ICD-10-CM

## 2013-10-22 DIAGNOSIS — R9389 Abnormal findings on diagnostic imaging of other specified body structures: Secondary | ICD-10-CM

## 2013-10-22 DIAGNOSIS — R930 Abnormal findings on diagnostic imaging of skull and head, not elsewhere classified: Secondary | ICD-10-CM

## 2013-10-22 LAB — EPSTEIN-BARR VIRUS VCA, IGM: EBV VCA IgM: >160 U/mL — ABNORMAL HIGH (ref <36.0)

## 2013-10-22 MED ORDER — KETOROLAC TROMETHAMINE 60 MG/2ML IM SOLN
60.0000 mg | Freq: Once | INTRAMUSCULAR | Status: AC
  Administered 2013-10-22: 60 mg via INTRAMUSCULAR

## 2013-10-22 MED ORDER — TOPIRAMATE 50 MG PO TABS: ORAL_TABLET | ORAL | Status: DC

## 2013-10-22 NOTE — Patient Instructions (Signed)
I think the headaches are likely benign and I feel that the MRI findings are not worrisome, but since these headaches are new and persistent, I would like to get a spinal tap.  We will start topamax 25mg  at bedtime for one week, then increase to 50mg  at bedtime.  Limit pain medication, such as Excedrin, tylenol and Advil to no more than 2 days out of the week.  We will give you a toradol injection to help break the headache.  Follow up in 4 weeks.

## 2013-10-22 NOTE — Telephone Encounter (Signed)
Left a message for the patient or her mom to call me back to confirm receipt of my message re: LP scheduled for Thursday, Nov. 20th at 10:00 am at [facility]; to arrive at 9:00 am [facility]. NPO after midnight prior to the procedure and she must have a driver to get her home.

## 2013-10-22 NOTE — Progress Notes (Signed)
NEUROLOGY FOLLOW UP OFFICE NOTE  [facility] [id number]  HISTORY OF PRESENT ILLNESS: Krystal Ferguson is a 22 year old right-handed woman with PCOS and diabetes mellitus who follows up for headache and abnormal MRI.  She is with her mother.  Records and images were personally reviewed where available.    She presented to the ED on 09/05/13.  She woke up at 6:30 am with severe bi-occipital headache, sharp and non-throbbing, 9/10 intensity.  Headache is not positional with no triggers or relieving factors.  She noted accompanying transient decreased vision in left eye, sometimes tunnel vision.  She also noted left facial numbness and her mom noted some mild paralysis of the left side of her face.  This was not observed as per the ED notes.  There was photophobia, but no nausea, phonophobia or phonophobia.  It was constant and unchanged all day, despite Advil, so she went to the ED.  She underwent an extensive workup in the ED: CT Head:    4 x 10 mm hypodensity left posterior frontal subcortical white matter.   MRI Brain w/wo:  Well-defined hyperintensity left frontal parietal white matter correspond to the CT abnormality and appears chronic. 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  I did review this with the neuroradiologist, and he did agree that it may be a perivascular space.  However, there are also a couple of nonspecific punctate hyperintensities of uncertain significance.  To investigate this further, I ordered an MRA of the head, which was normal.  ESR and CRP were unremarkable.  She continues to have daily headaches.  She will still have occasional numbness in her left cheek.  She still will have transient blurred vision, but no vision loss.  Headaches are about 7/10.  They are constant but can fluctutate.  She takes two Excedrin a day, which helps for 1-1.5 hours.    Her mom does report that she has remote history of severe migraines  as a young child, which Anahla does not remember.  Recently, she has been having a sore throat and abdominal pain and has seen her PCP yesterday.  PAST MEDICAL HISTORY: Past Medical History  Diagnosis Date  . Polycystic ovaries   . Chest pain   . Diabetes mellitus     pre-diabetic  . Migraine     MEDICATIONS: Current Outpatient Prescriptions on File Prior to Visit  Medication Sig Dispense Refill  . Alum & Mag Hydroxide-Simeth (MAGIC MOUTHWASH W/LIDOCAINE) SOLN Take 5 mLs by mouth 3 (three) times daily as needed for mouth pain.  120 mL  0  . amoxicillin (AMOXIL) 875 MG tablet Take 1 tablet (875 mg total) by mouth 2 (two) times daily.  20 tablet  0  . LO LOESTRIN FE 1 MG-10 MCG / 10 MCG tablet Take 1 tablet by mouth daily.      . Multiple Vitamin (MULTIVITAMIN WITH MINERALS) TABS Take 1 tablet by mouth daily.       No current facility-administered medications on file prior to visit.    ALLERGIES: No Known Allergies  FAMILY HISTORY: Family History  Problem Relation Age of Onset  . Bladder Cancer Mother   . Kidney disease Father   . Heart disease Maternal Grandmother   . Heart disease Paternal Grandmother     SOCIAL HISTORY: History   Social History  . Marital Status: Single    Spouse Name: N/A    Number of Children: N/A  .  Years of Education: N/A   Occupational History  . Not on file.   Social History Main Topics  . Smoking status: Never Smoker   . Smokeless tobacco: Never Used  . Alcohol Use: Yes     Comment: occassional  . Drug Use: No  . Sexual Activity: No   Other Topics Concern  . Not on file   Social History Narrative  . No narrative on file    REVIEW OF SYSTEMS: Constitutional: No fevers, chills, or sweats, no generalized fatigue, change in appetite Eyes: No visual changes, double vision, eye pain Ear, nose and throat: No hearing loss, ear pain, nasal congestion, sore throat Cardiovascular: No chest pain, palpitations Respiratory:  No  shortness of breath at rest or with exertion, wheezes GastrointestinaI: No nausea, vomiting, diarrhea, abdominal pain, fecal incontinence Genitourinary:  No dysuria, urinary retention or frequency Musculoskeletal:  No neck pain, back pain Integumentary: No rash, pruritus, skin lesions Neurological: as above Psychiatric: No depression, insomnia, anxiety Endocrine: No palpitations, fatigue, diaphoresis, mood swings, change in appetite, change in weight, increased thirst Hematologic/Lymphatic:  No anemia, purpura, petechiae. Allergic/Immunologic: no itchy/runny eyes, nasal congestion, recent allergic reactions, rashes  PHYSICAL EXAM: Filed Vitals:   10/22/13 0833  BP: 110/66  Pulse: 80  Temp: 97.7 F (36.5 C)   General: No acute distress Head:  Normocephalic/atraumatic Neck: supple, no paraspinal tenderness, full range of motion Heart:  Regular rate and rhythm Lungs:  Clear to auscultation bilaterally Back: No paraspinal tenderness Neurological Exam: alert and oriented to person, place, and time. Speech fluent and not dysarthric, language intact.  CN II-XII intact. Fundoscopic exam unremarkable, no papilledema.  Bulk and tone normal, muscle strength 5/5 throughout.  Sensation to light touch, temperature and vibration intact.  Deep tendon reflexes 2+ throughout, toes downgoing.  Finger to nose testing intact.  Gait normal, Romberg negative.  IMPRESSION: Atypical headache, may have been migraine variant with aura.  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  There are also, however some nonspecific punctate signal abnormalities.  It is of uncertain significance, and often seen in presence of people with history of migraine.  However differential does include a demyelinating disease or vasculitis.  PLAN: Because these are new-onset headaches, that are constant and with atypical presentation, as well as the abnormal  MRI, we will perform further testing while we attempt to treat and manage the headache. 1.  To further investigate, we will perform a lumbar puncture.  Check CSF for opening pressure, cell count w/diff, protein, glucose, IgG index, oligoclonal bands, Lyme antibody index, cryptococcal antigen, acid fast bacillus, cytospin and cytology 2. We will start topamax 25mg  qhs and increase to 50mg  qhs after one week.  Side effects discussed. 3. We will administer Toradol 60mg  IM today to help break the headache. 4. Limit use of Excedrin or other pain relievers to no more than 2 days out of the week. 5. Follow up in 4 weeks.  Shon Millet, DO  CC:  Lupita Raider, MD

## 2013-10-22 NOTE — Telephone Encounter (Signed)
LM about EBV IgM being high, stop amoxacillin, liver enzymes high, needs to come back for repeat labs in 2 weeks, no contact sports, she can call me with questions [phone]

## 2013-10-23 LAB — CULTURE, GROUP A STREP: Organism ID, Bacteria: NORMAL

## 2013-10-23 MED ORDER — HYDROCODONE-HOMATROPINE 5-1.5 MG/5ML PO SYRP: 5.0000 mL | ORAL_SOLUTION | Freq: Four times a day (QID) | ORAL | Status: DC | PRN

## 2013-10-23 NOTE — Telephone Encounter (Signed)
Left a message on the patient's mobile number to return my call.

## 2013-10-24 ENCOUNTER — Emergency Department (HOSPITAL_COMMUNITY)
Admission: EM | Admit: 2013-10-24 | Discharge: 2013-10-25 | Disposition: A | Discharge status: Home / Self Care | Payer: 59 | Attending: Emergency Medicine | Admitting: Emergency Medicine

## 2013-10-24 ENCOUNTER — Encounter (HOSPITAL_COMMUNITY): Payer: Self-pay | Admitting: Emergency Medicine

## 2013-10-24 DIAGNOSIS — Z792 Long term (current) use of antibiotics: Secondary | ICD-10-CM | POA: Insufficient documentation

## 2013-10-24 DIAGNOSIS — Z8639 Personal history of other endocrine, nutritional and metabolic disease: Secondary | ICD-10-CM | POA: Insufficient documentation

## 2013-10-24 DIAGNOSIS — Z79899 Other long term (current) drug therapy: Secondary | ICD-10-CM | POA: Insufficient documentation

## 2013-10-24 DIAGNOSIS — J029 Acute pharyngitis, unspecified: Secondary | ICD-10-CM | POA: Insufficient documentation

## 2013-10-24 DIAGNOSIS — Z862 Personal history of diseases of the blood and blood-forming organs and certain disorders involving the immune mechanism: Secondary | ICD-10-CM | POA: Insufficient documentation

## 2013-10-24 DIAGNOSIS — G43909 Migraine, unspecified, not intractable, without status migrainosus: Secondary | ICD-10-CM | POA: Insufficient documentation

## 2013-10-24 MED ORDER — OXYCODONE-ACETAMINOPHEN 5-325 MG PO TABS: 1.0000 | ORAL_TABLET | ORAL | Status: DC | PRN

## 2013-10-24 MED ORDER — MORPHINE SULFATE 4 MG/ML IJ SOLN
6.0000 mg | Freq: Once | INTRAMUSCULAR | Status: AC
  Administered 2013-10-24: 6 mg via INTRAMUSCULAR
  Filled 2013-10-24: qty 2

## 2013-10-24 MED ORDER — DEXAMETHASONE SODIUM PHOSPHATE 10 MG/ML IJ SOLN: 10.0000 mg | Freq: Once | INTRAMUSCULAR | Status: DC

## 2013-10-24 MED ORDER — DEXAMETHASONE SODIUM PHOSPHATE 10 MG/ML IJ SOLN
10.0000 mg | Freq: Once | INTRAMUSCULAR | Status: AC
  Administered 2013-10-24: 10 mg via INTRAMUSCULAR
  Filled 2013-10-24: qty 1

## 2013-10-24 MED ORDER — PREDNISONE 20 MG PO TABS: 40.0000 mg | ORAL_TABLET | Freq: Every day | ORAL | Status: DC

## 2013-10-24 NOTE — ED Provider Notes (Signed)
CSN: [id number]     Arrival date & time 10/24/13  2101 History   First MD Initiated Contact with Patient 10/24/13 2235     Chief Complaint  Patient presents with  . Sore Throat   (Consider location/radiation/quality/duration/timing/severity/associated sxs/prior Treatment) HPI Krystal Ferguson is a 22 y.o. female who presents to emergency department complaining of sore throat. Patient states she has had sore throat for 4 days. She was seen by a primary care Dr. 3 days ago and was called yesterday and was told that her Monospot came back positive. States she was started initially amoxicillin but was told to stop. She was given Magic mouthwash to use, and she was given hydrocodone syrup for pain, and states she states that she is doing everything but nothing is helping her pain. Patient states that she felt like her throat was closing up early and she was having trouble breathing. She denies any breathing difficulties at present. Patient also states she feels like "I'm starting to have pain in the left upper abdomen, with Dr. June Leap the spleen was enlarged." Patient does state that she did not have any imaging done in that she did not have any abdominal injuries. Patient denies any fever, chills. Patient denies any difficulty swallowing. States it is painful to swallow solids but she is able to keep down liquids. She denies any nausea, vomiting, chest pain, abdominal pain.  Past Medical History  Diagnosis Date  . Polycystic ovaries   . Chest pain   . Migraine    History reviewed. No pertinent past surgical history. Family History  Problem Relation Age of Onset  . Bladder Cancer Mother   . Kidney disease Father   . Heart disease Maternal Grandmother   . Heart disease Paternal Grandmother    History  Substance Use Topics  . Smoking status: Never Smoker   . Smokeless tobacco: Never Used  . Alcohol Use: Yes     Comment: occassional   OB History   Grav Para Term Preterm Abortions TAB SAB  Ect Mult Living                 Review of Systems  Constitutional: Negative for fever and chills.  HENT: Positive for sore throat. Negative for dental problem, ear pain, facial swelling, mouth sores, trouble swallowing and voice change.   Respiratory: Negative for cough, chest tightness and shortness of breath.   Cardiovascular: Negative for chest pain, palpitations and leg swelling.  Gastrointestinal: Negative for nausea, vomiting, abdominal pain and diarrhea.  Musculoskeletal: Negative for neck pain and neck stiffness.  Skin: Negative for rash.  Neurological: Negative for dizziness, weakness and headaches.  All other systems reviewed and are negative.    Allergies  Review of patient's allergies indicates no known allergies.  Home Medications   Current Outpatient Rx  Name  Route  Sig  Dispense  Refill  . Diphenhyd-Lidocaine-Nystatin (FIRST-BXN MOUTHWASH) SUSP   Swish & Spit   Swish and spit 5 mLs 3 (three) times daily as needed (mouth pain).          Marland Kitchen HYDROcodone-homatropine (HYCODAN) 5-1.5 MG/5ML syrup   Oral   Take 5 mLs by mouth every 6 (six) hours as needed for cough. May make you drowsy, do not drive/operate heavy machinary   180 mL   0   . ibuprofen (ADVIL,MOTRIN) 200 MG tablet   Oral   Take 600 mg by mouth every 8 (eight) hours as needed for headache or moderate pain.         Marland Kitchen  LO LOESTRIN FE 1 MG-10 MCG / 10 MCG tablet   Oral   Take 1 tablet by mouth every morning.          . Multiple Vitamin (MULTIVITAMIN WITH MINERALS) TABS   Oral   Take 1 tablet by mouth every morning.          . topiramate (TOPAMAX) 50 MG tablet   Oral   Take 25 mg by mouth at bedtime. Titrate up to 1 tablet at bedtime after 1 week         . Alum & Mag Hydroxide-Simeth (MAGIC MOUTHWASH W/LIDOCAINE) SOLN   Oral   Take 5 mLs by mouth 3 (three) times daily as needed for mouth pain.   120 mL   0   . amoxicillin (AMOXIL) 875 MG tablet   Oral   Take 1 tablet by mouth 2 (two)  times daily.          BP 122/77  Pulse 113  Temp(Src) 98.7 F (37.1 C) (Oral)  Resp 18  Ht 5\' 3"  (1.6 m)  Wt 220 lb (99.791 kg)  BMI 38.98 kg/m2  SpO2 95%  LMP 10/07/2013 Physical Exam  Nursing note and vitals reviewed. Constitutional: She is oriented to person, place, and time. She appears well-developed and well-nourished. No distress.  HENT:  Head: Normocephalic and atraumatic.  Right Ear: Tympanic membrane and external ear normal.  Left Ear: Tympanic membrane, external ear and ear canal normal.  Nose: Nose normal.  Mouth/Throat: Uvula is midline and mucous membranes are normal. No oral lesions. No uvula swelling. No tonsillar abscesses.  Tonsils enlarged, white exudate present.  Eyes: Conjunctivae are normal.  Neck: Normal range of motion. Neck supple.  Cardiovascular: Normal rate, regular rhythm and normal heart sounds.   Pulmonary/Chest: Effort normal and breath sounds normal. No respiratory distress. She has no wheezes. She has no rales.  Abdominal: Soft. Bowel sounds are normal. She exhibits no distension. There is no tenderness. There is no rebound.  Abdomen is obese, no masses palpated  Musculoskeletal: She exhibits no edema.  Lymphadenopathy:    She has cervical adenopathy.  Neurological: She is alert and oriented to person, place, and time.  Skin: Skin is warm and dry.    ED Course  Procedures (including critical care time) Labs Review Labs Reviewed - No data to display Imaging Review No results found.  EKG Interpretation   None       MDM   1. Pharyngitis     Patient developed severe sharp throat. On exam she does not appear to have any respiratory difficulties. Her tonsils are slightly enlarged but they're not touching and she has good air movement. No signs of peritonsillar abscess. She is able to swallow liquids. She is already taking hydrocodone and has been sick mouthwash. I have given her a shot of Decadron emergency department for the edema.  Morphine IM for pain. Will give few percocet pills for severe pain and pack of prednisone. Follow up with PCP.   Filed Vitals:   10/24/13 2122  BP: 122/77  Pulse: 113  Temp: 98.7 F (37.1 C)  TempSrc: Oral  Resp: 18  Height: 5\' 3"  (1.6 m)  Weight: 220 lb (99.791 kg)  SpO2: 95%      Lottie Mussel, PA-C 10/25/13 0042

## 2013-10-24 NOTE — ED Notes (Signed)
Pt arrived to Ed with a complaint of a sore throat from Mono that is causing the patient to have difficulty breathing.  Pt is able to verbalize complaint and appears in no apparent distress.

## 2013-10-25 NOTE — Discharge Instructions (Signed)
Continue using magic mouth was. You can also do salt water gargles, these can reduce the swelling. Take prednisone for swelling as prescribed starting tomorrow. Percocet for severe pain. Do not take at the same time as hydrocodone. Follow up as needed. Return if worsening.    Infectious Mononucleosis Infectious mononucleosis (mono) is a common germ (viral) infection in children, teenagers, and young adults.  CAUSES  Mono is an infection caused by the Malachi Carl virus. The virus is spread by close personal contact with someone who has the infection. It can be passed by contact with your saliva through things such as kissing or sharing drinking glasses. Sometimes, the infection can be spread from someone who does not appear sick but still spreads the virus (asymptomatic carrier state).  SYMPTOMS  The most common symptoms of Mono are:  Sore throat.  Headache.  Fatigue.  Muscle aches.  Swollen glands.  Fever.  Poor appetite.  Enlarged liver or spleen. The less common symptoms can include:  Rash.  Feeling sick to your stomach (nauseous).  Abdominal pain. DIAGNOSIS  Mono is diagnosed by a blood test.  TREATMENT  Treatment of mono is usually at home. There is no medicine that cures this virus. Sometimes hospital treatment is needed in severe cases. Steroid medicine sometimes is needed if the swelling in the throat causes breathing or swallowing problems.  HOME CARE INSTRUCTIONS   Drink enough fluids to keep your urine clear or pale yellow.  Eat soft foods. Cool foods like popsicles or ice cream can soothe a sore throat.  Only take over-the-counter or prescription medicines for pain, discomfort, or fever as directed by your caregiver. Children under 37 years of age should not take aspirin.  Gargle salt water. This may help relieve your sore throat. Put 1 teaspoon (tsp) of salt in 1 cup of warm water. Sucking on hard candy may also help.  Rest as needed.  Start regular  activities gradually after the fever is gone. Be sure to rest when tired.  Avoid strenuous exercise or contact sports until your caregiver says it is okay. The liver and spleen could be seriously injured.  Avoid sharing drinking glasses or kissing until your caregiver tells you that you are no longer contagious. SEEK MEDICAL CARE IF:   Your fever is not gone after 7 days.  Your activity level is not back to normal after 2 weeks.  You have yellow coloring to eyes and skin (jaundice). SEEK IMMEDIATE MEDICAL CARE IF:   You have severe pain in the abdomen or shoulder.  You have trouble swallowing or drooling.  You have trouble breathing.  You develop a stiff neck.  You develop a severe headache.  You cannot stop throwing up (vomiting).  You have convulsions.  You are confused.  You have trouble with balance.  You develop signs of body fluid loss (dehydration):  Weakness.  Sunken eyes.  Pale skin.  Dry mouth.  Rapid breathing or pulse. MAKE SURE YOU:   Understand these instructions.  Will watch your condition.  Will get help right away if you are not doing well or get worse. Document Released: 11/30/2000 Document Revised: 02/25/2012 Document Reviewed: 09/28/2008 [facility] Patient Information 2014 [place], [place].

## 2013-10-25 NOTE — ED Provider Notes (Signed)
Medical screening examination/treatment/procedure(s) were performed by non-physician practitioner and as supervising physician I was immediately available for consultation/collaboration.  EKG Interpretation   None         Audree Camel, MD 10/25/13 1051

## 2013-10-26 NOTE — Telephone Encounter (Signed)
We can still schedule the LP.

## 2013-10-26 NOTE — Telephone Encounter (Signed)
Spoke with the patient's mom. She states she and her daughter got my message re: sponal tap. She also tells me that Lamyra was dx last Thursday with mono. She wanted to know if that would be a problem for her going forward with the spinal tap. I told her I would check with Dr. Everlena Cooper and get his thoughts on that and get back with her. She is fone with this plan. **Dr. Everlena Cooper, ok to go ahead with the LP on 11/20 even though she has mono?

## 2013-10-26 NOTE — Telephone Encounter (Signed)
Left the patient's mom a message stating ok to go forward with the LP. Asked that she call me and let me know she got this message.

## 2013-10-27 NOTE — Telephone Encounter (Signed)
Spoke with the patient's mother, Rosey Bath. Aware of appointment date and time for the LP. Went over instructions again as well. Asked that they call if questions. She states she will.

## 2013-10-28 ENCOUNTER — Other Ambulatory Visit: Payer: Self-pay | Admitting: Family Medicine

## 2013-10-28 DIAGNOSIS — R748 Abnormal levels of other serum enzymes: Secondary | ICD-10-CM

## 2013-11-02 ENCOUNTER — Encounter (HOSPITAL_COMMUNITY): Payer: Self-pay | Admitting: Pharmacy Technician

## 2013-11-02 ENCOUNTER — Other Ambulatory Visit: Payer: Self-pay | Admitting: Radiology

## 2013-11-05 ENCOUNTER — Ambulatory Visit (HOSPITAL_COMMUNITY)
Admission: RE | Admit: 2013-11-05 | Discharge: 2013-11-05 | Disposition: A | Discharge status: Home / Self Care | Payer: 59 | Source: Ambulatory Visit | Attending: Neurology | Admitting: Neurology

## 2013-11-05 DIAGNOSIS — G43909 Migraine, unspecified, not intractable, without status migrainosus: Secondary | ICD-10-CM | POA: Insufficient documentation

## 2013-11-05 DIAGNOSIS — R9389 Abnormal findings on diagnostic imaging of other specified body structures: Secondary | ICD-10-CM

## 2013-11-05 DIAGNOSIS — H538 Other visual disturbances: Secondary | ICD-10-CM

## 2013-11-05 DIAGNOSIS — R200 Anesthesia of skin: Secondary | ICD-10-CM

## 2013-11-05 LAB — CSF CELL COUNT WITH DIFFERENTIAL
RBC Count, CSF: 4 /mm3 — ABNORMAL HIGH
Tube #: 3

## 2013-11-05 LAB — PROTEIN AND GLUCOSE, CSF
Glucose, CSF: 56 mg/dL (ref 43–76)
Total  Protein, CSF: 12 mg/dL — ABNORMAL LOW (ref 15–45)

## 2013-11-05 LAB — CRYPTOCOCCAL ANTIGEN, CSF: Crypto Ag: NEGATIVE

## 2013-11-05 MED ORDER — ACETAMINOPHEN 325 MG PO TABS
ORAL_TABLET | ORAL | Status: AC
  Administered 2013-11-05: 650 mg
  Filled 2013-11-05: qty 2

## 2013-11-05 MED ORDER — ACETAMINOPHEN 325 MG PO TABS: 650.0000 mg | ORAL_TABLET | ORAL | Status: DC | PRN

## 2013-11-05 MED ORDER — OXYCODONE HCL 5 MG PO TABS
5.0000 mg | ORAL_TABLET | Freq: Once | ORAL | Status: AC
  Administered 2013-11-05: 5 mg via ORAL
  Filled 2013-11-05: qty 1

## 2013-11-05 NOTE — Procedures (Signed)
Lumbar puncture was performed at the left L2-3 level using a 21 gauge needle with return of clear CSF with an opening pressure of 10 cm water. 9 ml of CSF were obtained for laboratory studies.  Details dictated in Radiology report.

## 2013-11-10 ENCOUNTER — Telehealth: Payer: Self-pay | Admitting: Neurology

## 2013-11-10 LAB — OLIGOCLONAL BANDS, CSF + SERM

## 2013-11-10 NOTE — Telephone Encounter (Signed)
Spoke with Rosey Bath, patient's mom. Information given as per Dr. Arbutus Leas below. Will wait for Dr. Moises Blood return to discuss results. **Dr. Everlena Cooper, awaiting results of LP done on 11/05/13.

## 2013-11-10 NOTE — Telephone Encounter (Signed)
Received a voicemail message at 8:38 this morning from the patient's mother, Rosey Bath re: results of her daughter's LP done last week on November 20th. **Dr. Arbutus Leas, would you look over the lab results and let me know if there is something that needs to be addressed before Dr. Everlena Cooper returns on 11/16/13? Thank you.

## 2013-11-10 NOTE — Telephone Encounter (Signed)
Not all of the results are back yet.  Most are and they look okay but will wait until Dr. Everlena Cooper comes back and they can fully discuss when all labs are back.

## 2013-11-15 NOTE — Telephone Encounter (Signed)
The only result still outstanding from the CSF is the Lyme (which i suspect will be okay).  Otherwise, all tests are okay.  This is likely a migraine and i would continue treating it as such.

## 2013-11-17 NOTE — Telephone Encounter (Signed)
Spoke with the patient's mom, Rosey Bath. Information given as per Dr. Everlena Cooper below. No additional questions or concerns at this time. She will pass along this information to [employer].

## 2013-11-18 ENCOUNTER — Telehealth: Payer: Self-pay | Admitting: Neurology

## 2013-11-18 NOTE — Telephone Encounter (Signed)
Spoke with the patient's mom. Info given as per Dr. Everlena Cooper below.

## 2013-11-18 NOTE — Telephone Encounter (Signed)
Message copied by Benay Spice on Wed Nov 18, 2013  3:55 PM ------      Message from: JAFFE, ADAM R      Created: Wed Nov 18, 2013  6:57 AM       Lyme is negative.      ----- Message -----         From: Lab In [place] Interface         Sent: 11/05/2013  12:46 PM           To: Cira Servant, DO                   ------

## 2013-11-23 ENCOUNTER — Telehealth: Payer: Self-pay

## 2013-11-23 ENCOUNTER — Other Ambulatory Visit: Payer: Self-pay | Admitting: Family Medicine

## 2013-11-23 MED ORDER — HYDROCODONE-HOMATROPINE 5-1.5 MG/5ML PO SYRP: 5.0000 mL | ORAL_SOLUTION | Freq: Four times a day (QID) | ORAL | Status: DC | PRN

## 2013-11-23 NOTE — Telephone Encounter (Signed)
PT STATES SHE HAVE THE SAME SYMPTOMS FROM THE MONO AND WANTED A WRITTEN PRESCRIPTION FOR THE COUGH MEDICINE PLEASE CALL [phone]

## 2013-11-23 NOTE — Telephone Encounter (Signed)
Does she need to RTC? This was last prescribed on 11/17

## 2013-11-26 NOTE — Telephone Encounter (Signed)
Pt has already p/up Rx. I called and LMOM on cell # as OK'd on [employer] explaining the importance of RTC just for the LFT labs.

## 2013-12-03 ENCOUNTER — Ambulatory Visit (INDEPENDENT_AMBULATORY_CARE_PROVIDER_SITE_OTHER): Payer: 59 | Admitting: Family Medicine

## 2013-12-03 VITALS — BP 102/70 | HR 84 | Temp 97.4°F | Resp 18 | Ht 63.0 in | Wt 228.0 lb

## 2013-12-03 DIAGNOSIS — J209 Acute bronchitis, unspecified: Secondary | ICD-10-CM

## 2013-12-03 DIAGNOSIS — B2790 Infectious mononucleosis, unspecified without complication: Secondary | ICD-10-CM

## 2013-12-03 DIAGNOSIS — R7401 Elevation of levels of liver transaminase levels: Secondary | ICD-10-CM

## 2013-12-03 DIAGNOSIS — H6691 Otitis media, unspecified, right ear: Secondary | ICD-10-CM

## 2013-12-03 DIAGNOSIS — H6690 Otitis media, unspecified, unspecified ear: Secondary | ICD-10-CM

## 2013-12-03 LAB — POCT CBC
HCT, POC: 39.5 % (ref 37.7–47.9)
Hemoglobin: 12.2 g/dL (ref 12.2–16.2)
Lymph, poc: 2.2 (ref 0.6–3.4)
MCH, POC: 26.9 pg — AB (ref 27–31.2)
MCHC: 30.9 g/dL — AB (ref 31.8–35.4)
MPV: 11.4 fL (ref 0–99.8)
POC MID %: 5.8 %M (ref 0–12)
RDW, POC: 14 %
WBC: 8.2 10*3/uL (ref 4.6–10.2)

## 2013-12-03 LAB — COMPREHENSIVE METABOLIC PANEL
Alkaline Phosphatase: 61 U/L (ref 39–117)
CO2: 28 mEq/L (ref 19–32)
Calcium: 9.3 mg/dL (ref 8.4–10.5)
Chloride: 105 mEq/L (ref 96–112)
Creat: 0.72 mg/dL (ref 0.50–1.10)
Glucose, Bld: 103 mg/dL — ABNORMAL HIGH (ref 70–99)
Sodium: 141 mEq/L (ref 135–145)
Total Bilirubin: 0.4 mg/dL (ref 0.3–1.2)
Total Protein: 7.2 g/dL (ref 6.0–8.3)

## 2013-12-03 MED ORDER — CEFDINIR 300 MG PO CAPS: 300.0000 mg | ORAL_CAPSULE | Freq: Two times a day (BID) | ORAL | Status: DC

## 2013-12-03 MED ORDER — IPRATROPIUM BROMIDE 0.03 % NA SOLN: 2.0000 | Freq: Four times a day (QID) | NASAL | Status: DC

## 2013-12-03 MED ORDER — HYDROCOD POLST-CHLORPHEN POLST 10-8 MG/5ML PO LQCR: 5.0000 mL | Freq: Two times a day (BID) | ORAL | Status: DC | PRN

## 2013-12-03 MED ORDER — ANTIPYRINE-BENZOCAINE 5.4-1.4 % OT SOLN: 3.0000 [drp] | OTIC | Status: DC | PRN

## 2013-12-03 MED ORDER — PSEUDOEPHEDRINE HCL ER 120 MG PO TB12: 120.0000 mg | ORAL_TABLET | Freq: Two times a day (BID) | ORAL | Status: DC

## 2013-12-03 NOTE — Progress Notes (Signed)
Subjective:    Patient ID: Krystal Ferguson, female    DOB: 07/14/1991, 22 y.o.   MRN: [id number] This chart was scribed for Norberto Sorenson, MD by Danella Maiers, ED Scribe. This patient was seen in room 8 and the patient's care was started at 10:06 AM.  Chief Complaint  Patient presents with  . Otalgia    right ear drainage, causing teeth pain   . Cough  . labs -liver enzymes    was told to repeat     HPI HPI Comments: Krystal Ferguson is a 22 y.o. female with a h/o Mono diagnosed one month ago who presents to the [facility] complaining of cough and right otalgia for the past week. She states she had a dry cough with the mono, it went away, and came back one week ago as a productive cough with yellow sputum. She reports congestion and rhinorrhea with yellow mucous. She states the otalgia is associated with ear drainage and she has decreased hearing in this ear. She states the otalgia has spread down into her teeth. She reports mild wheezing after coughing spells. She was taking hycodan with relief but is out of it now.  She denies fevers, chills, myalgias, ShOB.   She states she recently had a normal LP that was a routine test from when they found a lesion in her brain.   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  Pt was diagnosed with Mono by Dr Conley Rolls in early November and needs a repeat on labs to check liver enzymes.  PCP Lupita Raider, MD  Past Medical History  Diagnosis Date  . Polycystic ovaries   . Chest pain   . Migraine    Current Outpatient Prescriptions on File Prior to Visit  Medication Sig Dispense Refill  . Diphenhyd-Lidocaine-Nystatin (FIRST-BXN MOUTHWASH) SUSP Swish and spit 5 mLs 3 (three) times daily as needed (mouth pain).       Marland Kitchen ibuprofen (ADVIL,MOTRIN) 200 MG tablet Take 600 mg by mouth every 8 (eight) hours as needed for headache or moderate pain.      . LO LOESTRIN FE 1 MG-10 MCG / 10 MCG tablet Take 1 tablet by mouth every morning.        . Multiple Vitamin (MULTIVITAMIN WITH MINERALS) TABS Take 1 tablet by mouth every morning.       Marland Kitchen HYDROcodone-homatropine (HYCODAN) 5-1.5 MG/5ML syrup Take 5 mLs by mouth every 6 (six) hours as needed for cough. May make you drowsy, do not drive/operate heavy machinary  120 mL  0  . oxyCODONE-acetaminophen (PERCOCET) 5-325 MG per tablet Take 1 tablet by mouth every 4 (four) hours as needed for severe pain.  10 tablet  0  . predniSONE (DELTASONE) 20 MG tablet Take 2 tablets (40 mg total) by mouth daily.  10 tablet  0  . topiramate (TOPAMAX) 50 MG tablet Take 25 mg by mouth at bedtime. Titrate up to 1 tablet at bedtime after 1 week       No current facility-administered medications on file prior to visit.   Allergies  Allergen Reactions  . Citrus Hives and Rash    Review of Systems  Constitutional: Negative for fever, chills and activity change.  HENT: Positive for congestion, ear discharge, ear pain, hearing loss, postnasal drip, rhinorrhea and sinus pressure.   Respiratory: Positive for cough and wheezing. Negative for shortness of breath.   Gastrointestinal: Negative for diarrhea and constipation.  Musculoskeletal: Negative for arthralgias,  myalgias, neck pain and neck stiffness.  Psychiatric/Behavioral: Positive for sleep disturbance.      BP 102/70  Pulse 84  Temp(Src) 97.4 F (36.3 C) (Oral)  Resp 18  Ht 5\' 3"  (1.6 m)  Wt 228 lb (103.42 kg)  BMI 40.40 kg/m2  SpO2 98%  LMP 11/05/2013  Objective:   Physical Exam  Constitutional: She is oriented to person, place, and time. She appears well-developed and well-nourished. No distress.  HENT:  Head: Normocephalic and atraumatic.  Right Ear: External ear and ear canal normal. Tympanic membrane is scarred, erythematous and bulging. Tympanic membrane is not perforated. A middle ear effusion is present.  Left Ear: Tympanic membrane, external ear and ear canal normal.  Nose: Mucosal edema and rhinorrhea (right worse than left)  present.  Mouth/Throat: Uvula is midline and mucous membranes are normal. Posterior oropharyngeal erythema present. No oropharyngeal exudate, posterior oropharyngeal edema or tonsillar abscesses.  1 + tonsils.  Eyes: Conjunctivae are normal. Right eye exhibits no discharge. Left eye exhibits no discharge. No scleral icterus.  Neck: Normal range of motion. Neck supple.  Cardiovascular: Normal rate, regular rhythm, normal heart sounds and intact distal pulses.   Pulmonary/Chest: Effort normal and breath sounds normal.  Abdominal: Soft. Bowel sounds are normal. She exhibits no distension and no mass. There is no tenderness. There is no rebound and no guarding.  Lymphadenopathy:       Head (right side): Submandibular and tonsillar adenopathy present.    She has cervical adenopathy.       Right cervical: Superficial cervical adenopathy present.       Left cervical: Superficial cervical adenopathy present.  Neurological: She is alert and oriented to person, place, and time.  Skin: Skin is warm and dry. She is not diaphoretic. No erythema.  Psychiatric: She has a normal mood and affect. Her behavior is normal.      Results for orders placed in visit on 12/03/13  COMPREHENSIVE METABOLIC PANEL      Result Value Range   Sodium 141  135 - 145 mEq/L   Potassium 4.1  3.5 - 5.3 mEq/L   Chloride 105  96 - 112 mEq/L   CO2 28  19 - 32 mEq/L   Glucose, Bld 103 (*) 70 - 99 mg/dL   BUN 9  6 - 23 mg/dL   Creat 0.86  5.78 - 4.69 mg/dL   Total Bilirubin 0.4  0.3 - 1.2 mg/dL   Alkaline Phosphatase 61  39 - 117 U/L   AST 15  0 - 37 U/L   ALT 12  0 - 35 U/L   Total Protein 7.2  6.0 - 8.3 g/dL   Albumin 4.2  3.5 - 5.2 g/dL   Calcium 9.3  8.4 - 62.9 mg/dL  POCT CBC      Result Value Range   WBC 8.2  4.6 - 10.2 K/uL   Lymph, poc 2.2  0.6 - 3.4   POC LYMPH PERCENT 27.0  10 - 50 %L   MID (cbc) 0.5  0 - 0.9   POC MID % 5.8  0 - 12 %M   POC Granulocyte 5.5  2 - 6.9   Granulocyte percent 67.2  37 - 80 %G    RBC 4.54  4.04 - 5.48 M/uL   Hemoglobin 12.2  12.2 - 16.2 g/dL   HCT, POC 52.8  41.3 - 47.9 %   MCV 87.0  80 - 97 fL   MCH, POC 26.9 (*) 27 - 31.2  pg   MCHC 30.9 (*) 31.8 - 35.4 g/dL   RDW, POC 16.1     Platelet Count, POC 243  142 - 424 K/uL   MPV 11.4  0 - 99.8 fL   Assessment & Plan:   Otitis media, right - Plan: POCT CBC - start omnicef.  Acute bronchitis  Infectious mononucleosis - Plan: Comprehensive metabolic panel - suspect largely resolved. Recheck LFTs.  Elevated transaminase level  Meds ordered this encounter  Medications  . antipyrine-benzocaine (AURALGAN) otic solution    Sig: Place 3-4 drops into the right ear every 2 (two) hours as needed for ear pain.    Dispense:  10 mL    Refill:  0  . cefdinir (OMNICEF) 300 MG capsule    Sig: Take 1 capsule (300 mg total) by mouth 2 (two) times daily.    Dispense:  20 capsule    Refill:  0  . chlorpheniramine-HYDROcodone (TUSSIONEX PENNKINETIC ER) 10-8 MG/5ML LQCR    Sig: Take 5 mLs by mouth every 12 (twelve) hours as needed for cough (cough).    Dispense:  115 mL    Refill:  0  . pseudoephedrine (SUDAFED 12 HOUR) 120 MG 12 hr tablet    Sig: Take 1 tablet (120 mg total) by mouth 2 (two) times daily.    Dispense:  30 tablet    Refill:  1  . ipratropium (ATROVENT) 0.03 % nasal spray    Sig: Place 2 sprays into the nose 4 (four) times daily.    Dispense:  30 mL    Refill:  1    I personally performed the services described in this documentation, which was scribed in my presence. The recorded information has been reviewed and considered, and addended by me as needed.  Norberto Sorenson, MD MPH

## 2013-12-03 NOTE — Patient Instructions (Addendum)
Use a humidifier in your room, frequent nasal saline spray, hot showers, steam treatments, hot teas and liquids. Otitis Media, Adult A middle ear infection is an infection in the space behind the eardrum. The medical name for this is "otitis media." It may happen after a common cold. It is caused by a germ that starts growing in that space. You may feel swollen glands in your neck on the side of the ear infection. HOME CARE INSTRUCTIONS   Take your medicine as directed until it is gone, even if you feel better after the first few days.  Only take over-the-counter or prescription medicines for pain, discomfort, or fever as directed by your caregiver.  Occasional use of a nasal decongestant a couple times per day may help with discomfort and help the eustachian tube to drain better. Follow up with your caregiver in 10 to 14 days or as directed, to be certain that the infection has cleared. Not keeping the appointment could result in a chronic or permanent injury, pain, hearing loss and disability. If there is any problem keeping the appointment, you must call back to this facility for assistance. SEEK IMMEDIATE MEDICAL CARE IF:   You are not getting better in 2 to 3 days.  You have pain that is not controlled with medication.  You feel worse instead of better.  You cannot use the medication as directed.  You develop swelling, redness or pain around the ear or stiffness in your neck. MAKE SURE YOU:   Understand these instructions.  Will watch your condition.  Will get help right away if you are not doing well or get worse. Document Released: 09/07/2004 Document Revised: 02/25/2012 Document Reviewed: 06/30/2013 [facility] Patient Information 2014 [place], [place].

## 2013-12-18 LAB — AFB CULTURE WITH SMEAR (NOT AT ARMC): Acid Fast Smear: NONE SEEN

## 2014-01-10 ENCOUNTER — Encounter: Payer: Self-pay | Admitting: Family Medicine

## 2014-04-06 ENCOUNTER — Emergency Department (HOSPITAL_COMMUNITY): Payer: 59

## 2014-04-06 ENCOUNTER — Observation Stay (HOSPITAL_COMMUNITY)
Admission: EM | Admit: 2014-04-06 | Discharge: 2014-04-08 | Disposition: A | Discharge status: Home / Self Care | Payer: 59 | Attending: Internal Medicine | Admitting: Internal Medicine

## 2014-04-06 ENCOUNTER — Encounter (HOSPITAL_COMMUNITY): Payer: Self-pay | Admitting: Emergency Medicine

## 2014-04-06 ENCOUNTER — Telehealth: Payer: Self-pay | Admitting: Neurology

## 2014-04-06 DIAGNOSIS — R209 Unspecified disturbances of skin sensation: Secondary | ICD-10-CM

## 2014-04-06 DIAGNOSIS — R29810 Facial weakness: Secondary | ICD-10-CM | POA: Insufficient documentation

## 2014-04-06 DIAGNOSIS — E876 Hypokalemia: Secondary | ICD-10-CM | POA: Diagnosis present

## 2014-04-06 DIAGNOSIS — R079 Chest pain, unspecified: Secondary | ICD-10-CM

## 2014-04-06 DIAGNOSIS — F3289 Other specified depressive episodes: Secondary | ICD-10-CM | POA: Insufficient documentation

## 2014-04-06 DIAGNOSIS — E282 Polycystic ovarian syndrome: Secondary | ICD-10-CM | POA: Diagnosis present

## 2014-04-06 DIAGNOSIS — I6529 Occlusion and stenosis of unspecified carotid artery: Secondary | ICD-10-CM | POA: Insufficient documentation

## 2014-04-06 DIAGNOSIS — Z609 Problem related to social environment, unspecified: Secondary | ICD-10-CM | POA: Insufficient documentation

## 2014-04-06 DIAGNOSIS — H539 Unspecified visual disturbance: Secondary | ICD-10-CM

## 2014-04-06 DIAGNOSIS — R200 Anesthesia of skin: Secondary | ICD-10-CM

## 2014-04-06 DIAGNOSIS — R531 Weakness: Secondary | ICD-10-CM | POA: Diagnosis present

## 2014-04-06 DIAGNOSIS — I658 Occlusion and stenosis of other precerebral arteries: Secondary | ICD-10-CM | POA: Insufficient documentation

## 2014-04-06 DIAGNOSIS — F411 Generalized anxiety disorder: Secondary | ICD-10-CM | POA: Insufficient documentation

## 2014-04-06 DIAGNOSIS — R29898 Other symptoms and signs involving the musculoskeletal system: Principal | ICD-10-CM | POA: Insufficient documentation

## 2014-04-06 DIAGNOSIS — F329 Major depressive disorder, single episode, unspecified: Secondary | ICD-10-CM | POA: Insufficient documentation

## 2014-04-06 DIAGNOSIS — G43909 Migraine, unspecified, not intractable, without status migrainosus: Secondary | ICD-10-CM | POA: Diagnosis present

## 2014-04-06 LAB — CBC WITH DIFFERENTIAL/PLATELET
BASOS ABS: 0 10*3/uL (ref 0.0–0.1)
Basophils Relative: 0 % (ref 0–1)
EOS PCT: 1 % (ref 0–5)
Eosinophils Absolute: 0.1 10*3/uL (ref 0.0–0.7)
HEMATOCRIT: 41.4 % (ref 36.0–46.0)
Hemoglobin: 13 g/dL (ref 12.0–15.0)
Lymphocytes Relative: 26 % (ref 12–46)
Lymphs Abs: 2.3 10*3/uL (ref 0.7–4.0)
MCH: 26.9 pg (ref 26.0–34.0)
MCHC: 31.4 g/dL (ref 30.0–36.0)
MCV: 85.7 fL (ref 78.0–100.0)
MONO ABS: 0.7 10*3/uL (ref 0.1–1.0)
Monocytes Relative: 8 % (ref 3–12)
Neutro Abs: 5.9 10*3/uL (ref 1.7–7.7)
Neutrophils Relative %: 65 % (ref 43–77)
PLATELETS: 244 10*3/uL (ref 150–400)
RBC: 4.83 MIL/uL (ref 3.87–5.11)
RDW: 13.8 % (ref 11.5–15.5)
WBC: 9.1 10*3/uL (ref 4.0–10.5)

## 2014-04-06 LAB — URINALYSIS, ROUTINE W REFLEX MICROSCOPIC
GLUCOSE, UA: NEGATIVE mg/dL
Ketones, ur: 15 mg/dL — AB
Leukocytes, UA: NEGATIVE
Nitrite: NEGATIVE
Protein, ur: 30 mg/dL — AB
SPECIFIC GRAVITY, URINE: 1.037 — AB (ref 1.005–1.030)
Urobilinogen, UA: 0.2 mg/dL (ref 0.0–1.0)
pH: 5.5 (ref 5.0–8.0)

## 2014-04-06 LAB — COMPREHENSIVE METABOLIC PANEL
ALT: 10 U/L (ref 0–35)
AST: 15 U/L (ref 0–37)
Albumin: 4.1 g/dL (ref 3.5–5.2)
Alkaline Phosphatase: 56 U/L (ref 39–117)
BUN: 12 mg/dL (ref 6–23)
CALCIUM: 9.7 mg/dL (ref 8.4–10.5)
CO2: 25 mEq/L (ref 19–32)
CREATININE: 0.73 mg/dL (ref 0.50–1.10)
Chloride: 105 mEq/L (ref 96–112)
GFR calc Af Amer: >90 mL/min (ref >90)
GFR calc non Af Amer: >90 mL/min (ref >90)
Glucose, Bld: 87 mg/dL (ref 70–99)
Potassium: 3.4 mEq/L — ABNORMAL LOW (ref 3.7–5.3)
Sodium: 141 mEq/L (ref 137–147)
Total Bilirubin: 0.3 mg/dL (ref 0.3–1.2)
Total Protein: 7.8 g/dL (ref 6.0–8.3)

## 2014-04-06 LAB — URINE MICROSCOPIC-ADD ON

## 2014-04-06 LAB — PREGNANCY, URINE: PREG TEST UR: NEGATIVE

## 2014-04-06 MED ORDER — ENOXAPARIN SODIUM 40 MG/0.4ML ~~LOC~~ SOLN
40.0000 mg | SUBCUTANEOUS | Status: DC
  Administered 2014-04-07 – 2014-04-08 (×2): 40 mg via SUBCUTANEOUS
  Filled 2014-04-06 (×2): qty 0.4

## 2014-04-06 MED ORDER — ONDANSETRON HCL 4 MG/2ML IJ SOLN: 4.0000 mg | Freq: Three times a day (TID) | INTRAMUSCULAR | Status: AC | PRN

## 2014-04-06 MED ORDER — ACETAMINOPHEN 325 MG PO TABS: 650.0000 mg | ORAL_TABLET | ORAL | Status: DC | PRN

## 2014-04-06 MED ORDER — ASPIRIN 325 MG PO TABS
325.0000 mg | ORAL_TABLET | Freq: Every day | ORAL | Status: DC
  Administered 2014-04-07 – 2014-04-08 (×2): 325 mg via ORAL
  Filled 2014-04-06 (×2): qty 1

## 2014-04-06 MED ORDER — NORETHIN-ETH ESTRAD-FE BIPHAS 1 MG-10 MCG / 10 MCG PO TABS: 1.0000 | ORAL_TABLET | Freq: Every day | ORAL | Status: DC

## 2014-04-06 MED ORDER — HYDROMORPHONE HCL PF 1 MG/ML IJ SOLN: 0.5000 mg | INTRAMUSCULAR | Status: DC | PRN

## 2014-04-06 MED ORDER — ADULT MULTIVITAMIN W/MINERALS CH
1.0000 | ORAL_TABLET | Freq: Every morning | ORAL | Status: DC
  Administered 2014-04-07 – 2014-04-08 (×2): 1 via ORAL
  Filled 2014-04-06 (×2): qty 1

## 2014-04-06 MED ORDER — SODIUM CHLORIDE 0.9 % IV SOLN
INTRAVENOUS | Status: DC
  Administered 2014-04-07 (×2): via INTRAVENOUS

## 2014-04-06 NOTE — Telephone Encounter (Signed)
[phone] or [phone] please call pt need to talk to someone

## 2014-04-06 NOTE — H&P (Signed)
[facility] History and Physical  Krystal Ferguson WVP:[id number] DOB: 1991/10/11 DOA: 04/06/2014  Referring physician:  EDP PCP: Mayra Neer, MD  Specialists:   Chief Complaint: Left Face and Arm Weakness  HPI: Krystal Ferguson is a 23 y.o. female who presents to the ED with complaints of left face and left arm numbness and weakness noticed at 8 am.  She also reports having  Visual changes in the left eye that are intermittent that is like seeing through a grey veil.  She has a history of migraine HAs, but denies having a headache associated with her symptoms at this time.  She was hospitalized in 08/2013 for similar symptoms but were associated with a migraine at that time and she was diagnosed with a complex migraine.   A CT Scan and MRI of the Brain were performed and were negative for acute findings, and unchanged from 08/2013.    She was seen by Neurology Dr. Nicole Kindred.      Review of Systems:  Constitutional: No Weight Loss, No Weight Gain, Night Sweats, Fevers, Chills, Fatigue, or Generalized Weakness HEENT: No Headaches, Difficulty Swallowing,Tooth/Dental Problems,Sore Throat,  No Sneezing, Rhinitis, Ear Ache, Nasal Congestion, or Post Nasal Drip,  Cardio-vascular:  No Chest pain, Orthopnea, PND, Edema in lower extremities, Anasarca, Dizziness, Palpitations  Resp: No Dyspnea, No DOE, No Cough, No Hemoptysis, No Wheezing.    GI: No Heartburn, Indigestion, Abdominal Pain, Nausea, Vomiting, Diarrhea, Change in Bowel Habits,  Loss of Appetite  GU: No Dysuria, Change in Color of Urine, No Urgency or Frequency.  No flank pain.  Musculoskeletal: No Joint Pain or Swelling.  No Decreased Range of Motion. No Back Pain.  Neurologic: No Syncope, No Seizures, +Left Facial and Left ARM Weakness and Paresthesia, +Vision Disturbance, No Diplopia, No Vertigo, No Difficulty Walking,  Skin: No Rash or Lesions. Psych: No Change in Mood or Affect. No Depression or Anxiety. No Memory loss. No  Confusion or Hallucinations   Past Medical History  Diagnosis Date  . Polycystic ovaries   . Chest pain   . Migraine       History reviewed. No pertinent past surgical history.     Prior to Admission medications   Medication Sig Start Date End Date Taking? Authorizing Provider  ibuprofen (ADVIL,MOTRIN) 200 MG tablet Take 400 mg by mouth every 8 (eight) hours as needed for mild pain.   Yes Historical Provider, MD  LO LOESTRIN FE 1 MG-10 MCG / 10 MCG tablet Take 1 tablet by mouth every morning.  08/21/13  Yes Historical Provider, MD  Multiple Vitamin (MULTIVITAMIN WITH MINERALS) TABS Take 1 tablet by mouth every morning.    Yes Historical Provider, MD      Allergies  Allergen Reactions  . Citrus Hives and Rash     Social History:  reports that she has never smoked. She has never used smokeless tobacco. She reports that she drinks alcohol. She reports that she does not use illicit drugs.     Family History  Problem Relation Age of Onset  . Bladder Cancer Mother   . Kidney disease Father   . Heart disease Maternal Grandmother   . Heart disease Paternal Grandmother        Physical Exam:  GEN:  Pleasant Obese 23 y.o. Caucasian female  examined  and in no acute distress; cooperative with exam Filed Vitals:   04/06/14 1909 04/06/14 2238  BP: 132/76   Pulse: 68   Temp: 98 F (36.7 C) 98.2 F (36.8  C)  TempSrc: Oral   Resp: 18   Weight: 102.683 kg (226 lb 6 oz)   SpO2: 99%    Blood pressure 132/76, pulse 68, temperature 98.2 F (36.8 C), temperature source Oral, resp. rate 18, weight 102.683 kg (226 lb 6 oz), last menstrual period 04/03/2014, SpO2 99.00%. PSYCH: She is alert and oriented x4; does not appear anxious does not appear depressed; affect is normal HEENT: Normocephalic and Atraumatic, Mucous membranes pink; PERRLA; EOM intact; Fundi:  Benign;  No scleral icterus, Nares: Patent, Oropharynx: Clear, Fair Dentition, Neck:  FROM, no cervical lymphadenopathy nor  thyromegaly or carotid bruit; no JVD; Breasts:: Not examined CHEST WALL: No tenderness CHEST: Normal respiration, clear to auscultation bilaterally HEART: Regular rate and rhythm; no murmurs rubs or gallops BACK: No kyphosis or scoliosis; no CVA tenderness ABDOMEN: Positive Bowel Sounds, Obese, soft non-tender; no masses, no organomegaly. Rectal Exam: Not done EXTREMITIES: No cyanosis, clubbing or edema; no ulcerations. Genitalia: not examined PULSES: 2+ and symmetric SKIN: Normal hydration no rash or ulceration CNS:   Mental Status:  Alert, oriented, thought content appropriate. Speech fluent without evidence of aphasia. Able to follow 3 step commands without difficulty. In No obvious pain.  Cranial Nerves:  II: Discs flat bilaterally; Visual fields Intact,  Pupils equal and reactive.  III,IV, VI: extra-ocular motions intact bilaterally  V,VII: smile symmetric, facial light touch sensation normal bilaterally  VIII: hearing decreasesd bilaterally  IX,X: gag reflex present  XI: bilateral shoulder shrug  XII: midline tongue extension  Motor:  Right : Upper extremity 5/5   Left: Upper extremity 3/5  Right : Lower extremity 5/5   Left : Lower extremity 5/5  Tone and bulk:normal tone throughout; no atrophy noted  Sensory: Pinprick and light touch intact throughout, bilaterally  Deep Tendon Reflexes: 2+ and symmetric throughout  Plantars/ Babinski: Right: normal Left: normal   Cerebellar:  Finger to nose with difficulty from Left hand.  Gait: deferred  Vascular: pulses palpable throughout    Labs on Admission:  Basic Metabolic Panel:  Recent Labs Lab 04/06/14 1914  NA 141  K 3.4*  CL 105  CO2 25  GLUCOSE 87  BUN 12  CREATININE 0.73  CALCIUM 9.7   Liver Function Tests:  Recent Labs Lab 04/06/14 1914  AST 15  ALT 10  ALKPHOS 56  BILITOT 0.3  PROT 7.8  ALBUMIN 4.1   No results found for this basename: LIPASE, AMYLASE,  in the last 168 hours No results found for  this basename: AMMONIA,  in the last 168 hours CBC:  Recent Labs Lab 04/06/14 1914  WBC 9.1  NEUTROABS 5.9  HGB 13.0  HCT 41.4  MCV 85.7  PLT 244   Cardiac Enzymes: No results found for this basename: CKTOTAL, CKMB, CKMBINDEX, TROPONINI,  in the last 168 hours  BNP (last 3 results) No results found for this basename: PROBNP,  in the last 8760 hours CBG: No results found for this basename: GLUCAP,  in the last 168 hours  Radiological Exams on Admission: Ct Head (brain) Wo Contrast  04/06/2014   CLINICAL DATA:  Left face/arm numbness  EXAM: CT HEAD WITHOUT CONTRAST  TECHNIQUE: Contiguous axial images were obtained from the base of the skull through the vertex without intravenous contrast.  COMPARISON:  CT head and MRI brain dated 09/05/2013. MRA brain dated 09/29/2013.  FINDINGS: No evidence of parenchymal hemorrhage or extra-axial fluid collection. No mass lesion, mass effect, or midline shift.  No CT evidence of acute infarction.  Vague subcortical hypodensity in the posterior left frontal lobe (series 2/image 21), chronic.  The visualized paranasal sinuses are essentially clear. The mastoid air cells are unopacified.  No evidence of calvarial fracture.  IMPRESSION: No evidence of acute intracranial abnormality.  Vague subcortical hypodensity in posterior left frontal lobe, chronic.   Electronically Signed   By: Julian Hy M.D.   On: 04/06/2014 19:58   Mr Brain Wo Contrast  04/06/2014   CLINICAL DATA:  Left facial numbness starting this morning, mild left arm drift.  EXAM: MRI HEAD WITHOUT CONTRAST  TECHNIQUE: Multiplanar, multiecho pulse sequences of the brain and surrounding structures were obtained without intravenous contrast.  COMPARISON:  CT HEAD W/O CM dated 04/06/2014; MR HEAD WO/W CM dated 09/05/2013  FINDINGS: No reduced diffusion to suggest acute ischemia nor hyperacute demyelination. There is susceptibility artifact to suggest hemorrhage.  Less than 5 supratentorial white  matter subcentimeter T2 hyperintensities are were present previously, in addition to an ovoid 10 mm T2 hyperintensity which loses signal on FLAIR sequence within the posterior left frontal lobe, unchanged. No new white matter lesions. No mass effect or midline shift. The ventricles and sulci are normal for patient's age.  No abnormal extra-axial fluid collections. Normal major intracranial vascular flow voids seen at the skull base.  Visualized paranasal sinuses and mastoid air cells are well aerated. Ocular globes and orbital contents are unremarkable. No abnormal sellar expansion. No cerebellar tonsillar ectopia. No suspicious calvarial bone marrow signal.  IMPRESSION: No acute intracranial process. If clinical concern for subtle infectious/inflammatory process, contrast-enhanced sequences would be more sensitive.  Stable appearance of the head: Less than 5 subcentimeter supratentorial white matter lesions are nonspecific, and could reflect sequelae of migraine disorder, vasculopathy, less likely chronic ischemia; this is an atypical distribution for classic demyelination. No parenchymal brain volume loss for age.   Electronically Signed   By: Elon Alas   On: 04/06/2014 22:09       Assessment/Plan:   23 y.o. female with  Principal Problem:   Left-sided weakness Active Problems:   Polycystic ovaries   Migraine headache   Hypokalemia     1.  Left Sided Weakness-   TIA Workup initiated, Neuro checks, Carotid US and 2 D ECHO in AM, check Fasting Lipids, and HbA1c.  Neuro Following.      2.  Migraine Headaches- history, not having HA at this time.    3.  Polycystic Ovarian Disease-   On OCPs.    4.  Hypokalemia-   Replete K+.     5.  DVT prophylaxis with Lovenox.          Code Status:  FULL CODE Family Communication:   Mother and Sister at bedside Disposition Plan:     Observation Telemetry  Time spent:  7 Minutes  [facility] Pager  [phone]  If 7PM-7AM, please contact night-coverage [email or web address] Password [facility] 04/06/2014, 11:32 PM

## 2014-04-06 NOTE — Telephone Encounter (Signed)
Patient called stating she is now having numbness in face arm and hands  .I spoke with Dr Delice Lesch and patient was advised to go to ER and to make a follow up appt  Next week

## 2014-04-06 NOTE — ED Provider Notes (Signed)
Medical screening examination/treatment/procedure(s) were conducted as a shared visit with non-physician practitioner(s) and myself.  I personally evaluated the patient during the encounter.  Pt presents with left sided numbness and weakness involving face and arm.  MRI without acute CVA.  Neurology consulted.  Admit to medical service for monitoring further evaluation.  Kathalene Frames, MD 04/06/14 [phone]

## 2014-04-06 NOTE — ED Provider Notes (Signed)
CSN: [id number]     Arrival date & time 04/06/14  1903 History   First MD Initiated Contact with Patient 04/06/14 1919     Chief Complaint  Patient presents with  . Numbness     (Consider location/radiation/quality/duration/timing/severity/associated sxs/prior Treatment) HPI Comments: Patient with history of migraine, PCOS, diabetes -- presents with complaint of left facial numbness, mild facial droop, left arm weakness, left arm numbness, vision change in left eye. Patient has history of workup for MS. Patient had a headache with associated left facial weakness and numbness in 08/2014. She had an MRI, MRA, LP. No definitive diagnosis. Today after waking up she had acute onset of left arm weakness, left facial numbness, left arm numbness, vision as though 'gray sheet' is over left eye. Symptoms have not improved over the day. She does not have a headache today. No N/V/D. She called neurologist office and was asked to go to ED for evaluation.   The history is provided by the patient and medical records.    Past Medical History  Diagnosis Date  . Polycystic ovaries   . Chest pain   . Migraine    History reviewed. No pertinent past surgical history. Family History  Problem Relation Age of Onset  . Bladder Cancer Mother   . Kidney disease Father   . Heart disease Maternal Grandmother   . Heart disease Paternal Grandmother    History  Substance Use Topics  . Smoking status: Never Smoker   . Smokeless tobacco: Never Used  . Alcohol Use: Yes     Comment: occassional   OB History   Grav Para Term Preterm Abortions TAB SAB Ect Mult Living                 Review of Systems  Constitutional: Negative for fever.  HENT: Negative for congestion, dental problem, rhinorrhea and sinus pressure.   Eyes: Positive for visual disturbance. Negative for photophobia, discharge and redness.  Respiratory: Negative for shortness of breath.   Cardiovascular: Negative for chest pain.   Gastrointestinal: Negative for nausea and vomiting.  Musculoskeletal: Negative for gait problem, neck pain and neck stiffness.  Skin: Negative for rash.  Neurological: Positive for facial asymmetry and numbness. Negative for syncope, speech difficulty, weakness, light-headedness and headaches.  Psychiatric/Behavioral: Negative for confusion.      Allergies  Citrus  Home Medications   Prior to Admission medications   Medication Sig Start Date End Date Taking? Authorizing Provider  antipyrine-benzocaine Toniann Fail) otic solution Place 3-4 drops into the right ear every 2 (two) hours as needed for ear pain. 12/03/13   Shawnee Knapp, MD  cefdinir (OMNICEF) 300 MG capsule Take 1 capsule (300 mg total) by mouth 2 (two) times daily. 12/03/13   Shawnee Knapp, MD  chlorpheniramine-HYDROcodone (TUSSIONEX PENNKINETIC ER) 10-8 MG/5ML LQCR Take 5 mLs by mouth every 12 (twelve) hours as needed for cough (cough). 12/03/13   Shawnee Knapp, MD  ibuprofen (ADVIL,MOTRIN) 200 MG tablet Take 600 mg by mouth every 8 (eight) hours as needed for headache or moderate pain.    Historical Provider, MD  ipratropium (ATROVENT) 0.03 % nasal spray Place 2 sprays into the nose 4 (four) times daily. 12/03/13   Shawnee Knapp, MD  LO LOESTRIN FE 1 MG-10 MCG / 10 MCG tablet Take 1 tablet by mouth every morning.  08/21/13   Historical Provider, MD  Multiple Vitamin (MULTIVITAMIN WITH MINERALS) TABS Take 1 tablet by mouth every morning.     Historical  Provider, MD  pseudoephedrine (SUDAFED 12 HOUR) 120 MG 12 hr tablet Take 1 tablet (120 mg total) by mouth 2 (two) times daily. 12/03/13   Shawnee Knapp, MD  topiramate (TOPAMAX) 50 MG tablet Take 25 mg by mouth at bedtime. Titrate up to 1 tablet at bedtime after 1 week    Historical Provider, MD   BP 132/76  Pulse 68  Temp(Src) 98 F (36.7 C) (Oral)  Resp 18  Wt 226 lb 6 oz (102.683 kg)  SpO2 99%  LMP 04/03/2014  Physical Exam  Nursing note and vitals reviewed. Constitutional: She is  oriented to person, place, and time. She appears well-developed and well-nourished.  HENT:  Head: Normocephalic and atraumatic.  Right Ear: Tympanic membrane, external ear and ear canal normal.  Left Ear: Tympanic membrane, external ear and ear canal normal.  Nose: Nose normal.  Mouth/Throat: Uvula is midline, oropharynx is clear and moist and mucous membranes are normal.  Eyes: Conjunctivae, EOM and lids are normal. Pupils are equal, round, and reactive to light. Right eye exhibits no nystagmus. Left eye exhibits no nystagmus.  Neck: Normal range of motion. Neck supple.  Cardiovascular: Normal rate and regular rhythm.   Pulmonary/Chest: Effort normal and breath sounds normal.  Abdominal: Soft. There is no tenderness.  Musculoskeletal:       Cervical back: She exhibits normal range of motion, no tenderness and no bony tenderness.  Neurological: She is alert and oriented to person, place, and time. A sensory deficit is present. No cranial nerve deficit. She exhibits normal muscle tone. Gait normal. GCS eye subscore is 4. GCS verbal subscore is 5. GCS motor subscore is 6.  4-/5 strength L hand, wrist, elbow in flexion/extension. No sensation of cotton wisp L hand, L forearm, lateral L upper arm/shoulder. Decreased sensation remainder of upper arm. No proprioceptive sense L hand.   Skin: Skin is warm and dry.  Psychiatric: She has a normal mood and affect.    ED Course  Procedures (including critical care time) Labs Review Labs Reviewed  COMPREHENSIVE METABOLIC PANEL - Abnormal; Notable for the following:    Potassium 3.4 (*)    All other components within normal limits  URINALYSIS, ROUTINE W REFLEX MICROSCOPIC - Abnormal; Notable for the following:    APPearance CLOUDY (*)    Specific Gravity, Urine 1.037 (*)    Hgb urine dipstick LARGE (*)    Bilirubin Urine SMALL (*)    Ketones, ur 15 (*)    Protein, ur 30 (*)    All other components within normal limits  CBC WITH DIFFERENTIAL   PREGNANCY, URINE  URINE MICROSCOPIC-ADD ON   Imaging Review Ct Head (brain) Wo Contrast  04/06/2014   CLINICAL DATA:  Left face/arm numbness  EXAM: CT HEAD WITHOUT CONTRAST  TECHNIQUE: Contiguous axial images were obtained from the base of the skull through the vertex without intravenous contrast.  COMPARISON:  CT head and MRI brain dated 09/05/2013. MRA brain dated 09/29/2013.  FINDINGS: No evidence of parenchymal hemorrhage or extra-axial fluid collection. No mass lesion, mass effect, or midline shift.  No CT evidence of acute infarction.  Vague subcortical hypodensity in the posterior left frontal lobe (series 2/image 21), chronic.  The visualized paranasal sinuses are essentially clear. The mastoid air cells are unopacified.  No evidence of calvarial fracture.  IMPRESSION: No evidence of acute intracranial abnormality.  Vague subcortical hypodensity in posterior left frontal lobe, chronic.   Electronically Signed   By: Henderson Newcomer.D.  On: 04/06/2014 19:58   Mr Brain Wo Contrast  04/06/2014   CLINICAL DATA:  Left facial numbness starting this morning, mild left arm drift.  EXAM: MRI HEAD WITHOUT CONTRAST  TECHNIQUE: Multiplanar, multiecho pulse sequences of the brain and surrounding structures were obtained without intravenous contrast.  COMPARISON:  CT HEAD W/O CM dated 04/06/2014; MR HEAD WO/W CM dated 09/05/2013  FINDINGS: No reduced diffusion to suggest acute ischemia nor hyperacute demyelination. There is susceptibility artifact to suggest hemorrhage.  Less than 5 supratentorial white matter subcentimeter T2 hyperintensities are were present previously, in addition to an ovoid 10 mm T2 hyperintensity which loses signal on FLAIR sequence within the posterior left frontal lobe, unchanged. No new white matter lesions. No mass effect or midline shift. The ventricles and sulci are normal for patient's age.  No abnormal extra-axial fluid collections. Normal major intracranial vascular flow voids  seen at the skull base.  Visualized paranasal sinuses and mastoid air cells are well aerated. Ocular globes and orbital contents are unremarkable. No abnormal sellar expansion. No cerebellar tonsillar ectopia. No suspicious calvarial bone marrow signal.  IMPRESSION: No acute intracranial process. If clinical concern for subtle infectious/inflammatory process, contrast-enhanced sequences would be more sensitive.  Stable appearance of the head: Less than 5 subcentimeter supratentorial white matter lesions are nonspecific, and could reflect sequelae of migraine disorder, vasculopathy, less likely chronic ischemia; this is an atypical distribution for classic demyelination. No parenchymal brain volume loss for age.   Electronically Signed   By: Elon Alas   On: 04/06/2014 22:09     EKG Interpretation None      8:09 PM Patient seen and examined. Work-up initiated. D/w Dr. Tomi Bamberger. Spoke with Dr. Nicole Kindred who will consult. He requests MR brain wo CM, probable admission.   Vital signs reviewed and are as follows: Filed Vitals:   04/06/14 1909  BP: 132/76  Pulse: 68  Temp: 98 F (36.7 C)  Resp: 18   10:57 PM No improvement in exam. Patient and family informed of MRI results. Will admit for neuro checks.   MDM   Final diagnoses:  Upper extremity weakness  Left facial numbness  Visual disturbance   Admit for above, unclear etiology of symptoms.     Carlisle Cater, PA-C 04/06/14 2258

## 2014-04-06 NOTE — ED Notes (Addendum)
Pt. reports left facial numbness onset 8 am this morning , speech clear , subtle left facial droop/asymmetry , mild left arm drift / weaker grip at left side . Alert and oriented . Denies pain / respirations unlabored . Ambulatory.

## 2014-04-06 NOTE — ED Notes (Signed)
Pt reports left face/left FA numbness that began approx 0800 this a.m. - pt also admits to intermittent left eye "blindness" which pt describes as "a grey sheet over her eye" pt denies any HA or visual changes at present. Pt in no acute distress, A&Ox4. Pt w/ hx of migraines and similar symptoms in the past and has been followed by a neurologist for this.

## 2014-04-06 NOTE — Consult Note (Signed)
Reason for Consult: Numbness involving left face and upper extremity, left visual changes and mild left upper extremity weakness.  HPI:                                                                                                                                          Krystal Ferguson is an 23 y.o. female with a history of migraine headaches and polycystic ovaries presenting with the above symptoms which was first noticed when she woke up this morning. She was last known well at 10 PM on 04/05/2014. She has not experienced a headache with current presenting symptoms. She has not been on antiplatelet therapy. CT scan of her head showed vague subcortical hypodensity in the posterior left frontal lobe which is chronic. No acute changes were noted. Patient has had a recent evaluation to rule out possible demyelinating disease. Workup was unremarkable. MRI study of the brain tonight showed no acute intracranial abnormality. Nonspecific white matter changes are seen which were not thought to be typical for demyelinating disorder.  Past Medical History  Diagnosis Date  . Polycystic ovaries   . Chest pain   . Migraine     History reviewed. No pertinent past surgical history.  Family History  Problem Relation Age of Onset  . Bladder Cancer Mother   . Kidney disease Father   . Heart disease Maternal Grandmother   . Heart disease Paternal Grandmother     Social History:  reports that she has never smoked. She has never used smokeless tobacco. She reports that she drinks alcohol. She reports that she does not use illicit drugs.  Allergies  Allergen Reactions  . Citrus Hives and Rash    MEDICATIONS:                                                                                                                     I have reviewed the patient's current medications.   ROS:  History obtained from the patient  General ROS: negative for - chills, fatigue, fever, night sweats, weight gain or weight loss Psychological ROS: negative for - behavioral disorder, hallucinations, memory difficulties, mood swings or suicidal ideation Ophthalmic ROS: negative for - blurry vision, double vision, eye pain or loss of vision ENT ROS: negative for - epistaxis, nasal discharge, oral lesions, sore throat, tinnitus or vertigo Allergy and Immunology ROS: negative for - hives or itchy/watery eyes Hematological and Lymphatic ROS: negative for - bleeding problems, bruising or swollen lymph nodes Endocrine ROS: negative for - galactorrhea, hair pattern changes, polydipsia/polyuria or temperature intolerance Respiratory ROS: negative for - cough, hemoptysis, shortness of breath or wheezing Cardiovascular ROS: negative for - chest pain, dyspnea on exertion, edema or irregular heartbeat Gastrointestinal ROS: negative for - abdominal pain, diarrhea, hematemesis, nausea/vomiting or stool incontinence Genito-Urinary ROS: negative for - dysuria, hematuria, incontinence or urinary frequency/urgency Musculoskeletal ROS: negative for - joint swelling or muscular weakness Neurological ROS: as noted in HPI Dermatological ROS: negative for rash and skin lesion changes   Blood pressure 132/76, pulse 68, temperature 98 F (36.7 C), temperature source Oral, resp. rate 18, weight 102.683 kg (226 lb 6 oz), last menstrual period 04/03/2014, SpO2 99.00%.   Neurologic Examination:                                                                                                      Mental Status: Alert, oriented, thought content appropriate.  Speech fluent without evidence of aphasia. Able to follow commands without difficulty. Cranial Nerves: II-Visual fields were normal. III/IV/VI-Pupils were equal and reacted. Extraocular movements were full and conjugate.    V/VII-reduced perception of  tactile sensation over left side of the face compared to the right; equivocal mild left lower facial weakness. VIII-normal. X-normal speech and symmetrical palatal movement. Motor: Somewhat nonphysiologic mild drift of left upper extremity noted; motor exam is otherwise unremarkable except for reduced grip strength on the left compared to the right. Sensory: Reduced perception of tactile sensation extremity compared to right upper extremity. Deep Tendon Reflexes: 2+ and symmetric. Plantars: Flexor bilaterally Cerebellar: Normal finger-to-nose testing. Carotid auscultation: Normal  No results found for this basename: cbc, bmp, coags, chol, tri, ldl, hga1c    Results for orders placed during the hospital encounter of 04/06/14 (from the past 48 hour(s))  CBC WITH DIFFERENTIAL     Status: None   Collection Time    04/06/14  7:14 PM      Result Value Ref Range   WBC 9.1  4.0 - 10.5 K/uL   RBC 4.83  3.87 - 5.11 MIL/uL   Hemoglobin 13.0  12.0 - 15.0 g/dL   HCT 41.4  36.0 - 46.0 %   MCV 85.7  78.0 - 100.0 fL   MCH 26.9  26.0 - 34.0 pg   MCHC 31.4  30.0 - 36.0 g/dL   RDW 13.8  11.5 - 15.5 %   Platelets 244  150 - 400 K/uL   Neutrophils Relative % 65  43 - 77 %   Neutro Abs 5.9  1.7 -  7.7 K/uL   Lymphocytes Relative 26  12 - 46 %   Lymphs Abs 2.3  0.7 - 4.0 K/uL   Monocytes Relative 8  3 - 12 %   Monocytes Absolute 0.7  0.1 - 1.0 K/uL   Eosinophils Relative 1  0 - 5 %   Eosinophils Absolute 0.1  0.0 - 0.7 K/uL   Basophils Relative 0  0 - 1 %   Basophils Absolute 0.0  0.0 - 0.1 K/uL  COMPREHENSIVE METABOLIC PANEL     Status: Abnormal   Collection Time    04/06/14  7:14 PM      Result Value Ref Range   Sodium 141  137 - 147 mEq/L   Potassium 3.4 (*) 3.7 - 5.3 mEq/L   Chloride 105  96 - 112 mEq/L   CO2 25  19 - 32 mEq/L   Glucose, Bld 87  70 - 99 mg/dL   BUN 12  6 - 23 mg/dL   Creatinine, Ser 0.73  0.50 - 1.10 mg/dL   Calcium 9.7  8.4 - 10.5 mg/dL   Total Protein 7.8  6.0 - 8.3  g/dL   Albumin 4.1  3.5 - 5.2 g/dL   AST 15  0 - 37 U/L   ALT 10  0 - 35 U/L   Alkaline Phosphatase 56  39 - 117 U/L   Total Bilirubin 0.3  0.3 - 1.2 mg/dL   GFR calc non Af Amer >90  >90 mL/min   GFR calc Af Amer >90  >90 mL/min   Comment: (NOTE)     The eGFR has been calculated using the CKD EPI equation.     This calculation has not been validated in all clinical situations.     eGFR's persistently <90 mL/min signify possible Chronic Kidney     Disease.  URINALYSIS, ROUTINE W REFLEX MICROSCOPIC     Status: Abnormal   Collection Time    04/06/14  7:46 PM      Result Value Ref Range   Color, Urine YELLOW  YELLOW   APPearance CLOUDY (*) CLEAR   Specific Gravity, Urine 1.037 (*) 1.005 - 1.030   pH 5.5  5.0 - 8.0   Glucose, UA NEGATIVE  NEGATIVE mg/dL   Hgb urine dipstick LARGE (*) NEGATIVE   Bilirubin Urine SMALL (*) NEGATIVE   Ketones, ur 15 (*) NEGATIVE mg/dL   Protein, ur 30 (*) NEGATIVE mg/dL   Urobilinogen, UA 0.2  0.0 - 1.0 mg/dL   Nitrite NEGATIVE  NEGATIVE   Leukocytes, UA NEGATIVE  NEGATIVE  PREGNANCY, URINE     Status: None   Collection Time    04/06/14  7:46 PM      Result Value Ref Range   Preg Test, Ur NEGATIVE  NEGATIVE   Comment:            THE SENSITIVITY OF THIS     METHODOLOGY IS >20 mIU/mL.  URINE MICROSCOPIC-ADD ON     Status: None   Collection Time    04/06/14  7:46 PM      Result Value Ref Range   Squamous Epithelial / LPF RARE  RARE   WBC, UA 0-2  <3 WBC/hpf   RBC / HPF 3-6  <3 RBC/hpf   Bacteria, UA RARE  RARE    Ct Head (brain) Wo Contrast  04/06/2014   CLINICAL DATA:  Left face/arm numbness  EXAM: CT HEAD WITHOUT CONTRAST  TECHNIQUE: Contiguous axial images were obtained from the base of  the skull through the vertex without intravenous contrast.  COMPARISON:  CT head and MRI brain dated 09/05/2013. MRA brain dated 09/29/2013.  FINDINGS: No evidence of parenchymal hemorrhage or extra-axial fluid collection. No mass lesion, mass effect, or  midline shift.  No CT evidence of acute infarction.  Vague subcortical hypodensity in the posterior left frontal lobe (series 2/image 21), chronic.  The visualized paranasal sinuses are essentially clear. The mastoid air cells are unopacified.  No evidence of calvarial fracture.  IMPRESSION: No evidence of acute intracranial abnormality.  Vague subcortical hypodensity in posterior left frontal lobe, chronic.   Electronically Signed   By: Julian Hy M.D.   On: 04/06/2014 19:58    Assessment/Plan: Etiology for patient's focal deficits as described above is unclear. Migraine equivalent is suspected. Patient has no signs of acute stroke per MRI study. I suspect there are psychophysiologic factors and to the patient's symptomatology, as well.  Recommendations: 1. Aspirin 81 mg per day 2. Admit overnight for observation with telemetry 3. Echocardiogram with contrast 4. Studies to rule out hypercoagulable state  We will continue to follow this patient with you.  C.R. Nicole Kindred, MD [facility] [phone]  04/06/2014, 9:02 PM

## 2014-04-06 NOTE — ED Notes (Signed)
Dr. Arnoldo Morale in to see pt at this time.

## 2014-04-07 DIAGNOSIS — G459 Transient cerebral ischemic attack, unspecified: Secondary | ICD-10-CM

## 2014-04-07 DIAGNOSIS — H539 Unspecified visual disturbance: Secondary | ICD-10-CM

## 2014-04-07 DIAGNOSIS — G43909 Migraine, unspecified, not intractable, without status migrainosus: Secondary | ICD-10-CM

## 2014-04-07 DIAGNOSIS — E876 Hypokalemia: Secondary | ICD-10-CM

## 2014-04-07 DIAGNOSIS — I517 Cardiomegaly: Secondary | ICD-10-CM

## 2014-04-07 DIAGNOSIS — R5383 Other fatigue: Secondary | ICD-10-CM

## 2014-04-07 DIAGNOSIS — R5381 Other malaise: Secondary | ICD-10-CM

## 2014-04-07 DIAGNOSIS — R209 Unspecified disturbances of skin sensation: Secondary | ICD-10-CM

## 2014-04-07 DIAGNOSIS — R29898 Other symptoms and signs involving the musculoskeletal system: Secondary | ICD-10-CM

## 2014-04-07 DIAGNOSIS — E282 Polycystic ovarian syndrome: Secondary | ICD-10-CM

## 2014-04-07 DIAGNOSIS — M6281 Muscle weakness (generalized): Secondary | ICD-10-CM

## 2014-04-07 LAB — RAPID URINE DRUG SCREEN, HOSP PERFORMED
Amphetamines: NOT DETECTED
Barbiturates: NOT DETECTED
Benzodiazepines: NOT DETECTED
Cocaine: NOT DETECTED
Opiates: NOT DETECTED
Tetrahydrocannabinol: NOT DETECTED

## 2014-04-07 LAB — LIPID PANEL
CHOLESTEROL: 86 mg/dL (ref 0–200)
HDL: 49 mg/dL (ref >39)
LDL Cholesterol: 27 mg/dL (ref 0–99)
TRIGLYCERIDES: 52 mg/dL (ref <150)
Total CHOL/HDL Ratio: 1.8 RATIO
VLDL: 10 mg/dL (ref 0–40)

## 2014-04-07 LAB — HEMOGLOBIN A1C
HEMOGLOBIN A1C: 5.5 % (ref <5.7)
Mean Plasma Glucose: 111 mg/dL (ref <117)

## 2014-04-07 MED ORDER — POTASSIUM CHLORIDE CRYS ER 20 MEQ PO TBCR
40.0000 meq | EXTENDED_RELEASE_TABLET | Freq: Once | ORAL | Status: AC
  Administered 2014-04-07: 40 meq via ORAL
  Filled 2014-04-07: qty 2

## 2014-04-07 NOTE — Progress Notes (Signed)
[facility]                                                                              Patient Demographics  Krystal Ferguson, is a 23 y.o. female, DOB - September 27, 1991, RCV:[id number]  Admit date - 04/06/2014   Admitting Physician Theressa Millard, MD  Outpatient Primary MD for the patient is Mayra Neer, MD  LOS - 1   Chief Complaint  Patient presents with  . Numbness        Assessment & Plan   Left-sided weakness -possible TIA versus psychogenic etiology -CT of the head: No evidence of acute intracranial abnormality -MRI of the brain: No acute intracranial process -Neurology consulted -Pending echocardiogram, carotid ultrasound -Hemoglobin A1c: Pending -Lipid Panel: 86/52/49/27  Migraine headache -Currently on no home medications, denies headache  Polycystic ovarian disease -Continue Lo Loestrin  Hypokalemia -Repleted, continue to monitor BMP  Anxiety/depression -Will consult psychiatry for further patient -Patient admits to being over worked and anxious  Code Status: Full  Family Communication: Mother at bedside.  Disposition Plan: Admitted, likely discharge once workup is complete  Time Spent in minutes   30 minutes  Procedures   Consults   Neurology Psychiatry  DVT Prophylaxis  Lovenox  Lab Results  Component Value Date   PLT 244 04/06/2014    Medications  Scheduled Meds: . aspirin  325 mg Oral Daily  . enoxaparin (LOVENOX) injection  40 mg Subcutaneous Q24H  . multivitamin with minerals  1 tablet Oral q morning - 10a  . Norethindrone-Ethinyl Estradiol-Fe Biphas  1 tablet Oral Daily   Continuous Infusions: . sodium chloride 100 mL/hr at 04/07/14 0040   PRN Meds:.acetaminophen, HYDROmorphone (DILAUDID) injection, ondansetron (ZOFRAN) IV  Antibiotics    Anti-infectives   None     Subjective:   Krystal Ferguson seen and examined today. She continues to complain of left-sided arm weakness as well as facial numbness. She  does admit to being under a lot of stress recently with her job and school. Patient denies headache at this time.  Objective:   Filed Vitals:   04/07/14 0210 04/07/14 0402 04/07/14 0605 04/07/14 0802  BP: 103/49 101/56 105/57 95/61  Pulse: 82 64 64 63  Temp: 98.1 F (36.7 C) 97.9 F (36.6 C) 97.8 F (36.6 C) 98.1 F (36.7 C)  TempSrc:   Oral Oral  Resp: 18 18 20 20   Height:      Weight:      SpO2: 96% 98% 97% 99%    Wt Readings from Last 3 Encounters:  04/06/14 102.7 kg (226 lb 6.6 oz)  12/03/13 103.42 kg (228 lb)  11/05/13 99.791 kg (220 lb) (99%*, Z = 2.21)   * Growth percentiles are based on CDC 2-20 Years data.    No intake or output data in the 24 hours ending 04/07/14 0836  Exam  General: Well developed, well nourished, NAD, appears stated age  HEENT: NCAT, PERRLA, EOMI, Anicteic Sclera, mucous membranes moist.   Neck: Supple, no JVD, no masses  Cardiovascular: S1 S2 auscultated, no rubs, murmurs or gallops. Regular rate and rhythm.  Respiratory: Clear to auscultation bilaterally with equal chest rise  Abdomen: Soft, obese, nontender, nondistended, +  bowel sounds  Extremities: warm dry without cyanosis clubbing or edema  Neuro: AAOx3, cranial nerves grossly intact. Strength 5/5 in patient's upper and lower extremities bilaterally  Skin: Without rashes exudates or nodules  Psych: Anxious affect and demeanor with intact judgement and insight  Data Review   Micro Results No results found for this or any previous visit (from the past 240 hour(s)).  Radiology Reports Ct Head (brain) Wo Contrast  04/06/2014   CLINICAL DATA:  Left face/arm numbness  EXAM: CT HEAD WITHOUT CONTRAST  TECHNIQUE: Contiguous axial images were obtained from the base of the skull through the vertex without intravenous contrast.  COMPARISON:  CT head and MRI brain dated 09/05/2013. MRA brain dated 09/29/2013.  FINDINGS: No evidence of parenchymal hemorrhage or extra-axial fluid  collection. No mass lesion, mass effect, or midline shift.  No CT evidence of acute infarction.  Vague subcortical hypodensity in the posterior left frontal lobe (series 2/image 21), chronic.  The visualized paranasal sinuses are essentially clear. The mastoid air cells are unopacified.  No evidence of calvarial fracture.  IMPRESSION: No evidence of acute intracranial abnormality.  Vague subcortical hypodensity in posterior left frontal lobe, chronic.   Electronically Signed   By: Julian Hy M.D.   On: 04/06/2014 19:58   Mr Brain Wo Contrast  04/06/2014   CLINICAL DATA:  Left facial numbness starting this morning, mild left arm drift.  EXAM: MRI HEAD WITHOUT CONTRAST  TECHNIQUE: Multiplanar, multiecho pulse sequences of the brain and surrounding structures were obtained without intravenous contrast.  COMPARISON:  CT HEAD W/O CM dated 04/06/2014; MR HEAD WO/W CM dated 09/05/2013  FINDINGS: No reduced diffusion to suggest acute ischemia nor hyperacute demyelination. There is susceptibility artifact to suggest hemorrhage.  Less than 5 supratentorial white matter subcentimeter T2 hyperintensities are were present previously, in addition to an ovoid 10 mm T2 hyperintensity which loses signal on FLAIR sequence within the posterior left frontal lobe, unchanged. No new white matter lesions. No mass effect or midline shift. The ventricles and sulci are normal for patient's age.  No abnormal extra-axial fluid collections. Normal major intracranial vascular flow voids seen at the skull base.  Visualized paranasal sinuses and mastoid air cells are well aerated. Ocular globes and orbital contents are unremarkable. No abnormal sellar expansion. No cerebellar tonsillar ectopia. No suspicious calvarial bone marrow signal.  IMPRESSION: No acute intracranial process. If clinical concern for subtle infectious/inflammatory process, contrast-enhanced sequences would be more sensitive.  Stable appearance of the head: Less than 5  subcentimeter supratentorial white matter lesions are nonspecific, and could reflect sequelae of migraine disorder, vasculopathy, less likely chronic ischemia; this is an atypical distribution for classic demyelination. No parenchymal brain volume loss for age.   Electronically Signed   By: Elon Alas   On: 04/06/2014 22:09    CBC  Recent Labs Lab 04/06/14 1914  WBC 9.1  HGB 13.0  HCT 41.4  PLT 244  MCV 85.7  MCH 26.9  MCHC 31.4  RDW 13.8  LYMPHSABS 2.3  MONOABS 0.7  EOSABS 0.1  BASOSABS 0.0    Chemistries   Recent Labs Lab 04/06/14 1914  NA 141  K 3.4*  CL 105  CO2 25  GLUCOSE 87  BUN 12  CREATININE 0.73  CALCIUM 9.7  AST 15  ALT 10  ALKPHOS 56  BILITOT 0.3   ------------------------------------------------------------------------------------------------------------------ estimated creatinine clearance is 128.4 ml/min (by C-G formula based on Cr of 0.73). ------------------------------------------------------------------------------------------------------------------ No results found for this basename: HGBA1C,  in the last 72 hours ------------------------------------------------------------------------------------------------------------------  Recent Labs  04/07/14 0500  CHOL 86  HDL 49  LDLCALC 27  TRIG 52  CHOLHDL 1.8   ------------------------------------------------------------------------------------------------------------------ No results found for this basename: TSH, T4TOTAL, FREET3, T3FREE, THYROIDAB,  in the last 72 hours ------------------------------------------------------------------------------------------------------------------ No results found for this basename: VITAMINB12, FOLATE, FERRITIN, TIBC, IRON, RETICCTPCT,  in the last 72 hours  Coagulation profile No results found for this basename: INR, PROTIME,  in the last 168 hours  No results found for this basename: DDIMER,  in the last 72 hours  Cardiac Enzymes No results  found for this basename: CK, CKMB, TROPONINI, MYOGLOBIN,  in the last 168 hours ------------------------------------------------------------------------------------------------------------------ No components found with this basename: POCBNP,     Stark Aguinaga D.O. on 04/07/2014 at 8:36 AM  Between 7am to 7pm - Pager - [phone]  After 7pm go to [email or web address] - password [email or web address]  And look for the night coverage person covering for me after hours  [facility] Office  [phone]
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
## 2014-04-07 NOTE — Discharge Summary (Signed)
Physician Discharge Summary  Krystal Ferguson WNI:[id number] DOB: 01-14-1991 DOA: 04/06/2014  PCP: Mayra Neer, MD  Admit date: 04/06/2014 Discharge date: 04/08/2014  Time spent: 30 minutes  Recommendations for Outpatient Follow-up:  Patient will be discharged to home. She will need to follow up with her primary care physician within one week of discharge as well as her neurologist. Patient was urged to seek psychiatric counseling.   Discharge Diagnoses:  Principal Problem:   Left-sided weakness Active Problems:   Polycystic ovaries   Migraine headache   Hypokalemia   anxiety/depression  Discharge Condition: Stable  Diet recommendation: Regular  Filed Weights   04/06/14 1909 04/06/14 2344  Weight: 102.683 kg (226 lb 6 oz) 102.7 kg (226 lb 6.6 oz)    History of present illness:  Krystal Ferguson is a 23 y.o. female who presents to the ED with complaints of left face and left arm numbness and weakness noticed at 8 am. She also reported having Visual changes in the left eye that are intermittent that is like seeing through a grey veil. She has a history of migraine HAs, but denied having a headache associated with her symptoms at this time. She was hospitalized in 08/2013 for similar symptoms but were associated with a migraine at that time and she was diagnosed with a complex migraine. A CT Scan and MRI of the Brain were performed and were negative for acute findings, and unchanged from 08/2013. She was seen by Neurology Dr. Nicole Kindred.   Hospital Course:  Left-sided weakness  -possible TIA versus psychogenic etiology  -CT of the head: No evidence of acute intracranial abnormality  -MRI of the brain: No acute intracranial process  -Neurology consulted and recommended outpatient neurology followup. -Echocardiogram: EF 60% -Carotid Doppler:1-39% internal carotid artery stenosis bilaterally. Vertebral arteries are patent with antegrade flow -Hemoglobin A1c: 5.5 -Lipid Panel:  86/52/49/27   Migraine headache  -Currently on no home medications, denies headache   Polycystic ovarian disease  -Continue Lo Loestrin   Hypokalemia  -Resolved  Anxiety/depression  -Psychiatry consulted -Patient admits to being over worked and anxious  -Urged patient to seek psychiatric counseling.  Procedures: Echocardiogram Study Conclusions  - Left ventricle: The cavity size was normal. Wall thickness was increased in a pattern of mild LVH. The estimated ejection fraction was 60%. Wall motion was normal; there were no regional wall motion abnormalities. - Right ventricle: The cavity size was normal. Systolic function was normal. Impressions:  No cardiac source of embolism was identified, but cannot be ruled out on the basis of this examination.  Carotid Duplex (Doppler) has been completed.  Findings suggest 1-39% internal carotid artery stenosis bilaterally. Vertebral arteries are patent with antegrade flow.  Consultations: Neurology Psychiatry  Discharge Exam: Filed Vitals:   04/08/14 0939  BP: 97/61  Pulse: 63  Temp: 98 F (36.7 C)  Resp: 18   Exam  General: Well developed, well nourished, NAD, appears stated age  HEENT: NCAT, mucous membranes moist.  Neck: Supple, no JVD, no masses  Cardiovascular: S1 S2 auscultated, no rubs, murmurs or gallops. Regular rate and rhythm.  Respiratory: Clear to auscultation bilaterally with equal chest rise  Abdomen: Soft, obese, nontender, nondistended, + bowel sounds  Extremities: warm dry without cyanosis clubbing or edema  Neuro: AAOx3, no focal deficits Skin: Without rashes exudates or nodules  Psych: Anxious affect and demeanor with intact judgement and insight  Discharge Instructions      Discharge Orders   Future Orders Complete By Expires   Discharge  instructions  As directed    Increase activity slowly  As directed        Medication List         ibuprofen 200 MG tablet  Commonly known as:  ADVIL,MOTRIN    Take 400 mg by mouth every 8 (eight) hours as needed for mild pain.     LO LOESTRIN FE 1 MG-10 MCG / 10 MCG tablet  Generic drug:  Norethindrone-Ethinyl Estradiol-Fe Biphas  Take 1 tablet by mouth every morning.     multivitamin with minerals Tabs tablet  Take 1 tablet by mouth every morning.     ondansetron 4 MG tablet  Commonly known as:  ZOFRAN  Take 1 tablet (4 mg total) by mouth every 8 (eight) hours as needed for nausea or vomiting.       Allergies  Allergen Reactions  . Citrus Hives and Rash   Follow-up Information   Follow up with SHAW,KIMBERLEE, MD. Schedule an appointment as soon as possible for a visit in 1 week. [facility] follow up)    Specialty:  Family Medicine   Contact information:   [address] [place] [address] [phone]       Follow up with JAFFE, [place]um[place], DO. Schedule an appointment as soon as possible for a visit in 1 week. (hospital follow up)    Specialty:  Neurology   Contact information:   [place] [address] [phone]        The results of significant diagnostics from this hospitalization (including imaging, microbiology, ancillary and laboratory) are listed below for reference.    Significant Diagnostic Studies: Ct Head (brain) Wo Contrast  04/06/2014   CLINICAL DATA:  Left face/arm numbness  EXAM: CT HEAD WITHOUT CONTRAST  TECHNIQUE: Contiguous axial images were obtained from the base of the skull through the vertex without intravenous contrast.  COMPARISON:  CT head and MRI brain dated 09/05/2013. MRA brain dated 09/29/2013.  FINDINGS: No evidence of parenchymal hemorrhage or extra-axial fluid collection. No mass lesion, mass effect, or midline shift.  No CT evidence of acute infarction.  Vague subcortical hypodensity in the posterior left frontal lobe (series 2/image 21), chronic.  The visualized paranasal sinuses are essentially clear. The mastoid air cells are unopacified.  No evidence of  calvarial fracture.  IMPRESSION: No evidence of acute intracranial abnormality.  Vague subcortical hypodensity in posterior left frontal lobe, chronic.   Electronically Signed   By: Julian Hy M.D.   On: 04/06/2014 19:58   Mr Brain Wo Contrast  04/06/2014   CLINICAL DATA:  Left facial numbness starting this morning, mild left arm drift.  EXAM: MRI HEAD WITHOUT CONTRAST  TECHNIQUE: Multiplanar, multiecho pulse sequences of the brain and surrounding structures were obtained without intravenous contrast.  COMPARISON:  CT HEAD W/O CM dated 04/06/2014; MR HEAD WO/W CM dated 09/05/2013  FINDINGS: No reduced diffusion to suggest acute ischemia nor hyperacute demyelination. There is susceptibility artifact to suggest hemorrhage.  Less than 5 supratentorial white matter subcentimeter T2 hyperintensities are were present previously, in addition to an ovoid 10 mm T2 hyperintensity which loses signal on FLAIR sequence within the posterior left frontal lobe, unchanged. No new white matter lesions. No mass effect or midline shift. The ventricles and sulci are normal for patient's age.  No abnormal extra-axial fluid collections. Normal major intracranial vascular flow voids seen at the skull base.  Visualized paranasal sinuses and mastoid air cells are well aerated. Ocular globes and orbital  contents are unremarkable. No abnormal sellar expansion. No cerebellar tonsillar ectopia. No suspicious calvarial bone marrow signal.  IMPRESSION: No acute intracranial process. If clinical concern for subtle infectious/inflammatory process, contrast-enhanced sequences would be more sensitive.  Stable appearance of the head: Less than 5 subcentimeter supratentorial white matter lesions are nonspecific, and could reflect sequelae of migraine disorder, vasculopathy, less likely chronic ischemia; this is an atypical distribution for classic demyelination. No parenchymal brain volume loss for age.   Electronically Signed   By: Elon Alas   On: 04/06/2014 22:09    Microbiology: No results found for this or any previous visit (from the past 240 hour(s)).   Labs: Basic Metabolic Panel:  Recent Labs Lab 04/06/14 1914 04/08/14 0521  NA 141 142  K 3.4* 4.3  CL 105 106  CO2 25 23  GLUCOSE 87 104*  BUN 12 13  CREATININE 0.73 0.69  CALCIUM 9.7 9.3   Liver Function Tests:  Recent Labs Lab 04/06/14 1914  AST 15  ALT 10  ALKPHOS 56  BILITOT 0.3  PROT 7.8  ALBUMIN 4.1   No results found for this basename: LIPASE, AMYLASE,  in the last 168 hours No results found for this basename: AMMONIA,  in the last 168 hours CBC:  Recent Labs Lab 04/06/14 1914  WBC 9.1  NEUTROABS 5.9  HGB 13.0  HCT 41.4  MCV 85.7  PLT 244   Cardiac Enzymes: No results found for this basename: CKTOTAL, CKMB, CKMBINDEX, TROPONINI,  in the last 168 hours BNP: BNP (last 3 results) No results found for this basename: PROBNP,  in the last 8760 hours CBG: No results found for this basename: GLUCAP,  in the last 168 hours     Signed:  Cristal Ford  [facility] 04/08/2014, 11:23 AM

## 2014-04-07 NOTE — Progress Notes (Signed)
*  PRELIMINARY RESULTS* Vascular Ultrasound Carotid Duplex (Doppler) has been completed.   Findings suggest 1-39% internal carotid artery stenosis bilaterally. Vertebral arteries are patent with antegrade flow.  04/07/2014 12:40 PM Maudry Mayhew, RVT, RDCS, RDMS

## 2014-04-07 NOTE — Progress Notes (Signed)
UR completed 

## 2014-04-07 NOTE — Progress Notes (Signed)
NEURO HOSPITALIST PROGRESS NOTE   SUBJECTIVE:                                                                                                                        Resting comfortably in bed, no new complains but said that " the numbness is still there'. Family at the bedside. MRI brain: No acute intracranial process. Stable appearance of non specific < 5 subcentimeter supratentorial white matter lesions. Preliminary results CUS showed no hemodynamically significant carotid stenosis. TTE unremarkable.  OBJECTIVE:                                                                                                                           Vital signs in last 24 hours: Temp:  [97.8 F (36.6 C)-98.6 F (37 C)] 97.9 F (36.6 C) (04/22 0938) Pulse Rate:  [63-82] 66 (04/22 0938) Resp:  [18-20] 20 (04/22 0938) BP: (95-132)/(49-76) 107/67 mmHg (04/22 0938) SpO2:  [96 %-99 %] 98 % (04/22 0938) Weight:  [102.683 kg (226 lb 6 oz)-102.7 kg (226 lb 6.6 oz)] 102.7 kg (226 lb 6.6 oz) (04/21 2344)  Intake/Output from previous day: 04/21 0701 - 04/22 0700 In: 633.3 [I.V.:633.3] Out: -  Intake/Output this shift: Total I/O In: 660 [P.O.:360; I.V.:300] Out: -  Nutritional status: General  Past Medical History  Diagnosis Date  . Polycystic ovaries   . Chest pain   . Migraine    Neurologic Exam:  Mental Status:  Alert, oriented, thought content appropriate. Speech fluent without evidence of aphasia. Able to follow commands without difficulty.  Cranial Nerves:  II-Visual fields were normal.  III/IV/VI-Pupils were equal and reacted. Extraocular movements were full and conjugate.  V/VII-reduced perception of tactile sensation over left side of the face compared to the right; equivocal mild left lower facial weakness.  VIII-normal.  X-normal speech and symmetrical palatal movement.  Motor: Somewhat nonphysiologic mild drift of left upper extremity noted; motor  exam is otherwise unremarkable except for reduced grip strength on the left compared to the right.  Sensory: Reduced perception of tactile sensation extremity compared to right upper extremity.  Deep Tendon Reflexes: 2+ and symmetric.  Plantars: Flexor bilaterally  Cerebellar: Normal finger-to-nose testing   Lab Results: Lab Results  Component  Value Date/Time   CHOL 86 04/07/2014  5:00 AM   Lipid Panel  Recent Labs  04/07/14 0500  CHOL 86  TRIG 52  HDL 49  CHOLHDL 1.8  VLDL 10  LDLCALC 27    Studies/Results: Ct Head (brain) Wo Contrast  04/06/2014   CLINICAL DATA:  Left face/arm numbness  EXAM: CT HEAD WITHOUT CONTRAST  TECHNIQUE: Contiguous axial images were obtained from the base of the skull through the vertex without intravenous contrast.  COMPARISON:  CT head and MRI brain dated 09/05/2013. MRA brain dated 09/29/2013.  FINDINGS: No evidence of parenchymal hemorrhage or extra-axial fluid collection. No mass lesion, mass effect, or midline shift.  No CT evidence of acute infarction.  Vague subcortical hypodensity in the posterior left frontal lobe (series 2/image 21), chronic.  The visualized paranasal sinuses are essentially clear. The mastoid air cells are unopacified.  No evidence of calvarial fracture.  IMPRESSION: No evidence of acute intracranial abnormality.  Vague subcortical hypodensity in posterior left frontal lobe, chronic.   Electronically Signed   By: Julian Hy M.D.   On: 04/06/2014 19:58   Mr Brain Wo Contrast  04/06/2014   CLINICAL DATA:  Left facial numbness starting this morning, mild left arm drift.  EXAM: MRI HEAD WITHOUT CONTRAST  TECHNIQUE: Multiplanar, multiecho pulse sequences of the brain and surrounding structures were obtained without intravenous contrast.  COMPARISON:  CT HEAD W/O CM dated 04/06/2014; MR HEAD WO/W CM dated 09/05/2013  FINDINGS: No reduced diffusion to suggest acute ischemia nor hyperacute demyelination. There is susceptibility  artifact to suggest hemorrhage.  Less than 5 supratentorial white matter subcentimeter T2 hyperintensities are were present previously, in addition to an ovoid 10 mm T2 hyperintensity which loses signal on FLAIR sequence within the posterior left frontal lobe, unchanged. No new white matter lesions. No mass effect or midline shift. The ventricles and sulci are normal for patient's age.  No abnormal extra-axial fluid collections. Normal major intracranial vascular flow voids seen at the skull base.  Visualized paranasal sinuses and mastoid air cells are well aerated. Ocular globes and orbital contents are unremarkable. No abnormal sellar expansion. No cerebellar tonsillar ectopia. No suspicious calvarial bone marrow signal.  IMPRESSION: No acute intracranial process. If clinical concern for subtle infectious/inflammatory process, contrast-enhanced sequences would be more sensitive.  Stable appearance of the head: Less than 5 subcentimeter supratentorial white matter lesions are nonspecific, and could reflect sequelae of migraine disorder, vasculopathy, less likely chronic ischemia; this is an atypical distribution for classic demyelination. No parenchymal brain volume loss for age.   Electronically Signed   By: Elon Alas   On: 04/06/2014 22:09    MEDICATIONS                                                                                                                       I have reviewed the patient's current medications.  ASSESSMENT/PLAN:  23 y.o. female with a history of migraine headaches and polycystic ovaries presenting with numbness involving left face and upper extremity, left visual changes and mild left upper extremity weakness. Neuro-exam is inconsistent. MRI brain is unimpressive and she had recent unrevealing MS work up. In addition, TTE and CUS are unremarkable. No further  inpatient neurological work up suggested at this moment. She should be follow by neurology as outpatient. Will sign off.   Dorian Pod, MD [facility] [phone]  04/07/2014, 1:43 PM

## 2014-04-07 NOTE — Progress Notes (Signed)
  Echocardiogram 2D Echocardiogram has been performed.  Krystal Ferguson 04/07/2014, 1:04 PM

## 2014-04-08 DIAGNOSIS — F411 Generalized anxiety disorder: Secondary | ICD-10-CM

## 2014-04-08 LAB — BASIC METABOLIC PANEL
BUN: 13 mg/dL (ref 6–23)
CO2: 23 mEq/L (ref 19–32)
Calcium: 9.3 mg/dL (ref 8.4–10.5)
Chloride: 106 mEq/L (ref 96–112)
Creatinine, Ser: 0.69 mg/dL (ref 0.50–1.10)
GFR calc Af Amer: >90 mL/min (ref >90)
Glucose, Bld: 104 mg/dL — ABNORMAL HIGH (ref 70–99)
POTASSIUM: 4.3 meq/L (ref 3.7–5.3)
SODIUM: 142 meq/L (ref 137–147)

## 2014-04-08 MED ORDER — ONDANSETRON 4 MG PO TBDP
ORAL_TABLET | ORAL | Status: AC
  Administered 2014-04-08: 4 mg
  Filled 2014-04-08: qty 1

## 2014-04-08 MED ORDER — ONDANSETRON HCL 4 MG PO TABS: 4.0000 mg | ORAL_TABLET | Freq: Three times a day (TID) | ORAL | Status: DC | PRN

## 2014-04-08 NOTE — Clinical Social Work Psych Note (Signed)
Psych CSW was consulted re: anxiety.  Psych CSW evaluated pt and provided psychosocial education and resource navigation education as well as printed material for her keeping.  Full assessment to follow.  Nonnie Done, [place] [phone]  Clinical Social Work

## 2014-04-08 NOTE — Discharge Instructions (Signed)
Generalized Anxiety Disorder Generalized anxiety disorder (GAD) is a mental disorder. It interferes with life functions, including relationships, work, and school. GAD is different from normal anxiety, which everyone experiences at some point in their lives in response to specific life events and activities. Normal anxiety actually helps [place] prepare for and get through these life events and activities. Normal anxiety goes away after the event or activity is over.  GAD causes anxiety that is not necessarily related to specific events or activities. It also causes excess anxiety in proportion to specific events or activities. The anxiety associated with GAD is also difficult to control. GAD can vary from mild to severe. People with severe GAD can have intense waves of anxiety with physical symptoms (panic attacks).  SYMPTOMS The anxiety and worry associated with GAD are difficult to control. This anxiety and worry are related to many life events and activities and also occur more days than not for 6 months or longer. People with GAD also have three or more of the following symptoms (one or more in children):  Restlessness.   Fatigue.  Difficulty concentrating.   Irritability.  Muscle tension.  Difficulty sleeping or unsatisfying sleep. DIAGNOSIS GAD is diagnosed through an assessment by your caregiver. Your caregiver will ask you questions aboutyour mood,physical symptoms, and events in your life. Your caregiver may ask you about your medical history and use of alcohol or drugs, including prescription medications. Your caregiver may also do a physical exam and blood tests. Certain medical conditions and the use of certain substances can cause symptoms similar to those associated with GAD. Your caregiver may refer you to a mental health specialist for further evaluation. TREATMENT The following therapies are usually used to treat GAD:   Medication Antidepressant medication usually is  prescribed for long-term daily control. Antianxiety medications may be added in severe cases, especially when panic attacks occur.   Talk therapy (psychotherapy) Certain types of talk therapy can be helpful in treating GAD by providing support, education, and guidance. A form of talk therapy called cognitive behavioral therapy can teach you healthy ways to think about and react to daily life events and activities.  Stress managementtechniques These include yoga, meditation, and exercise and can be very helpful when they are practiced regularly. A mental health specialist can help determine which treatment is best for you. Some people see improvement with one therapy. However, other people require a combination of therapies. Document Released: 03/30/2013 Document Reviewed: 03/30/2013 [facility] Patient Information 2014 [place], [place]. Stress Management Stress is a state of physical or mental tension that often results from changes in your life or normal routine. Some common causes of stress are:  Death of a loved one.  Injuries or severe illnesses.  Getting fired or changing jobs.  Moving into a new home. Other causes may be:  Sexual problems.  Business or financial losses.  Taking on a large debt.  Regular conflict with someone at home or at work.  Constant tiredness from lack of sleep. It is not just bad things that are stressful. It may be stressful to:  Win the lottery.  Get married.  Buy a new car. The amount of stress that can be easily tolerated varies from person to person. Changes generally cause stress, regardless of the types of change. Too much stress can affect your health. It may lead to physical or emotional problems. Too little stress (boredom) may also become stressful. SUGGESTIONS TO REDUCE STRESS:  Talk things over with your family and friends.  It often is helpful to share your concerns and worries. If you feel your problem is serious, you may want to get  help from a professional counselor.  Consider your problems one at a time instead of lumping them all together. Trying to take care of everything at once may seem impossible. List all the things you need to do and then start with the most important one. Set a goal to accomplish 2 or 3 things each day. If you expect to do too many in a single day you will naturally fail, causing you to feel even more stressed.  Do not use alcohol or drugs to relieve stress. Although you may feel better for a short time, they do not remove the problems that caused the stress. They can also be habit forming.  Exercise regularly - at least 3 times per week. Physical exercise can help to relieve that "uptight" feeling and will relax you.  The shortest distance between despair and hope is often a good night's sleep.  Go to bed and get up on time allowing yourself time for appointments without being rushed.  Take a short "time-out" period from any stressful situation that occurs during the day. Close your eyes and take some deep breaths. Starting with the muscles in your face, tense them, hold it for a few seconds, then relax. Repeat this with the muscles in your neck, shoulders, hand, stomach, back and legs.  Take good care of yourself. Eat a balanced diet and get plenty of rest.  Schedule time for having fun. Take a break from your daily routine to relax. HOME CARE INSTRUCTIONS   Call if you feel overwhelmed by your problems and feel you can no longer manage them on your own.  Return immediately if you feel like hurting yourself or someone else. Document Released: 05/29/2001 Document Revised: 02/25/2012 Document Reviewed: 07/28/2013 [facility] Patient Information 2014 [place], [place].

## 2014-04-08 NOTE — Evaluation (Signed)
Physical Therapy Evaluation Patient Details Name: Krystal Ferguson MRN: [id number] DOB: [birth year] Today's Date: 04/08/2014   History of Present Illness  Krystal Ferguson is a 23 y.o. female who presents to the ED with complaints of left face and left arm numbness and weakness noticed at 8 am.  She also reports having Visual changes in the left eye that are intermittent that is like seeing through a grey veil.  She has a history of migraine HAs, but denies having a headache associated with her symptoms at this time.  She was hospitalized in 08/2013 for similar symptoms but were associated with a migraine at that time and she was diagnosed with a complex migraine.   A CT Scan and MRI of the Brain were performed and were negative for acute findings, and unchanged from 08/2013.  Clinical Impression  Pt independent with mobility, still presents with LUE numbness but reports that it is better then before. Educated patient on sensory stimulation and home management with numbness. Patient also reported some dizziness with positional changes this am, hallpike maneuver performed, negative. Educated patient on relaxation and gaze stabilization if dizziness sets in. No further needs. PT to sign off.             Precautions / Restrictions Restrictions Weight Bearing Restrictions: No      Mobility  Bed Mobility Overal bed mobility: Independent                Transfers Overall transfer level: Independent                  Ambulation/Gait Ambulation/Gait assistance: Independent              Stairs            Wheelchair Mobility    Modified Rankin (Stroke Patients Only) Modified Rankin (Stroke Patients Only) Pre-Morbid Rankin Score: No symptoms Modified Rankin: No significant disability     Balance Overall balance assessment: No apparent balance deficits (not formally assessed)                               Standardized Balance  Assessment Standardized Balance Assessment : Dynamic Gait Index   Dynamic Gait Index Level Surface: Normal Change in Gait Speed: Normal Gait with Horizontal Head Turns: Mild Impairment Gait with Vertical Head Turns: Normal Gait and Pivot Turn: Normal Step Over Obstacle: Normal Step Around Obstacles: Normal Steps: Normal Total Score: 23       Pertinent Vitals/Pain No pain, VSS, NAD    Home Living Family/patient expects to be discharged to:: Private residence Living Arrangements: Parent Available Help at Discharge: Family                  Prior Function Level of Independence: Independent               Hand Dominance   Dominant Hand: Right    Extremity/Trunk Assessment   Upper Extremity Assessment: LUE deficits/detail       LUE Deficits / Details: sensory deficits, numbness reported but improving             Communication   Communication: No difficulties  Cognition Arousal/Alertness: Awake/alert Behavior During Therapy: WFL for tasks assessed/performed Overall Cognitive Status: Within Functional Limits for tasks assessed                      General Comments General comments (skin integrity, edema, etc.):  Performed [employer] testing maneuver secondary to patient reporting symptoms of dizziness with positional changes, Negative for nystagmus. Patient did report some minor dizziness with quick head movements, educated patient on focal gaze to help resolve dizziness sensation.     Exercises        Assessment/Plan    PT Assessment Patent does not need any further PT services  PT Diagnosis     PT Problem List    PT Treatment Interventions     PT Goals (Current goals can be found in the Care Plan section) Acute Rehab PT Goals PT Goal Formulation: No goals set, d/c therapy     End of Session   Activity Tolerance: Patient tolerated treatment well Patient left: in bed;with family/visitor present Nurse Communication: Mobility  status         Time: 1221-1236 PT Time Calculation (min): 15 min   Charges:   PT Evaluation $Initial PT Evaluation Tier I: 1 Procedure PT Treatments $Gait Training: 8-22 mins   PT G Codes:          Duncan Dull 04/08/2014, 12:43 PM Alben Deeds, [place] DPT  [phone]

## 2014-04-08 NOTE — Progress Notes (Signed)
Patient complained of nausea and lightheadedness. BP taken and MD notified. Orders placed and carried out.

## 2014-04-08 NOTE — Progress Notes (Signed)
Patient is discharged from room 4N27 at this time. Alert and in stable condition. IV site d/c'd. Instructions read to patient and mother and understanding verbalized. Left unit ambulating with belongings at side.

## 2014-04-12 NOTE — Telephone Encounter (Signed)
Called patient to advise her to make a follow appt after visit to ED however I did not get a answer

## 2014-04-12 NOTE — Telephone Encounter (Signed)
She should make a followup appointment
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
## 2014-04-16 NOTE — Telephone Encounter (Signed)
Called patient and advised her to make follow up appt per Dr Tomi Likens asap

## 2014-08-07 ENCOUNTER — Encounter (HOSPITAL_COMMUNITY): Payer: Self-pay | Admitting: Emergency Medicine

## 2014-08-07 ENCOUNTER — Emergency Department (HOSPITAL_COMMUNITY)
Admission: EM | Admit: 2014-08-07 | Discharge: 2014-08-08 | Disposition: A | Discharge status: Home / Self Care | Payer: 59 | Attending: Emergency Medicine | Admitting: Emergency Medicine

## 2014-08-07 DIAGNOSIS — R112 Nausea with vomiting, unspecified: Secondary | ICD-10-CM | POA: Diagnosis not present

## 2014-08-07 DIAGNOSIS — Z8679 Personal history of other diseases of the circulatory system: Secondary | ICD-10-CM | POA: Insufficient documentation

## 2014-08-07 DIAGNOSIS — Z862 Personal history of diseases of the blood and blood-forming organs and certain disorders involving the immune mechanism: Secondary | ICD-10-CM | POA: Insufficient documentation

## 2014-08-07 DIAGNOSIS — Z8639 Personal history of other endocrine, nutritional and metabolic disease: Secondary | ICD-10-CM | POA: Insufficient documentation

## 2014-08-07 DIAGNOSIS — Z3202 Encounter for pregnancy test, result negative: Secondary | ICD-10-CM | POA: Insufficient documentation

## 2014-08-07 DIAGNOSIS — R1031 Right lower quadrant pain: Secondary | ICD-10-CM | POA: Diagnosis not present

## 2014-08-07 DIAGNOSIS — Z79899 Other long term (current) drug therapy: Secondary | ICD-10-CM | POA: Insufficient documentation

## 2014-08-07 DIAGNOSIS — R109 Unspecified abdominal pain: Secondary | ICD-10-CM

## 2014-08-07 NOTE — ED Notes (Signed)
Per pt she had a BM this evening, when she stood up she got a sharp stabbing pain all the way across her abdomen.  Pt rates pain 7/10 at this time.

## 2014-08-08 ENCOUNTER — Emergency Department (HOSPITAL_COMMUNITY): Payer: 59

## 2014-08-08 LAB — COMPREHENSIVE METABOLIC PANEL
ALBUMIN: 4 g/dL (ref 3.5–5.2)
ALT: 14 U/L (ref 0–35)
ANION GAP: 14 (ref 5–15)
AST: 16 U/L (ref 0–37)
Alkaline Phosphatase: 65 U/L (ref 39–117)
BILIRUBIN TOTAL: 0.2 mg/dL — AB (ref 0.3–1.2)
BUN: 13 mg/dL (ref 6–23)
CHLORIDE: 103 meq/L (ref 96–112)
CO2: 24 mEq/L (ref 19–32)
Calcium: 9.6 mg/dL (ref 8.4–10.5)
Creatinine, Ser: 0.86 mg/dL (ref 0.50–1.10)
GFR calc Af Amer: >90 mL/min (ref >90)
GFR calc non Af Amer: >90 mL/min (ref >90)
Glucose, Bld: 94 mg/dL (ref 70–99)
Potassium: 3.8 mEq/L (ref 3.7–5.3)
Sodium: 141 mEq/L (ref 137–147)
TOTAL PROTEIN: 8 g/dL (ref 6.0–8.3)

## 2014-08-08 LAB — URINALYSIS, ROUTINE W REFLEX MICROSCOPIC
Bilirubin Urine: NEGATIVE
Glucose, UA: NEGATIVE mg/dL
Hgb urine dipstick: NEGATIVE
KETONES UR: NEGATIVE mg/dL
LEUKOCYTES UA: NEGATIVE
NITRITE: NEGATIVE
Protein, ur: NEGATIVE mg/dL
Specific Gravity, Urine: 1.026 (ref 1.005–1.030)
Urobilinogen, UA: 0.2 mg/dL (ref 0.0–1.0)
pH: 6 (ref 5.0–8.0)

## 2014-08-08 LAB — CBC WITH DIFFERENTIAL/PLATELET
BASOS PCT: 0 % (ref 0–1)
Basophils Absolute: 0 10*3/uL (ref 0.0–0.1)
Eosinophils Absolute: 0.4 10*3/uL (ref 0.0–0.7)
Eosinophils Relative: 4 % (ref 0–5)
HEMATOCRIT: 40 % (ref 36.0–46.0)
HEMOGLOBIN: 13 g/dL (ref 12.0–15.0)
Lymphocytes Relative: 32 % (ref 12–46)
Lymphs Abs: 3.1 10*3/uL (ref 0.7–4.0)
MCH: 27.8 pg (ref 26.0–34.0)
MCHC: 32.5 g/dL (ref 30.0–36.0)
MCV: 85.5 fL (ref 78.0–100.0)
MONO ABS: 0.9 10*3/uL (ref 0.1–1.0)
MONOS PCT: 9 % (ref 3–12)
Neutro Abs: 5.3 10*3/uL (ref 1.7–7.7)
Neutrophils Relative %: 55 % (ref 43–77)
Platelets: 251 10*3/uL (ref 150–400)
RBC: 4.68 MIL/uL (ref 3.87–5.11)
RDW: 13.9 % (ref 11.5–15.5)
WBC: 9.7 10*3/uL (ref 4.0–10.5)

## 2014-08-08 LAB — LIPASE, BLOOD: LIPASE: 30 U/L (ref 11–59)

## 2014-08-08 LAB — PREGNANCY, URINE: Preg Test, Ur: NEGATIVE

## 2014-08-08 MED ORDER — OXYCODONE-ACETAMINOPHEN 5-325 MG PO TABS: 1.0000 | ORAL_TABLET | ORAL | Status: DC | PRN

## 2014-08-08 MED ORDER — MORPHINE SULFATE 4 MG/ML IJ SOLN
6.0000 mg | Freq: Once | INTRAMUSCULAR | Status: AC
  Administered 2014-08-08: 6 mg via INTRAVENOUS
  Filled 2014-08-08: qty 2

## 2014-08-08 MED ORDER — IOHEXOL 300 MG/ML  SOLN
50.0000 mL | Freq: Once | INTRAMUSCULAR | Status: AC | PRN
  Administered 2014-08-08: 50 mL via ORAL

## 2014-08-08 MED ORDER — ONDANSETRON HCL 4 MG/2ML IJ SOLN
4.0000 mg | Freq: Once | INTRAMUSCULAR | Status: AC
  Administered 2014-08-08: 4 mg via INTRAVENOUS
  Filled 2014-08-08: qty 2

## 2014-08-08 MED ORDER — KETOROLAC TROMETHAMINE 30 MG/ML IJ SOLN
30.0000 mg | Freq: Once | INTRAMUSCULAR | Status: AC
  Administered 2014-08-08: 30 mg via INTRAVENOUS
  Filled 2014-08-08: qty 1

## 2014-08-08 MED ORDER — IOHEXOL 300 MG/ML  SOLN
100.0000 mL | Freq: Once | INTRAMUSCULAR | Status: AC | PRN
  Administered 2014-08-08: 100 mL via INTRAVENOUS

## 2014-08-08 MED ORDER — ONDANSETRON 8 MG PO TBDP: 8.0000 mg | ORAL_TABLET | Freq: Three times a day (TID) | ORAL | Status: DC | PRN

## 2014-08-08 NOTE — ED Provider Notes (Signed)
CSN: [id number]     Arrival date & time 08/07/14  2245 History   First MD Initiated Contact with Patient 08/08/14 0231     Chief Complaint  Patient presents with  . Abdominal Pain     HPI Patient presents the emergency department with rather acute onset right lower quadrant abdominal pain that began just after urinating and having a bowel movement.  She developed nausea and vomiting.  She reported a sharp stabbing pain coming across her lower abdomen.  No urinary complaints.  No flank pain.  No vaginal complaints.  She does have a history of polycystic ovarian disease but states this feels different than her prior ovarian issues.  She states this pain is much higher   Past Medical History  Diagnosis Date  . Polycystic ovaries   . Chest pain   . Migraine    History reviewed. No pertinent past surgical history. Family History  Problem Relation Age of Onset  . Bladder Cancer Mother   . Kidney disease Father   . Heart disease Maternal Grandmother   . Heart disease Paternal Grandmother    History  Substance Use Topics  . Smoking status: Never Smoker   . Smokeless tobacco: Never Used  . Alcohol Use: Yes     Comment: occassional   OB History   Grav Para Term Preterm Abortions TAB SAB Ect Mult Living                 Review of Systems  All other systems reviewed and are negative.     Allergies  Citrus  Home Medications   Prior to Admission medications   Medication Sig Start Date End Date Taking? Authorizing Provider  ALPRAZolam (XANAX) 0.25 MG tablet Take 0.25 mg by mouth 3 (three) times daily as needed. 05/31/14  Yes Historical Provider, MD  ibuprofen (ADVIL,MOTRIN) 200 MG tablet Take 400 mg by mouth every 8 (eight) hours as needed for mild pain.   Yes Historical Provider, MD  Norethindrone-Ethinyl Estradiol-Fe (GENERESSE) 0.8-25 MG-MCG tablet Chew 1 tablet by mouth daily. 08/04/14  Yes Historical Provider, MD  sertraline (ZOLOFT) 50 MG tablet Take 50 mg by mouth daily.  07/23/14  Yes Historical Provider, MD  ondansetron (ZOFRAN ODT) 8 MG disintegrating tablet Take 1 tablet (8 mg total) by mouth every 8 (eight) hours as needed for nausea or vomiting. 08/08/14   Hoy Morn, MD  oxyCODONE-acetaminophen (PERCOCET/ROXICET) 5-325 MG per tablet Take 1 tablet by mouth every 4 (four) hours as needed for severe pain. 08/08/14   Hoy Morn, MD   BP 95/50  Pulse 69  Temp(Src) 98.5 F (36.9 C) (Oral)  Resp 20  Ht 5\' 3"  (1.6 m)  Wt 183 lb (83.008 kg)  BMI 32.43 kg/m2  SpO2 93%  LMP 07/25/2014 Physical Exam  Nursing note and vitals reviewed. Constitutional: She is oriented to person, place, and time. She appears well-developed and well-nourished. No distress.  HENT:  Head: Normocephalic and atraumatic.  Eyes: EOM are normal.  Neck: Normal range of motion.  Cardiovascular: Normal rate, regular rhythm and normal heart sounds.   Pulmonary/Chest: Effort normal and breath sounds normal.  Abdominal: Soft. She exhibits no distension.  Mild right-sided abdominal tenderness.  No low right pelvic tenderness  Musculoskeletal: Normal range of motion.  Neurological: She is alert and oriented to person, place, and time.  Skin: Skin is warm and dry.  Psychiatric: She has a normal mood and affect. Judgment normal.    ED Course  Procedures (  including critical care time) Labs Review Labs Reviewed  COMPREHENSIVE METABOLIC PANEL - Abnormal; Notable for the following:    Total Bilirubin 0.2 (*)    All other components within normal limits  CBC WITH DIFFERENTIAL  URINALYSIS, ROUTINE W REFLEX MICROSCOPIC  LIPASE, BLOOD  PREGNANCY, URINE    Imaging Review Ct Abdomen Pelvis W Contrast  08/08/2014   CLINICAL DATA:  Right-sided abdominal pain after bowel movement.  EXAM: CT ABDOMEN AND PELVIS WITH CONTRAST  TECHNIQUE: Multidetector CT imaging of the abdomen and pelvis was performed using the standard protocol following bolus administration of intravenous contrast.   CONTRAST:  76mL OMNIPAQUE IOHEXOL 300 MG/ML SOLN, 134mL OMNIPAQUE IOHEXOL 300 MG/ML SOLN  COMPARISON:  04/29/2012  FINDINGS: BODY WALL: Unremarkable.  LOWER CHEST: Unremarkable.  ABDOMEN/PELVIS:  Liver: Moderate hepatic steatosis.  Biliary: No evidence of biliary obstruction or stone.  Pancreas: Unremarkable.  Spleen: Unremarkable.  Adrenals: Unremarkable.  Kidneys and ureters: Probable developing cyst in the interpolar left kidney, currently measuring less than a cm.  Bladder: Unremarkable.  Reproductive: Peripherally enhancing 2.5 cm cyst in the right ovary is most compatible with a corpus luteum cyst. Bowel: No obstruction. Negative appendix.  Retroperitoneum: No mass or adenopathy.  Peritoneum: No ascites or pneumoperitoneum.  Vascular: No acute abnormality.  OSSEOUS: No acute abnormalities.  IMPRESSION: 1. No acute intra-abdominal findings. 2. Hepatic steatosis and other incidental findings as above.   Electronically Signed   By: Jorje Guild M.D.   On: 08/08/2014 04:51  I personally reviewed the imaging tests through PACS system I reviewed available ER/hospitalization records through the EMR    EKG Interpretation None      MDM   Final diagnoses:  Abdominal pain, unspecified abdominal location  Nausea and vomiting, vomiting of unspecified type    Patient is feeling better at this time.  Appendix is normal.  She's feeling much better.  May represent ovarian cystic-like pain.  Low suspicion for ovarian torsion given her comfort level at this time.  Discharge home in good condition.  She understands return to the ER for new or worsening symptoms    Hoy Morn, MD 08/08/14 [phone]

## 2014-08-08 NOTE — Discharge Instructions (Signed)

## 2014-08-08 NOTE — ED Notes (Signed)
Sudden onset bilateral lower abdominal pain around 2215 on 08/07/14 immediately after BM. Pt denies diarrhea or constipation, states it was a normal BM. Pain has since moved more into R abdomen. Pt denies nausea at this time, but states the pain was so bad when it first started that it made her nauseous.

## 2014-10-14 ENCOUNTER — Ambulatory Visit (INDEPENDENT_AMBULATORY_CARE_PROVIDER_SITE_OTHER): Payer: 59 | Admitting: Emergency Medicine

## 2014-10-14 VITALS — BP 118/82 | HR 76 | Temp 98.0°F | Resp 16 | Ht 62.75 in | Wt 247.6 lb

## 2014-10-14 DIAGNOSIS — G5682 Other specified mononeuropathies of left upper limb: Secondary | ICD-10-CM

## 2014-10-14 DIAGNOSIS — M79602 Pain in left arm: Secondary | ICD-10-CM

## 2014-10-14 MED ORDER — CYCLOBENZAPRINE HCL 10 MG PO TABS: 10.0000 mg | ORAL_TABLET | Freq: Three times a day (TID) | ORAL | Status: DC | PRN

## 2014-10-14 MED ORDER — METHYLPREDNISOLONE ACETATE 80 MG/ML IJ SUSP: 120.0000 mg | Freq: Once | INTRAMUSCULAR | Status: DC

## 2014-10-14 MED ORDER — NAPROXEN SODIUM 550 MG PO TABS: 550.0000 mg | ORAL_TABLET | Freq: Two times a day (BID) | ORAL | Status: AC

## 2014-10-14 MED ORDER — TRAMADOL HCL 50 MG PO TABS: 50.0000 mg | ORAL_TABLET | Freq: Three times a day (TID) | ORAL | Status: DC | PRN

## 2014-10-14 NOTE — Patient Instructions (Signed)

## 2014-10-14 NOTE — Progress Notes (Signed)
[facility] [address] [place] [address] [phone]  Date:  10/14/2014   Name:  Krystal Ferguson   DOB:  [birth year]   MRN:  [id number]  PCP:  Mayra Neer, MD    Chief Complaint: Shoulder Pain   History of Present Illness:  Krystal Ferguson is a 23 y.o. very pleasant female patient who presents with the following:  Pain in posterior left shoulder over past week with pain into left upper arm.  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 No neuro symptoms. No improvement with over the counter medications or other home remedies.  Denies other complaint or health concern today.   Patient Active Problem List   Diagnosis Date Noted  . Weakness 04/06/2014  . Left-sided weakness 04/06/2014  . Migraine headache 04/06/2014  . Hypokalemia 04/06/2014  . Polycystic ovaries   . Chest pain     Past Medical History  Diagnosis Date  . Polycystic ovaries   . Chest pain   . Migraine     History reviewed. No pertinent past surgical history.  History  Substance Use Topics  . Smoking status: Never Smoker   . Smokeless tobacco: Never Used  . Alcohol Use: Yes     Comment: occassional    Family History  Problem Relation Age of Onset  . Bladder Cancer Mother   . Kidney disease Father   . Heart disease Maternal Grandmother   . Heart disease Paternal Grandmother     Allergies  Allergen Reactions  . Citrus Hives and Rash    Medication list has been reviewed and updated.  Current Outpatient Prescriptions on File Prior to Visit  Medication Sig Dispense Refill  . ALPRAZolam (XANAX) 0.25 MG tablet Take 0.25 mg by mouth 3 (three) times daily as needed.      Marland Kitchen ibuprofen (ADVIL,MOTRIN) 200 MG tablet Take 400 mg by mouth every 8 (eight) hours as needed for mild pain.      . Norethindrone-Ethinyl Estradiol-Fe (GENERESSE) 0.8-25 MG-MCG tablet Chew 1 tablet by mouth daily.      . sertraline (ZOLOFT) 50 MG tablet Take 50 mg by mouth daily.      .  ondansetron (ZOFRAN ODT) 8 MG disintegrating tablet Take 1 tablet (8 mg total) by mouth every 8 (eight) hours as needed for nausea or vomiting.  10 tablet  0  . oxyCODONE-acetaminophen (PERCOCET/ROXICET) 5-325 MG per tablet Take 1 tablet by mouth every 4 (four) hours as needed for severe pain.  15 tablet  0   No current facility-administered medications on file prior to visit.    Review of Systems:  As per HPI, otherwise negative.    Physical Examination: Filed Vitals:   10/14/14 1454  BP: 118/82  Pulse: 76  Temp: 98 F (36.7 C)  Resp: 16   Filed Vitals:   10/14/14 1454  Height: 5' 2.75" (1.594 m)  Weight: 247 lb 9.6 oz (112.311 kg)   Body mass index is 44.2 kg/(m^2). Ideal Body Weight: Weight in (lb) to have BMI = 25: 139.7  . GEN: morbidly obese, NAD, Non-toxic, Alert & Oriented x 3 HEENT: Atraumatic, Normocephalic.  Ears and Nose: No external deformity. EXTR: No clubbing/cyanosis/edema NEURO: Normal gait.  PSYCH: Normally interactive. Conversant. Not depressed or anxious appearing.  Calm demeanor.  Left shoulder trigger point medial and superior to angle of scapula Full ROM and motor and neuro uppers  Assessment and Plan: Scapulocostal syndrome Anaprox Flexeril Tramadol Depo medrol injection  Signed,  Ellison Carwin, MD

## 2015-02-18 ENCOUNTER — Other Ambulatory Visit: Payer: Self-pay | Admitting: Family Medicine

## 2015-02-18 DIAGNOSIS — R110 Nausea: Secondary | ICD-10-CM

## 2015-02-24 ENCOUNTER — Encounter (INDEPENDENT_AMBULATORY_CARE_PROVIDER_SITE_OTHER): Payer: Self-pay

## 2015-02-24 ENCOUNTER — Ambulatory Visit
Admission: RE | Admit: 2015-02-24 | Discharge: 2015-02-24 | Disposition: A | Discharge status: Home / Self Care | Payer: 59 | Source: Ambulatory Visit | Attending: Family Medicine | Admitting: Family Medicine

## 2015-02-24 DIAGNOSIS — R110 Nausea: Secondary | ICD-10-CM

## 2015-03-09 ENCOUNTER — Other Ambulatory Visit: Payer: Self-pay | Admitting: Obstetrics and Gynecology

## 2015-03-10 LAB — CYTOLOGY - PAP

## 2015-08-31 ENCOUNTER — Ambulatory Visit (INDEPENDENT_AMBULATORY_CARE_PROVIDER_SITE_OTHER): Payer: 59 | Admitting: Family Medicine

## 2015-08-31 VITALS — BP 110/80 | HR 88 | Temp 98.0°F | Resp 18 | Ht 62.75 in | Wt 247.0 lb

## 2015-08-31 DIAGNOSIS — R3915 Urgency of urination: Secondary | ICD-10-CM

## 2015-08-31 DIAGNOSIS — R300 Dysuria: Secondary | ICD-10-CM | POA: Diagnosis not present

## 2015-08-31 DIAGNOSIS — M545 Low back pain: Secondary | ICD-10-CM

## 2015-08-31 LAB — POCT URINALYSIS DIPSTICK
Bilirubin, UA: NEGATIVE
Blood, UA: NEGATIVE
Glucose, UA: NEGATIVE
KETONES UA: NEGATIVE
LEUKOCYTES UA: NEGATIVE
NITRITE UA: NEGATIVE
PH UA: 5
Protein, UA: NEGATIVE
Spec Grav, UA: >=1.03
Urobilinogen, UA: 0.2

## 2015-08-31 LAB — POCT UA - MICROSCOPIC ONLY
Casts, Ur, LPF, POC: NEGATIVE
Crystals, Ur, HPF, POC: NEGATIVE
Mucus, UA: NEGATIVE
Yeast, UA: NEGATIVE

## 2015-08-31 NOTE — Patient Instructions (Signed)

## 2015-08-31 NOTE — Progress Notes (Signed)
Krystal Ferguson,IRWERXVQM, MD Chief Complaint  Patient presents with  . Urinary Tract Infection    C/O urgency, odor, scnat urine, & lower back pain x 2 days. Has only urinated twice since last night    Current Issues:  Presents with 2 days of dysuria, urinary urgency and urinary frequency Associated symptoms include:  cloudy urine, urinary frequency and urinary urgency Denies fever, chills, sweats, CVA pain, vaginal d/c.   There is no history of of similar symptoms. Sexually active:  Yes with female.   No concern for STI.  Prior to Admission medications   Medication Sig Start Date End Date Taking? Authorizing Provider  ALPRAZolam (XANAX) 0.25 MG tablet Take 0.25 mg by mouth 3 (three) times daily as needed. 05/31/14  Yes Historical Provider, MD  ibuprofen (ADVIL,MOTRIN) 200 MG tablet Take 400 mg by mouth every 8 (eight) hours as needed for mild pain.   Yes Historical Provider, MD  metFORMIN (GLUCOPHAGE) 500 MG tablet  08/19/15  Yes Historical Provider, MD  naproxen sodium (ANAPROX DS) 550 MG tablet Take 1 tablet (550 mg total) by mouth 2 (two) times daily with a meal. 10/14/14 10/14/15 Yes Roselee Culver, MD  Norethindrone-Ethinyl Estradiol-Fe (GENERESSE) 0.8-25 MG-MCG tablet Chew 1 tablet by mouth daily. 08/04/14  Yes Historical Provider, MD  cyclobenzaprine (FLEXERIL) 10 MG tablet Take 1 tablet (10 mg total) by mouth 3 (three) times daily as needed for muscle spasms. Patient not taking: Reported on 08/31/2015 10/14/14   Roselee Culver, MD  ondansetron (ZOFRAN ODT) 8 MG disintegrating tablet Take 1 tablet (8 mg total) by mouth every 8 (eight) hours as needed for nausea or vomiting. Patient not taking: Reported on 08/31/2015 08/08/14   Jola Schmidt, MD  oxyCODONE-acetaminophen (PERCOCET/ROXICET) 5-325 MG per tablet Take 1 tablet by mouth every 4 (four) hours as needed for severe pain. Patient not taking: Reported on 08/31/2015 08/08/14   Jola Schmidt, MD  sertraline (ZOLOFT) 50 MG tablet Take 50  mg by mouth daily. 07/23/14   Historical Provider, MD  traMADol (ULTRAM) 50 MG tablet Take 1 tablet (50 mg total) by mouth every 8 (eight) hours as needed. Patient not taking: Reported on 08/31/2015 10/14/14   Roselee Culver, MD    Review of Systems:Per HPI, otherwise 12 point negative   PE:  BP 110/80 mmHg  Pulse 88  Temp(Src) 98 F (36.7 C) (Oral)  Resp 18  Ht 5' 2.75" (1.594 m)  Wt 247 lb (112.038 kg)  BMI 44.09 kg/m2  SpO2 98%  LMP 07/07/2015 (Exact Date) Constitutional: WN/WD, NAD Heart: RRR Lungs: CTAB Back: No CVA TTP  Abdomen: SOft/NT/ND, NABS  Results for orders placed or performed in visit on 08/31/15  POCT UA - Microscopic Only  Result Value Ref Range   WBC, Ur, HPF, POC 2-4    RBC, urine, microscopic 0-2    Bacteria, U Microscopic 1+    Mucus, UA neg    Epithelial cells, urine per micros 3-6    Crystals, Ur, HPF, POC neg    Casts, Ur, LPF, POC neg    Yeast, UA neg   Urinalysis Dipstick  Result Value Ref Range   Color, UA yellow    Clarity, UA clear    Glucose, UA neg    Bilirubin, UA neg    Ketones, UA neg    Spec Grav, UA >=1.030    Blood, UA neg    pH, UA 5.0    Protein, UA neg    Urobilinogen, UA 0.2  Nitrite, UA neg    Leukocytes, UA Negative Negative    Assessment and Plan:  1. Urinary urgency - POCT UA - Microscopic Only - Urinalysis Dipstick - UA/Micro are both non revealing for UTI.  Will increase fluid intake, ibuprofen and f/u if no improvement in Sx.  Could consider vaginitis but would be highly unlikely Sx cause w/o vaginal d/c.  Cystitis most likely etiology so could consider pyrdidium

## 2015-09-02 NOTE — Progress Notes (Signed)
Patient discussed with  Dr. Awanda Mink. Agree with assessment and plan of care per  his note.   Return to clinic if worsening.

## 2015-10-05 DIAGNOSIS — R7303 Prediabetes: Secondary | ICD-10-CM | POA: Insufficient documentation

## 2016-01-07 ENCOUNTER — Other Ambulatory Visit (HOSPITAL_COMMUNITY)
Admission: RE | Admit: 2016-01-07 | Discharge: 2016-01-07 | Disposition: A | Discharge status: Home / Self Care | Payer: 59 | Source: Ambulatory Visit | Attending: Emergency Medicine | Admitting: Emergency Medicine

## 2016-01-07 ENCOUNTER — Encounter (HOSPITAL_COMMUNITY): Payer: Self-pay | Admitting: Emergency Medicine

## 2016-01-07 ENCOUNTER — Emergency Department (HOSPITAL_COMMUNITY)
Admission: EM | Admit: 2016-01-07 | Discharge: 2016-01-07 | Disposition: A | Discharge status: Home / Self Care | Payer: 59 | Source: Home / Self Care | Attending: Emergency Medicine | Admitting: Emergency Medicine

## 2016-01-07 DIAGNOSIS — J029 Acute pharyngitis, unspecified: Secondary | ICD-10-CM | POA: Diagnosis not present

## 2016-01-07 DIAGNOSIS — J3489 Other specified disorders of nose and nasal sinuses: Secondary | ICD-10-CM | POA: Insufficient documentation

## 2016-01-07 LAB — POCT RAPID STREP A: Streptococcus, Group A Screen (Direct): NEGATIVE

## 2016-01-07 NOTE — ED Provider Notes (Signed)
CSN: [id number]     Arrival date & time 01/07/16  1855 History   First MD Initiated Contact with Patient 01/07/16 1915     Chief Complaint  Patient presents with  . URI   (Consider location/radiation/quality/duration/timing/severity/associated sxs/prior Treatment) HPI Comments: 25 year old obese female complaining of sore throat for one day. Is associated with PND. Denies fever or earache. She states she had mono 2 years ago which caused a sore throat and this sore throat feels like that one.   Past Medical History  Diagnosis Date  . Polycystic ovaries   . Chest pain   . Migraine    No past surgical history on file. Family History  Problem Relation Age of Onset  . Bladder Cancer Mother   . Kidney disease Father   . Heart disease Maternal Grandmother   . Heart disease Paternal Grandmother    Social History  Substance Use Topics  . Smoking status: Never Smoker   . Smokeless tobacco: Never Used  . Alcohol Use: 0.0 oz/week    0 Standard drinks or equivalent per week     Comment: occassional   OB History    No data available     Review of Systems  Constitutional: Negative for fever, activity change and fatigue.  HENT: Positive for postnasal drip, rhinorrhea and sore throat. Negative for congestion.   Eyes: Negative.   Respiratory: Positive for cough. Negative for shortness of breath.   Cardiovascular: Negative for chest pain.  Gastrointestinal: Negative.   Musculoskeletal: Negative.   Neurological: Negative.     Allergies  Citrus  Home Medications   Prior to Admission medications   Medication Sig Start Date End Date Taking? Authorizing Provider  metFORMIN (GLUCOPHAGE) 500 MG tablet  08/19/15  Yes Historical Provider, MD  Norethindrone-Ethinyl Estradiol-Fe (GENERESSE) 0.8-25 MG-MCG tablet Chew 1 tablet by mouth daily. 08/04/14  Yes Historical Provider, MD  sertraline (ZOLOFT) 50 MG tablet Take 50 mg by mouth daily. 07/23/14   Historical Provider, MD   Meds Ordered and  Administered this Visit  Medications - No data to display  BP 130/85 mmHg  Pulse 121  Temp(Src) 98.3 F (36.8 C) (Oral)  SpO2 98%  LMP 12/28/2015 No data found.   Physical Exam  Constitutional: She appears well-developed and well-nourished. No distress.  HENT:  Mouth/Throat: No oropharyngeal exudate.  Bilateral TMs are normal. Oropharynx with minor erythema, cobblestoning and moderate amount of clear PND. No exudates.   Eyes: Conjunctivae and EOM are normal.  Neck: Normal range of motion. Neck supple.  No anterior or posterior adenopathy  Cardiovascular: Normal rate, regular rhythm and normal heart sounds.   Pulmonary/Chest: Effort normal and breath sounds normal. No respiratory distress. She has no wheezes. She has no rales.  Musculoskeletal: Normal range of motion.  Lymphadenopathy:    She has no cervical adenopathy.  Neurological: She is alert. No cranial nerve deficit. She exhibits normal muscle tone.  Skin: Skin is warm and dry.  Psychiatric: She has a normal mood and affect.  Nursing note and vitals reviewed.   ED Course  Procedures (including critical care time)  Labs Review Labs Reviewed  POCT RAPID STREP A   Results for orders placed or performed during the hospital encounter of 01/07/16  POCT rapid strep A [facility])  Result Value Ref Range   Streptococcus, Group A Screen (Direct) NEGATIVE NEGATIVE     Imaging Review No results found.   Visual Acuity Review  Right Eye Distance:   Left Eye Distance:  Bilateral Distance:    Right Eye Near:   Left Eye Near:    Bilateral Near:         MDM   1. Pharyngitis   2. Sinus drainage    Sore Throat Your sore throat is primarily due to the irritating effects of sinus drainage in the back of your throat. Recommend taking an antihistamines such as Zyrtec, Allegra or Claritin to help with drainage. For something stronger you may take Chlor-Trimeton 2-4 mg every 4 hours. This may call some  drowsiness. Cepacol lozenges for sore throat pain. Ibuprofen 600 mg every 6-8 hours as needed for sore throat pain. Drink plenty of fluids and stay well-hydrated    Janne Napoleon, NP 01/07/16 1935

## 2016-01-07 NOTE — Discharge Instructions (Signed)
Sore Throat Your sore throat is primarily due to the irritating effects of sinus drainage in the back of your throat. Recommend taking an antihistamines such as Zyrtec, Allegra or Claritin to help with drainage. For something stronger you may take Chlor-Trimeton 2-4 mg every 4 hours. This may call some drowsiness. Cepacol lozenges for sore throat pain. Ibuprofen 600 mg every 6-8 hours as needed for sore throat pain. Drink plenty of fluids and stay well-hydrated A sore throat is pain, burning, irritation, or scratchiness of the throat. There is often pain or tenderness when swallowing or talking. A sore throat may be accompanied by other symptoms, such as coughing, sneezing, fever, and swollen neck glands. A sore throat is often the first sign of another sickness, such as a cold, flu, strep throat, or mononucleosis (commonly known as mono). Most sore throats go away without medical treatment. CAUSES  The most common causes of a sore throat include:  A viral infection, such as a cold, flu, or mono.  A bacterial infection, such as strep throat, tonsillitis, or whooping cough.  Seasonal allergies.  Dryness in the air.  Irritants, such as smoke or pollution.  Gastroesophageal reflux disease (GERD). HOME CARE INSTRUCTIONS   Only take over-the-counter medicines as directed by your caregiver.  Drink enough fluids to keep your urine clear or pale yellow.  Rest as needed.  Try using throat sprays, lozenges, or sucking on hard candy to ease any pain (if older than 4 years or as directed).  Sip warm liquids, such as broth, herbal tea, or warm water with honey to relieve pain temporarily. You may also eat or drink cold or frozen liquids such as frozen ice pops.  Gargle with salt water (mix 1 tsp salt with 8 oz of water).  Do not smoke and avoid secondhand smoke.  Put a cool-mist humidifier in your bedroom at night to moisten the air. You can also turn on a hot shower and sit in the bathroom  with the door closed for 5-10 minutes. SEEK IMMEDIATE MEDICAL CARE IF:  You have difficulty breathing.  You are unable to swallow fluids, soft foods, or your saliva.  You have increased swelling in the throat.  Your sore throat does not get better in 7 days.  You have nausea and vomiting.  You have a fever or persistent symptoms for more than 2-3 days.  You have a fever and your symptoms suddenly get worse. MAKE SURE YOU:   Understand these instructions.  Will watch your condition.  Will get help right away if you are not doing well or get worse.   This information is not intended to replace advice given to you by your health care provider. Make sure you discuss any questions you have with your health care provider.   Document Released: 01/10/2005 Document Revised: 12/24/2014 Document Reviewed: 08/10/2012 Elsevier Interactive Patient Education 2016 [employer]  Pharyngitis Pharyngitis is a sore throat (pharynx). There is redness, pain, and swelling of your throat. HOME CARE   Drink enough fluids to keep your pee (urine) clear or pale yellow.  Only take medicine as told by your doctor.  You may get sick again if you do not take medicine as told. Finish your medicines, even if you start to feel better.  Do not take aspirin.  Rest.  Rinse your mouth (gargle) with salt water ( tsp of salt per 1 qt of water) every 1-2 hours. This will help the pain.  If you are not at risk for  choking, you can suck on hard candy or sore throat lozenges. GET HELP IF:  You have large, tender lumps on your neck.  You have a rash.  You cough up green, yellow-brown, or bloody spit. GET HELP RIGHT AWAY IF:   You have a stiff neck.  You drool or cannot swallow liquids.  You throw up (vomit) or are not able to keep medicine or liquids down.  You have very bad pain that does not go away with medicine.  You have problems breathing (not from a stuffy nose). MAKE SURE YOU:    Understand these instructions.  Will watch your condition.  Will get help right away if you are not doing well or get worse.   This information is not intended to replace advice given to you by your health care provider. Make sure you discuss any questions you have with your health care provider.   Document Released: 05/21/2008 Document Revised: 09/23/2013 Document Reviewed: 08/10/2013 Elsevier Interactive Patient Education [employer].

## 2016-01-07 NOTE — ED Notes (Signed)
C/o cold sx onset yest associated w/ST, prod cough and BA A&O x4... No acute distress.

## 2016-01-10 ENCOUNTER — Telehealth (HOSPITAL_COMMUNITY): Payer: Self-pay | Admitting: Emergency Medicine

## 2016-01-10 LAB — CULTURE, GROUP A STREP (THRC)

## 2016-01-10 NOTE — ED Notes (Signed)
Pt reports she's not feeling any better... Adv pt to come in for further evaluation  Also adv pt she can continue taking the OTC meds for another 3-5 days and if she's not feeling any better to come in Pt verb understanding.

## 2016-01-10 NOTE — ED Notes (Signed)
Returned pt's call.... LM on VM [phone]... Pt reports on message she's not feeling any better.

## 2016-01-11 ENCOUNTER — Telehealth (HOSPITAL_COMMUNITY): Payer: Self-pay | Admitting: Internal Medicine

## 2016-01-11 DIAGNOSIS — J020 Streptococcal pharyngitis: Secondary | ICD-10-CM

## 2016-01-11 MED ORDER — PENICILLIN V POTASSIUM 500 MG PO TABS: 500.0000 mg | ORAL_TABLET | Freq: Two times a day (BID) | ORAL | Status: DC

## 2016-01-11 NOTE — ED Notes (Signed)
Throat culture positive strep (few colonies).  Given persistent sx's and mild tachycardia at UC visit, have sent rx for penicillin to [facility] on [address].   Please notify patient to pick up rx, and to recheck if not starting to improve within 48 hrs.    Sherlene Shams, MD 01/11/16 [phone]

## 2016-04-18 DIAGNOSIS — Z9884 Bariatric surgery status: Secondary | ICD-10-CM | POA: Insufficient documentation

## 2016-10-01 DIAGNOSIS — Z3685 Encounter for antenatal screening for Streptococcus B: Secondary | ICD-10-CM | POA: Diagnosis not present

## 2016-10-01 DIAGNOSIS — Z3401 Encounter for supervision of normal first pregnancy, first trimester: Secondary | ICD-10-CM | POA: Diagnosis not present

## 2016-10-01 DIAGNOSIS — O26841 Uterine size-date discrepancy, first trimester: Secondary | ICD-10-CM | POA: Diagnosis not present

## 2016-10-01 DIAGNOSIS — Z3A09 9 weeks gestation of pregnancy: Secondary | ICD-10-CM | POA: Diagnosis not present

## 2016-10-01 LAB — OB RESULTS CONSOLE ANTIBODY SCREEN: Antibody Screen: NEGATIVE

## 2016-10-01 LAB — OB RESULTS CONSOLE RPR: RPR: NONREACTIVE

## 2016-10-01 LAB — OB RESULTS CONSOLE ABO/RH: RH Type: POSITIVE

## 2016-10-01 LAB — OB RESULTS CONSOLE RUBELLA ANTIBODY, IGM: RUBELLA: IMMUNE

## 2016-10-01 LAB — OB RESULTS CONSOLE GC/CHLAMYDIA
CHLAMYDIA, DNA PROBE: NEGATIVE
Gonorrhea: NEGATIVE

## 2016-10-01 LAB — OB RESULTS CONSOLE HIV ANTIBODY (ROUTINE TESTING): HIV: NONREACTIVE

## 2016-10-01 LAB — OB RESULTS CONSOLE HEPATITIS B SURFACE ANTIGEN: HEP B S AG: NEGATIVE

## 2016-10-12 DIAGNOSIS — Z3401 Encounter for supervision of normal first pregnancy, first trimester: Secondary | ICD-10-CM | POA: Diagnosis not present

## 2016-10-12 DIAGNOSIS — Z3480 Encounter for supervision of other normal pregnancy, unspecified trimester: Secondary | ICD-10-CM | POA: Diagnosis not present

## 2016-10-12 DIAGNOSIS — Z113 Encounter for screening for infections with a predominantly sexual mode of transmission: Secondary | ICD-10-CM | POA: Diagnosis not present

## 2016-10-23 DIAGNOSIS — Z3682 Encounter for antenatal screening for nuchal translucency: Secondary | ICD-10-CM | POA: Diagnosis not present

## 2016-10-23 DIAGNOSIS — Z36 Encounter for antenatal screening for chromosomal anomalies: Secondary | ICD-10-CM | POA: Diagnosis not present

## 2016-10-23 DIAGNOSIS — Z3491 Encounter for supervision of normal pregnancy, unspecified, first trimester: Secondary | ICD-10-CM | POA: Diagnosis not present

## 2016-10-24 ENCOUNTER — Other Ambulatory Visit: Payer: Self-pay | Admitting: Physician Assistant

## 2016-10-24 ENCOUNTER — Ambulatory Visit
Admission: RE | Admit: 2016-10-24 | Discharge: 2016-10-24 | Disposition: A | Discharge status: Home / Self Care | Payer: 59 | Source: Ambulatory Visit | Attending: Physician Assistant | Admitting: Physician Assistant

## 2016-10-24 DIAGNOSIS — R079 Chest pain, unspecified: Secondary | ICD-10-CM

## 2016-10-24 DIAGNOSIS — R0602 Shortness of breath: Secondary | ICD-10-CM

## 2016-10-24 MED ORDER — IOPAMIDOL (ISOVUE-370) INJECTION 76%
75.0000 mL | Freq: Once | INTRAVENOUS | Status: AC | PRN
  Administered 2016-10-24: 75 mL via INTRAVENOUS

## 2016-11-14 DIAGNOSIS — Z362 Encounter for other antenatal screening follow-up: Secondary | ICD-10-CM | POA: Diagnosis not present

## 2016-11-14 DIAGNOSIS — Z3A16 16 weeks gestation of pregnancy: Secondary | ICD-10-CM | POA: Diagnosis not present

## 2016-12-03 DIAGNOSIS — Z363 Encounter for antenatal screening for malformations: Secondary | ICD-10-CM | POA: Diagnosis not present

## 2016-12-03 DIAGNOSIS — Z3400 Encounter for supervision of normal first pregnancy, unspecified trimester: Secondary | ICD-10-CM | POA: Diagnosis not present

## 2016-12-19 DIAGNOSIS — K912 Postsurgical malabsorption, not elsewhere classified: Secondary | ICD-10-CM | POA: Diagnosis not present

## 2017-01-04 DIAGNOSIS — Z3A23 23 weeks gestation of pregnancy: Secondary | ICD-10-CM | POA: Diagnosis not present

## 2017-01-04 DIAGNOSIS — Z9884 Bariatric surgery status: Secondary | ICD-10-CM | POA: Diagnosis not present

## 2017-01-04 DIAGNOSIS — E282 Polycystic ovarian syndrome: Secondary | ICD-10-CM | POA: Diagnosis not present

## 2017-01-04 DIAGNOSIS — K912 Postsurgical malabsorption, not elsewhere classified: Secondary | ICD-10-CM | POA: Diagnosis not present

## 2017-01-08 DIAGNOSIS — Z3400 Encounter for supervision of normal first pregnancy, unspecified trimester: Secondary | ICD-10-CM | POA: Diagnosis not present

## 2017-01-08 DIAGNOSIS — Z362 Encounter for other antenatal screening follow-up: Secondary | ICD-10-CM | POA: Diagnosis not present

## 2017-01-23 DIAGNOSIS — H52223 Regular astigmatism, bilateral: Secondary | ICD-10-CM | POA: Diagnosis not present

## 2017-02-06 DIAGNOSIS — Z3480 Encounter for supervision of other normal pregnancy, unspecified trimester: Secondary | ICD-10-CM | POA: Diagnosis not present

## 2017-02-06 DIAGNOSIS — Z0142 Encounter for cervical smear to confirm findings of recent normal smear following initial abnormal smear: Secondary | ICD-10-CM | POA: Diagnosis not present

## 2017-02-27 DIAGNOSIS — Z3400 Encounter for supervision of normal first pregnancy, unspecified trimester: Secondary | ICD-10-CM | POA: Diagnosis not present

## 2017-02-27 DIAGNOSIS — Z36 Encounter for antenatal screening for chromosomal anomalies: Secondary | ICD-10-CM | POA: Diagnosis not present

## 2017-03-12 DIAGNOSIS — O368330 Maternal care for abnormalities of the fetal heart rate or rhythm, third trimester, not applicable or unspecified: Secondary | ICD-10-CM | POA: Diagnosis not present

## 2017-03-12 DIAGNOSIS — Z3A32 32 weeks gestation of pregnancy: Secondary | ICD-10-CM | POA: Diagnosis not present

## 2017-03-12 DIAGNOSIS — Z3400 Encounter for supervision of normal first pregnancy, unspecified trimester: Secondary | ICD-10-CM | POA: Diagnosis not present

## 2017-03-27 ENCOUNTER — Ambulatory Visit (INDEPENDENT_AMBULATORY_CARE_PROVIDER_SITE_OTHER): Payer: Self-pay | Admitting: Pediatrics

## 2017-03-27 DIAGNOSIS — Z3490 Encounter for supervision of normal pregnancy, unspecified, unspecified trimester: Secondary | ICD-10-CM

## 2017-03-27 DIAGNOSIS — Z7681 Expectant parent(s) prebirth pediatrician visit: Secondary | ICD-10-CM

## 2017-03-27 NOTE — Progress Notes (Signed)
Prenatal counseling for impending newborn done-- Z31.81 1st chld, 29WTG, no complications, prenatal started at [facility]

## 2017-04-08 DIAGNOSIS — Z36 Encounter for antenatal screening for chromosomal anomalies: Secondary | ICD-10-CM | POA: Diagnosis not present

## 2017-04-08 DIAGNOSIS — Z23 Encounter for immunization: Secondary | ICD-10-CM | POA: Diagnosis not present

## 2017-04-08 DIAGNOSIS — Z3400 Encounter for supervision of normal first pregnancy, unspecified trimester: Secondary | ICD-10-CM | POA: Diagnosis not present

## 2017-04-10 DIAGNOSIS — Z3482 Encounter for supervision of other normal pregnancy, second trimester: Secondary | ICD-10-CM | POA: Diagnosis not present

## 2017-04-10 DIAGNOSIS — Z3483 Encounter for supervision of other normal pregnancy, third trimester: Secondary | ICD-10-CM | POA: Diagnosis not present

## 2017-04-15 DIAGNOSIS — Z36 Encounter for antenatal screening for chromosomal anomalies: Secondary | ICD-10-CM | POA: Diagnosis not present

## 2017-04-15 DIAGNOSIS — Z3400 Encounter for supervision of normal first pregnancy, unspecified trimester: Secondary | ICD-10-CM | POA: Diagnosis not present

## 2017-04-29 ENCOUNTER — Inpatient Hospital Stay (HOSPITAL_COMMUNITY)
Admission: AD | Admit: 2017-04-29 | Discharge: 2017-04-29 | Disposition: A | Discharge status: Home / Self Care | Payer: 59 | Source: Ambulatory Visit | Attending: Obstetrics & Gynecology | Admitting: Obstetrics & Gynecology

## 2017-04-29 ENCOUNTER — Encounter (HOSPITAL_COMMUNITY): Payer: Self-pay

## 2017-04-29 DIAGNOSIS — Z3A00 Weeks of gestation of pregnancy not specified: Secondary | ICD-10-CM | POA: Insufficient documentation

## 2017-04-29 DIAGNOSIS — O479 False labor, unspecified: Secondary | ICD-10-CM | POA: Diagnosis not present

## 2017-04-29 LAB — URINALYSIS, ROUTINE W REFLEX MICROSCOPIC
Bilirubin Urine: NEGATIVE
GLUCOSE, UA: NEGATIVE mg/dL
Hgb urine dipstick: NEGATIVE
KETONES UR: NEGATIVE mg/dL
Nitrite: NEGATIVE
PROTEIN: NEGATIVE mg/dL
Specific Gravity, Urine: 1.01 (ref 1.005–1.030)
Trans Epithel, UA: 2
pH: 6 (ref 5.0–8.0)

## 2017-04-29 LAB — POCT FERN TEST

## 2017-04-29 NOTE — MAU Note (Signed)
Pt. Here for r/o labor.  Contractions started today around lunch time 1 every 10 minutes with labor back pain.  Denies sudden gush of fluid, denies vaginal bleeding. Positive for fetal movement. Headache "all day", having been having headache all week, seeing specs, denies epigastric pain.  No clonus, 2+ reflex. BM today, last sexual encounter was Sunday.

## 2017-04-29 NOTE — MAU Note (Signed)
Pt here with c/o contractions about every 10 minutes. Denies any bleeding, reports some leaking of fluid. Reports positive fetal movement. Having headache all day.

## 2017-04-29 NOTE — MAU Note (Signed)
Late Entry:   I have communicated with Dr. Lynnette Caffey and reviewed vital signs:  Vitals:   04/29/17 2030 04/29/17 2128  BP: 114/79 120/84  Pulse: 83 74  Resp:    Temp:      Vaginal exam:  Dilation: 2 Effacement (%): 50 Exam by:: Myron Stankovich, RN ,   Also reviewed contraction pattern and that non-stress test is reactive.  It has been documented that patient is contracting irregularly.  Pt. declined 2nd vaginal exam to determine cervical change. Pt. Appears to not have signs of active labor.  Information relayed to doctor regarding headache, blood pressure (113/73 - 118/86) one BP of 138/81 on admission. Contractions irregular, no bloody show on admission exam. Maryann Alar completed because pt. States, "I think I have been leaking during the day, occasionally. Fern negative. Patient denies any other complaints.  Based on this report provider has given order for discharge.  A discharge order and diagnosis entered by a provider.   Labor discharge instructions reviewed with patient.

## 2017-05-01 DIAGNOSIS — Z3480 Encounter for supervision of other normal pregnancy, unspecified trimester: Secondary | ICD-10-CM | POA: Diagnosis not present

## 2017-05-06 ENCOUNTER — Encounter (HOSPITAL_COMMUNITY): Payer: Self-pay | Admitting: *Deleted

## 2017-05-06 ENCOUNTER — Telehealth (HOSPITAL_COMMUNITY): Payer: Self-pay | Admitting: *Deleted

## 2017-05-06 LAB — OB RESULTS CONSOLE GBS: GBS: NEGATIVE

## 2017-05-06 NOTE — Telephone Encounter (Signed)
Preadmission screen  

## 2017-05-07 MED ORDER — ZOLPIDEM TARTRATE 5 MG PO TABS
5.0000 mg | ORAL_TABLET | Freq: Every evening | ORAL | Status: DC | PRN
  Filled 2017-05-07: qty 1

## 2017-05-09 ENCOUNTER — Encounter (HOSPITAL_COMMUNITY)
Admission: RE | Disposition: A | Discharge status: Home / Self Care | Payer: Self-pay | Source: Ambulatory Visit | Attending: Obstetrics and Gynecology

## 2017-05-09 ENCOUNTER — Encounter (HOSPITAL_COMMUNITY): Payer: Self-pay

## 2017-05-09 ENCOUNTER — Inpatient Hospital Stay (HOSPITAL_COMMUNITY): Payer: 59 | Admitting: Anesthesiology

## 2017-05-09 ENCOUNTER — Inpatient Hospital Stay (HOSPITAL_COMMUNITY)
Admission: RE | Admit: 2017-05-09 | Discharge: 2017-05-11 | DRG: 766 | Disposition: A | Discharge status: Home / Self Care | Payer: 59 | Source: Ambulatory Visit | Attending: Obstetrics and Gynecology | Admitting: Obstetrics and Gynecology

## 2017-05-09 DIAGNOSIS — O99844 Bariatric surgery status complicating childbirth: Secondary | ICD-10-CM | POA: Diagnosis present

## 2017-05-09 DIAGNOSIS — Z8249 Family history of ischemic heart disease and other diseases of the circulatory system: Secondary | ICD-10-CM | POA: Diagnosis not present

## 2017-05-09 DIAGNOSIS — Z3A00 Weeks of gestation of pregnancy not specified: Secondary | ICD-10-CM | POA: Diagnosis not present

## 2017-05-09 DIAGNOSIS — O480 Post-term pregnancy: Principal | ICD-10-CM | POA: Diagnosis present

## 2017-05-09 DIAGNOSIS — Z3490 Encounter for supervision of normal pregnancy, unspecified, unspecified trimester: Secondary | ICD-10-CM

## 2017-05-09 DIAGNOSIS — Z3A40 40 weeks gestation of pregnancy: Secondary | ICD-10-CM | POA: Diagnosis not present

## 2017-05-09 LAB — CBC
HEMATOCRIT: 38.7 % (ref 36.0–46.0)
Hemoglobin: 13.1 g/dL (ref 12.0–15.0)
MCH: 30.7 pg (ref 26.0–34.0)
MCHC: 33.9 g/dL (ref 30.0–36.0)
MCV: 90.6 fL (ref 78.0–100.0)
Platelets: 169 10*3/uL (ref 150–400)
RBC: 4.27 MIL/uL (ref 3.87–5.11)
RDW: 13.9 % (ref 11.5–15.5)
WBC: 10.2 10*3/uL (ref 4.0–10.5)

## 2017-05-09 LAB — RPR: RPR Ser Ql: NONREACTIVE

## 2017-05-09 LAB — ABO/RH: ABO/RH(D): O POS

## 2017-05-09 LAB — TYPE AND SCREEN
ABO/RH(D): O POS
Antibody Screen: NEGATIVE

## 2017-05-09 SURGERY — Surgical Case
Anesthesia: Epidural

## 2017-05-09 MED ORDER — ACETAMINOPHEN 325 MG PO TABS
650.0000 mg | ORAL_TABLET | ORAL | Status: DC | PRN
Start: 2017-05-09 — End: 2017-05-09

## 2017-05-09 MED ORDER — MEPERIDINE HCL 25 MG/ML IJ SOLN
INTRAMUSCULAR | Status: DC | PRN
  Administered 2017-05-09 (×2): 12.5 mg via INTRAVENOUS

## 2017-05-09 MED ORDER — MEPERIDINE HCL 25 MG/ML IJ SOLN
INTRAMUSCULAR | Status: AC
  Filled 2017-05-09: qty 1

## 2017-05-09 MED ORDER — WITCH HAZEL-GLYCERIN EX PADS
1.0000 | MEDICATED_PAD | CUTANEOUS | Status: DC | PRN
Start: 2017-05-09 — End: 2017-05-11

## 2017-05-09 MED ORDER — IBUPROFEN 600 MG PO TABS
600.0000 mg | ORAL_TABLET | Freq: Four times a day (QID) | ORAL | Status: DC
  Filled 2017-05-09 (×2): qty 1

## 2017-05-09 MED ORDER — KETOROLAC TROMETHAMINE 30 MG/ML IJ SOLN: 30.0000 mg | Freq: Once | INTRAMUSCULAR | Status: DC | PRN

## 2017-05-09 MED ORDER — PRENATAL MULTIVITAMIN CH
1.0000 | ORAL_TABLET | Freq: Every day | ORAL | Status: DC
  Administered 2017-05-10 – 2017-05-11 (×2): 1 via ORAL
  Filled 2017-05-09 (×2): qty 1

## 2017-05-09 MED ORDER — SODIUM CHLORIDE 0.9 % IR SOLN
Status: DC | PRN
  Administered 2017-05-09: 1000 mL

## 2017-05-09 MED ORDER — SOD CITRATE-CITRIC ACID 500-334 MG/5ML PO SOLN
30.0000 mL | ORAL | Status: DC | PRN
  Administered 2017-05-09: 30 mL via ORAL
  Filled 2017-05-09: qty 15

## 2017-05-09 MED ORDER — OXYTOCIN BOLUS FROM INFUSION: 500.0000 mL | Freq: Once | INTRAVENOUS | Status: DC

## 2017-05-09 MED ORDER — HYDROMORPHONE HCL 1 MG/ML IJ SOLN: 0.2500 mg | INTRAMUSCULAR | Status: DC | PRN

## 2017-05-09 MED ORDER — NALOXONE HCL 0.4 MG/ML IJ SOLN: 0.4000 mg | INTRAMUSCULAR | Status: DC | PRN

## 2017-05-09 MED ORDER — METOCLOPRAMIDE HCL 5 MG/ML IJ SOLN
INTRAMUSCULAR | Status: AC
  Filled 2017-05-09: qty 2

## 2017-05-09 MED ORDER — OXYTOCIN 40 UNITS IN LACTATED RINGERS INFUSION - SIMPLE MED: 2.5000 [IU]/h | INTRAVENOUS | Status: AC

## 2017-05-09 MED ORDER — DIPHENHYDRAMINE HCL 50 MG/ML IJ SOLN: 12.5000 mg | INTRAMUSCULAR | Status: DC | PRN

## 2017-05-09 MED ORDER — OXYCODONE HCL 5 MG PO TABS: 10.0000 mg | ORAL_TABLET | ORAL | Status: DC | PRN

## 2017-05-09 MED ORDER — BUTORPHANOL TARTRATE 1 MG/ML IJ SOLN: 1.0000 mg | INTRAMUSCULAR | Status: DC | PRN

## 2017-05-09 MED ORDER — SIMETHICONE 80 MG PO CHEW
80.0000 mg | CHEWABLE_TABLET | ORAL | Status: DC
  Administered 2017-05-10: 80 mg via ORAL
  Filled 2017-05-09: qty 1

## 2017-05-09 MED ORDER — DEXTROSE 5 % IV SOLN
1.0000 ug/kg/h | INTRAVENOUS | Status: DC | PRN
  Filled 2017-05-09: qty 2

## 2017-05-09 MED ORDER — PROMETHAZINE HCL 25 MG/ML IJ SOLN: 6.2500 mg | INTRAMUSCULAR | Status: DC | PRN

## 2017-05-09 MED ORDER — OXYCODONE HCL 5 MG PO TABS: 5.0000 mg | ORAL_TABLET | ORAL | Status: DC | PRN

## 2017-05-09 MED ORDER — OXYCODONE-ACETAMINOPHEN 5-325 MG PO TABS: 1.0000 | ORAL_TABLET | ORAL | Status: DC | PRN

## 2017-05-09 MED ORDER — ACETAMINOPHEN 325 MG PO TABS
650.0000 mg | ORAL_TABLET | ORAL | Status: DC | PRN
  Administered 2017-05-10 – 2017-05-11 (×5): 650 mg via ORAL
  Filled 2017-05-09 (×5): qty 2

## 2017-05-09 MED ORDER — TERBUTALINE SULFATE 1 MG/ML IJ SOLN: 0.2500 mg | Freq: Once | INTRAMUSCULAR | Status: DC | PRN

## 2017-05-09 MED ORDER — NALBUPHINE HCL 10 MG/ML IJ SOLN: 5.0000 mg | Freq: Once | INTRAMUSCULAR | Status: DC | PRN

## 2017-05-09 MED ORDER — LACTATED RINGERS IV SOLN: 500.0000 mL | Freq: Once | INTRAVENOUS | Status: DC

## 2017-05-09 MED ORDER — SIMETHICONE 80 MG PO CHEW: 80.0000 mg | CHEWABLE_TABLET | ORAL | Status: DC | PRN

## 2017-05-09 MED ORDER — SCOPOLAMINE 1 MG/3DAYS TD PT72
MEDICATED_PATCH | TRANSDERMAL | Status: DC | PRN
  Administered 2017-05-09: 1 via TRANSDERMAL

## 2017-05-09 MED ORDER — ONDANSETRON HCL 4 MG/2ML IJ SOLN
4.0000 mg | Freq: Four times a day (QID) | INTRAMUSCULAR | Status: DC | PRN
  Administered 2017-05-09: 4 mg via INTRAVENOUS
  Filled 2017-05-09: qty 2

## 2017-05-09 MED ORDER — EPHEDRINE 5 MG/ML INJ: 10.0000 mg | INTRAVENOUS | Status: DC | PRN

## 2017-05-09 MED ORDER — MORPHINE SULFATE (PF) 0.5 MG/ML IJ SOLN
INTRAMUSCULAR | Status: AC
  Filled 2017-05-09: qty 10

## 2017-05-09 MED ORDER — ONDANSETRON HCL 4 MG/2ML IJ SOLN: 4.0000 mg | Freq: Three times a day (TID) | INTRAMUSCULAR | Status: DC | PRN

## 2017-05-09 MED ORDER — SCOPOLAMINE 1 MG/3DAYS TD PT72
1.0000 | MEDICATED_PATCH | Freq: Once | TRANSDERMAL | Status: DC
  Filled 2017-05-09: qty 1

## 2017-05-09 MED ORDER — LACTATED RINGERS IV SOLN: 500.0000 mL | INTRAVENOUS | Status: DC | PRN

## 2017-05-09 MED ORDER — METOCLOPRAMIDE HCL 5 MG/ML IJ SOLN
INTRAMUSCULAR | Status: DC | PRN
  Administered 2017-05-09: 10 mg via INTRAVENOUS

## 2017-05-09 MED ORDER — FENTANYL 2.5 MCG/ML BUPIVACAINE 1/10 % EPIDURAL INFUSION (WH - ANES)
14.0000 mL/h | INTRAMUSCULAR | Status: DC | PRN
  Administered 2017-05-09: 14 mL/h via EPIDURAL

## 2017-05-09 MED ORDER — PHENYLEPHRINE 40 MCG/ML (10ML) SYRINGE FOR IV PUSH (FOR BLOOD PRESSURE SUPPORT)
PREFILLED_SYRINGE | INTRAVENOUS | Status: AC
  Filled 2017-05-09: qty 10

## 2017-05-09 MED ORDER — SIMETHICONE 80 MG PO CHEW
80.0000 mg | CHEWABLE_TABLET | Freq: Three times a day (TID) | ORAL | Status: DC
  Administered 2017-05-10 – 2017-05-11 (×4): 80 mg via ORAL
  Filled 2017-05-09 (×4): qty 1

## 2017-05-09 MED ORDER — LACTATED RINGERS IV SOLN
INTRAVENOUS | Status: DC
  Administered 2017-05-10: 10:00:00 via INTRAVENOUS

## 2017-05-09 MED ORDER — KETOROLAC TROMETHAMINE 30 MG/ML IJ SOLN
INTRAMUSCULAR | Status: AC
  Administered 2017-05-09: 30 mg via INTRAMUSCULAR
  Filled 2017-05-09: qty 1

## 2017-05-09 MED ORDER — MEASLES, MUMPS & RUBELLA VAC ~~LOC~~ INJ
0.5000 mL | INJECTION | Freq: Once | SUBCUTANEOUS | Status: DC
  Filled 2017-05-09: qty 0.5

## 2017-05-09 MED ORDER — DIPHENHYDRAMINE HCL 25 MG PO CAPS: 25.0000 mg | ORAL_CAPSULE | ORAL | Status: DC | PRN

## 2017-05-09 MED ORDER — KETOROLAC TROMETHAMINE 30 MG/ML IJ SOLN
30.0000 mg | Freq: Four times a day (QID) | INTRAMUSCULAR | Status: AC | PRN
  Administered 2017-05-09: 30 mg via INTRAMUSCULAR

## 2017-05-09 MED ORDER — TETANUS-DIPHTH-ACELL PERTUSSIS 5-2.5-18.5 LF-MCG/0.5 IM SUSP: 0.5000 mL | Freq: Once | INTRAMUSCULAR | Status: DC

## 2017-05-09 MED ORDER — COCONUT OIL OIL: 1.0000 "application " | TOPICAL_OIL | Status: DC | PRN

## 2017-05-09 MED ORDER — DIBUCAINE 1 % RE OINT: 1.0000 "application " | TOPICAL_OINTMENT | RECTAL | Status: DC | PRN

## 2017-05-09 MED ORDER — LACTATED RINGERS IV SOLN
INTRAVENOUS | Status: DC
  Administered 2017-05-09 (×5): via INTRAVENOUS

## 2017-05-09 MED ORDER — PHENYLEPHRINE 40 MCG/ML (10ML) SYRINGE FOR IV PUSH (FOR BLOOD PRESSURE SUPPORT)
80.0000 ug | PREFILLED_SYRINGE | INTRAVENOUS | Status: DC | PRN
  Filled 2017-05-09: qty 10

## 2017-05-09 MED ORDER — NALBUPHINE HCL 10 MG/ML IJ SOLN: 5.0000 mg | INTRAMUSCULAR | Status: DC | PRN

## 2017-05-09 MED ORDER — SCOPOLAMINE 1 MG/3DAYS TD PT72
MEDICATED_PATCH | TRANSDERMAL | Status: AC
  Filled 2017-05-09: qty 1

## 2017-05-09 MED ORDER — PHENYLEPHRINE 40 MCG/ML (10ML) SYRINGE FOR IV PUSH (FOR BLOOD PRESSURE SUPPORT)
80.0000 ug | PREFILLED_SYRINGE | INTRAVENOUS | Status: DC | PRN
Start: 2017-05-09 — End: 2017-05-09

## 2017-05-09 MED ORDER — OXYTOCIN 40 UNITS IN LACTATED RINGERS INFUSION - SIMPLE MED
1.0000 m[IU]/min | INTRAVENOUS | Status: DC
  Filled 2017-05-09: qty 1000

## 2017-05-09 MED ORDER — OXYCODONE-ACETAMINOPHEN 5-325 MG PO TABS: 2.0000 | ORAL_TABLET | ORAL | Status: DC | PRN

## 2017-05-09 MED ORDER — ONDANSETRON HCL 4 MG/2ML IJ SOLN
INTRAMUSCULAR | Status: AC
  Filled 2017-05-09: qty 2

## 2017-05-09 MED ORDER — DEXTROSE 5 % IV SOLN
2.0000 g | Freq: Once | INTRAVENOUS | Status: AC
  Administered 2017-05-09: 2 g via INTRAVENOUS
  Filled 2017-05-09: qty 2

## 2017-05-09 MED ORDER — DEXAMETHASONE SODIUM PHOSPHATE 4 MG/ML IJ SOLN
INTRAMUSCULAR | Status: AC
  Filled 2017-05-09: qty 1

## 2017-05-09 MED ORDER — OXYTOCIN 40 UNITS IN LACTATED RINGERS INFUSION - SIMPLE MED: 2.5000 [IU]/h | INTRAVENOUS | Status: DC

## 2017-05-09 MED ORDER — FENTANYL 2.5 MCG/ML BUPIVACAINE 1/10 % EPIDURAL INFUSION (WH - ANES)
14.0000 mL/h | INTRAMUSCULAR | Status: DC | PRN
  Administered 2017-05-09: 14 mL/h via EPIDURAL
  Filled 2017-05-09 (×2): qty 100

## 2017-05-09 MED ORDER — DIPHENHYDRAMINE HCL 25 MG PO CAPS: 25.0000 mg | ORAL_CAPSULE | Freq: Four times a day (QID) | ORAL | Status: DC | PRN

## 2017-05-09 MED ORDER — MORPHINE SULFATE (PF) 0.5 MG/ML IJ SOLN
INTRAMUSCULAR | Status: DC | PRN
  Administered 2017-05-09: 4 mg via EPIDURAL
  Administered 2017-05-09 (×2): .5 mg via EPIDURAL

## 2017-05-09 MED ORDER — PHENYLEPHRINE 40 MCG/ML (10ML) SYRINGE FOR IV PUSH (FOR BLOOD PRESSURE SUPPORT): 80.0000 ug | PREFILLED_SYRINGE | INTRAVENOUS | Status: DC | PRN

## 2017-05-09 MED ORDER — LACTATED RINGERS IV SOLN
500.0000 mL | Freq: Once | INTRAVENOUS | Status: AC
  Administered 2017-05-09: 500 mL via INTRAVENOUS

## 2017-05-09 MED ORDER — LIDOCAINE HCL (PF) 1 % IJ SOLN: 30.0000 mL | INTRAMUSCULAR | Status: DC | PRN

## 2017-05-09 MED ORDER — LIDOCAINE HCL (PF) 1 % IJ SOLN
INTRAMUSCULAR | Status: DC | PRN
Start: 2017-05-09 — End: 2017-05-09
  Administered 2017-05-09 (×2): 5 mL via EPIDURAL

## 2017-05-09 MED ORDER — ZOLPIDEM TARTRATE 5 MG PO TABS: 5.0000 mg | ORAL_TABLET | Freq: Every evening | ORAL | Status: DC | PRN

## 2017-05-09 MED ORDER — SODIUM CHLORIDE 0.9% FLUSH: 3.0000 mL | INTRAVENOUS | Status: DC | PRN

## 2017-05-09 MED ORDER — OXYTOCIN 10 UNIT/ML IJ SOLN
INTRAMUSCULAR | Status: AC
  Filled 2017-05-09: qty 4

## 2017-05-09 MED ORDER — MEPERIDINE HCL 25 MG/ML IJ SOLN: 6.2500 mg | INTRAMUSCULAR | Status: DC | PRN

## 2017-05-09 MED ORDER — MISOPROSTOL 25 MCG QUARTER TABLET
25.0000 ug | ORAL_TABLET | ORAL | Status: DC | PRN
  Administered 2017-05-09: 25 ug via VAGINAL
  Filled 2017-05-09: qty 1

## 2017-05-09 MED ORDER — MENTHOL 3 MG MT LOZG: 1.0000 | LOZENGE | OROMUCOSAL | Status: DC | PRN

## 2017-05-09 MED ORDER — SENNOSIDES-DOCUSATE SODIUM 8.6-50 MG PO TABS
2.0000 | ORAL_TABLET | ORAL | Status: DC
  Administered 2017-05-10: 2 via ORAL
  Filled 2017-05-09: qty 2

## 2017-05-09 MED ORDER — PHENYLEPHRINE HCL 10 MG/ML IJ SOLN
INTRAMUSCULAR | Status: DC | PRN
  Administered 2017-05-09: 80 ug via INTRAVENOUS
  Administered 2017-05-09: 40 ug via INTRAVENOUS
  Administered 2017-05-09: 80 ug via INTRAVENOUS

## 2017-05-09 MED ORDER — KETOROLAC TROMETHAMINE 30 MG/ML IJ SOLN: 30.0000 mg | Freq: Four times a day (QID) | INTRAMUSCULAR | Status: AC | PRN

## 2017-05-09 SURGICAL SUPPLY — 27 items
CHLORAPREP W/TINT 26ML (MISCELLANEOUS) ×2 IMPLANT
CLAMP CORD UMBIL (MISCELLANEOUS) IMPLANT
CLOTH BEACON ORANGE TIMEOUT ST (SAFETY) ×2 IMPLANT
DRSG OPSITE POSTOP 4X10 (GAUZE/BANDAGES/DRESSINGS) ×2 IMPLANT
ELECT REM PT RETURN 9FT ADLT (ELECTROSURGICAL) ×2
ELECTRODE REM PT RTRN 9FT ADLT (ELECTROSURGICAL) ×1 IMPLANT
EXTRACTOR VACUUM M CUP 4 TUBE (SUCTIONS) IMPLANT
GLOVE BIOGEL PI IND STRL 7.0 (GLOVE) ×1 IMPLANT
GLOVE BIOGEL PI INDICATOR 7.0 (GLOVE) ×1
GLOVE SURG ORTHO 8.0 STRL STRW (GLOVE) ×2 IMPLANT
GOWN STRL REUS W/TWL LRG LVL3 (GOWN DISPOSABLE) ×4 IMPLANT
HOVERMATT SINGLE USE (MISCELLANEOUS) ×2 IMPLANT
KIT ABG SYR 3ML LUER SLIP (SYRINGE) ×2 IMPLANT
NEEDLE HYPO 25X5/8 SAFETYGLIDE (NEEDLE) ×2 IMPLANT
NS IRRIG 1000ML POUR BTL (IV SOLUTION) ×2 IMPLANT
PACK C SECTION WH (CUSTOM PROCEDURE TRAY) ×2 IMPLANT
PAD OB MATERNITY 4.3X12.25 (PERSONAL CARE ITEMS) ×2 IMPLANT
PENCIL SMOKE EVAC W/HOLSTER (ELECTROSURGICAL) ×2 IMPLANT
SUT MNCRL 0 VIOLET CTX 36 (SUTURE) ×3 IMPLANT
SUT MON AB 4-0 PS1 27 (SUTURE) ×2 IMPLANT
SUT MONOCRYL 0 CTX 36 (SUTURE) ×3
SUT PDS AB 1 CT  36 (SUTURE)
SUT PDS AB 1 CT 36 (SUTURE) IMPLANT
SUT VIC AB 1 CTX 36 (SUTURE)
SUT VIC AB 1 CTX36XBRD ANBCTRL (SUTURE) IMPLANT
TOWEL OR 17X24 6PK STRL BLUE (TOWEL DISPOSABLE) ×2 IMPLANT
TRAY FOLEY BAG SILVER LF 14FR (SET/KITS/TRAYS/PACK) ×2 IMPLANT

## 2017-05-09 NOTE — Anesthesia Preprocedure Evaluation (Addendum)
Anesthesia Evaluation  Patient identified by MRN, date of birth, ID band Patient awake    Reviewed: Allergy & Precautions, H&P , NPO status , Patient's Chart, lab work & pertinent test results  Airway Mallampati: II   Neck ROM: full    Dental   Pulmonary neg pulmonary ROS,    breath sounds clear to auscultation       Cardiovascular negative cardio ROS   Rhythm:regular Rate:Normal     Neuro/Psych  Headaches,    GI/Hepatic   Endo/Other    Renal/GU      Musculoskeletal   Abdominal   Peds  Hematology   Anesthesia Other Findings   Reproductive/Obstetrics (+) Pregnancy                             Anesthesia Physical Anesthesia Plan  ASA: I  Anesthesia Plan: Epidural   Post-op Pain Management:    Induction: Intravenous  Airway Management Planned: Natural Airway  Additional Equipment:   Intra-op Plan:   Post-operative Plan:   Informed Consent: I have reviewed the patients History and Physical, chart, labs and discussed the procedure including the risks, benefits and alternatives for the proposed anesthesia with the patient or authorized representative who has indicated his/her understanding and acceptance.     Plan Discussed with: CRNA  Anesthesia Plan Comments:        Anesthesia Quick Evaluation

## 2017-05-09 NOTE — Anesthesia Procedure Notes (Signed)
Epidural Patient location during procedure: OB Start time: 05/09/2017 7:56 AM End time: 05/09/2017 8:05 AM  Staffing Anesthesiologist: Marcie Bal, Doloros Kwolek Performed: anesthesiologist   Preanesthetic Checklist Completed: patient identified, site marked, pre-op evaluation, timeout performed, IV checked, risks and benefits discussed and monitors and equipment checked  Epidural Patient position: sitting Prep: DuraPrep Patient monitoring: heart rate, cardiac monitor, continuous pulse ox and blood pressure Approach: midline Location: L2-L3 Injection technique: LOR saline  Needle:  Needle type: Tuohy  Needle gauge: 17 G Needle length: 9 cm Needle insertion depth: 8 cm Catheter type: closed end flexible Catheter size: 19 Gauge Catheter at skin depth: 13 cm Test dose: negative and Other  Assessment Events: blood not aspirated, injection not painful, no injection resistance and negative IV test  Additional Notes Informed consent obtained prior to proceeding including risk of failure, 1% risk of PDPH, risk of minor discomfort and bruising.  Discussed rare but serious complications including epidural abscess, permanent nerve injury, epidural hematoma.  Discussed alternatives to epidural analgesia and patient desires to proceed.  Timeout performed pre-procedure verifying patient name, procedure, and platelet count.  Patient tolerated procedure well. Reason for block:procedure for pain

## 2017-05-09 NOTE — Progress Notes (Signed)
Patient ID: Krystal Ferguson, female   DOB: [birth year], 26 y.o.   MRN: [id number] No cx change for the last 3 hours and protracted labor before despite adequate labor with IUPC.  Plan Primary C/S for arrest of dilation.  R&B discussed and informed consent obtained.  FHR 140s Cat 1 DL

## 2017-05-09 NOTE — Transfer of Care (Signed)
Immediate Anesthesia Transfer of Care Note  Patient: Krystal Ferguson  Procedure(s) Performed: Procedure(s): CESAREAN SECTION (N/A)  Patient Location: PACU  Anesthesia Type:Epidural  Level of Consciousness: awake  Airway & Oxygen Therapy: Patient Spontanous Breathing  Post-op Assessment: Report given to RN and Post -op Vital signs reviewed and stable  Post vital signs: Reviewed and stable  Last Vitals:  Vitals:   05/09/17 2032 05/09/17 2101  BP: 125/89 132/83  Pulse: (!) 102 (!) 111  Resp: 18 20  Temp:      Last Pain:  Vitals:   05/09/17 2101  TempSrc:   PainSc: 0-No pain         Complications: No apparent anesthesia complications

## 2017-05-09 NOTE — Op Note (Signed)
Cesarean Section Procedure Note  Pre-operative Diagnosis: IUP at 40 5/7, Arrest of dilation  Post-operative Diagnosis: same plus meconium  Surgeon: Luz Lex   Assistants: none  Anesthesia: epidural  Procedure:  Low Segment Transverse cesarean section  Procedure Details  The patient was seen in the Holding Room. The risks, benefits, complications, treatment options, and expected outcomes were discussed with the patient.  The patient concurred with the proposed plan, giving informed consent.  The site of surgery properly noted/marked.. A Time Out was held and the above information confirmed.  After induction of anesthesia, the patient was draped and prepped in the usual sterile manner. A Pfannenstiel incision was made and carried down through the subcutaneous tissue to the fascia. Fascial incision was made and extended transversely. The fascia was separated from the underlying rectus tissue superiorly and inferiorly. The peritoneum was identified and entered. Peritoneal incision was extended longitudinally. The utero-vesical peritoneal reflection was incised transversely and the bladder flap was bluntly freed from the lower uterine segment. A low transverse uterine incision was made. Delivered from vertex presentation was a baby with Apgar scores of 7 at one minute and 9 at five minutes. After the umbilical cord was clamped and cut cord blood was obtained for evaluation. The placenta was removed intact and appeared normal. The uterine outline, tubes and ovaries appeared normal. The uterine incision was closed with running locked sutures of 0 monocryl and imbricated with 0 monocryl. Hemostasis was observed. Lavage was carried out until clear. The peritoneum was then closed with 0 monocryl and rectus muscles plicated in the midline.  After hemostasis was assured, the fascia was then reapproximated with running sutures of 0 PDS. Irrigation was applied and after adequate hemostasis was assured, the  skin was reapproximated with subcutaneous sutures using 4-0 monocryl.  Instrument, sponge, and needle counts were correct prior the abdominal closure and at the conclusion of the case. The patient received 2 grams cefotetan preoperatively.  Findings: Viable female  Estimated Blood Loss:  700cc         Specimens: Placenta was sent to labor and delivery         Complications:  None

## 2017-05-09 NOTE — Anesthesia Pain Management Evaluation Note (Signed)
  CRNA Pain Management Visit Note  Patient: Krystal Ferguson, 26 y.o., female  "Hello I am a member of the anesthesia team at [facility]. We have an anesthesia team available at all times to provide care throughout the hospital, including epidural management and anesthesia for C-section. I don't know your plan for the delivery whether it a natural birth, water birth, IV sedation, nitrous supplementation, doula or epidural, but we want to meet your pain goals."   1.Was your pain managed to your expectations on prior hospitalizations?   No prior hospitalizations  2.What is your expectation for pain management during this hospitalization?     Epidural  3.How can we help you reach that goal? epidural  Record the patient's initial score and the patient's pain goal.   Pain: 0  Pain Goal: 4 [facility] wants you to be able to say your pain was always managed very well.  Willa Rough 05/09/2017

## 2017-05-09 NOTE — H&P (Signed)
Krystal Ferguson is a 26 y.o. female presenting for IOL due to postdates.  Her pregnancy has been uncomplicated.  GBS-.  Hx of gastric sleeve . OB History    Gravida Para Term Preterm AB Living   2       1     SAB TAB Ectopic Multiple Live Births   1             Past Medical History:  Diagnosis Date  . Chest pain   . Migraine   . Polycystic ovaries   . Vaginal Pap smear, abnormal    Past Surgical History:  Procedure Laterality Date  . LAPAROSCOPIC GASTRIC SLEEVE RESECTION     TPN x5 weeks  . WISDOM TOOTH EXTRACTION     Family History: family history includes Bladder Cancer in her mother; Breast cancer in her paternal grandmother; Diabetes in her paternal grandfather; Heart disease in her maternal grandmother and paternal grandfather; Hypertension in her paternal grandmother; Kidney disease in her father; Lung cancer in her paternal grandfather; Thyroid disease in her maternal aunt, maternal grandmother, and mother. Social History:  reports that she has never smoked. She has never used smokeless tobacco. She reports that she drinks alcohol. She reports that she does not use drugs.     Maternal Diabetes: No Genetic Screening: Normal Maternal Ultrasounds/Referrals: Normal Fetal Ultrasounds or other Referrals:  None Maternal Substance Abuse:  No Significant Maternal Medications:  None Significant Maternal Lab Results:  None Other Comments:  None  ROS History Dilation: 3 Effacement (%): 60 Station: -2 Exam by:: Foley,rn Blood pressure 103/67, pulse 67, temperature 97.3 F (36.3 C), temperature source Oral, resp. rate 18, height 5\' 3"  (1.6 m), weight 228 lb (103.4 kg). Exam Physical Exam  Prenatal labs: ABO, Rh: --/--/O POS, O POS (05/24 0250) Antibody: NEG (05/24 0250) Rubella: Immune (10/16 0000) RPR: Nonreactive (10/16 0000)  HBsAg: Negative (10/16 0000)  HIV: Non-reactive (10/16 0000)  GBS: Negative (05/21 0000)   Assessment/Plan: IUP at term, post dates  IOL S/P cytotec.  Plan AROM and pitocin Anticipate SVD   Nishawn Rotan C 05/09/2017, 7:55 AM

## 2017-05-09 NOTE — Progress Notes (Signed)
Dr Corinna Capra at bedside, discussing with patient cesarean section, risks & benefits. Patient agreeable to surgery. Consents signed.

## 2017-05-10 ENCOUNTER — Encounter (HOSPITAL_COMMUNITY): Payer: Self-pay | Admitting: Obstetrics and Gynecology

## 2017-05-10 LAB — CBC
HCT: 35.3 % — ABNORMAL LOW (ref 36.0–46.0)
HEMOGLOBIN: 11.9 g/dL — AB (ref 12.0–15.0)
MCH: 30.7 pg (ref 26.0–34.0)
MCHC: 33.7 g/dL (ref 30.0–36.0)
MCV: 91 fL (ref 78.0–100.0)
PLATELETS: 156 10*3/uL (ref 150–400)
RBC: 3.88 MIL/uL (ref 3.87–5.11)
RDW: 13.6 % (ref 11.5–15.5)
WBC: 19.2 10*3/uL — ABNORMAL HIGH (ref 4.0–10.5)

## 2017-05-10 MED ORDER — LACTATED RINGERS IV BOLUS (SEPSIS)
250.0000 mL | Freq: Once | INTRAVENOUS | Status: AC
  Administered 2017-05-10: 250 mL via INTRAVENOUS

## 2017-05-10 NOTE — Anesthesia Postprocedure Evaluation (Signed)
Anesthesia Post Note  Patient: Krystal Ferguson  Procedure(s) Performed: Procedure(s) (LRB): CESAREAN SECTION (N/A)  Patient location during evaluation: Mother Baby Anesthesia Type: Epidural Level of consciousness: awake and alert and oriented Pain management: pain level controlled Vital Signs Assessment: post-procedure vital signs reviewed and stable Respiratory status: spontaneous breathing and nonlabored ventilation Cardiovascular status: stable Postop Assessment: no headache, no backache, patient able to bend at knees, no signs of nausea or vomiting and adequate PO intake Anesthetic complications: no        Last Vitals:  Vitals:   05/10/17 0350 05/10/17 0700  BP: 119/64   Pulse: 66   Resp: 18 20  Temp: 36.9 C 36.9 C    Last Pain:  Vitals:   05/10/17 0700  TempSrc: Oral  PainSc:    Pain Goal:                 [name]

## 2017-05-10 NOTE — Anesthesia Postprocedure Evaluation (Signed)
Anesthesia Post Note  Patient: Krystal Ferguson  Procedure(s) Performed: Procedure(s) (LRB): CESAREAN SECTION (N/A)  Patient location during evaluation: PACU Anesthesia Type: Epidural Level of consciousness: awake and alert Pain management: pain level controlled Vital Signs Assessment: post-procedure vital signs reviewed and stable Respiratory status: spontaneous breathing Cardiovascular status: stable Postop Assessment: epidural receding Anesthetic complications: no        Last Vitals:  Vitals:   05/10/17 0148 05/10/17 0247  BP: 121/76 113/69  Pulse: 68 70  Resp: 18 20  Temp: 36.7 C 36.6 C    Last Pain:  Vitals:   05/10/17 0350  TempSrc:   PainSc: 0-No pain   Pain Goal:                 Nolon Nations

## 2017-05-10 NOTE — Addendum Note (Signed)
Addendum  created 05/10/17 0840 by Jonna Munro, CRNA   Sign clinical note

## 2017-05-10 NOTE — Progress Notes (Signed)
Subjective: Postpartum Day 1: Cesarean Delivery Patient reports tolerating PO.   Patient reports open lesions of caput of baby Objective: Vital signs in last 24 hours: Temp:  [97.8 F (36.6 C)-99 F (37.2 C)] 98.5 F (36.9 C) (05/25 0700) Pulse Rate:  [66-111] 66 (05/25 0350) Resp:  [13-22] 20 (05/25 0700) BP: (78-144)/(55-110) 119/64 (05/25 0350) SpO2:  [94 %-100 %] 97 % (05/25 0700)  Physical Exam:  General: alert and cooperative Lochia: appropriate Uterine Fundus: firm Incision: healing well DVT Evaluation: No evidence of DVT seen on physical exam. Negative Homan's sign. No cords or calf tenderness. No significant calf/ankle edema. Urine concentrated, patient has had previous gastric sleeve, unable tolerate increased po fluids  Recent Labs  05/09/17 0250 05/10/17 0542  HGB 13.1 11.9*  HCT 38.7 35.3*    Assessment/Plan: Status post Cesarean section. Doing well postoperatively.  Continue current care.  Tamarah Bhullar G 05/10/2017, 8:50 AM

## 2017-05-11 MED ORDER — IBUPROFEN 600 MG PO TABS: 600.0000 mg | ORAL_TABLET | Freq: Four times a day (QID) | ORAL | 0 refills | Status: DC | PRN

## 2017-05-11 MED ORDER — OXYCODONE HCL 5 MG PO TABS: 5.0000 mg | ORAL_TABLET | Freq: Four times a day (QID) | ORAL | 0 refills | Status: DC | PRN

## 2017-05-11 NOTE — Discharge Summary (Signed)
Obstetric Discharge Summary Reason for Admission: onset of labor2 Prenatal Procedures: none Intrapartum Procedures: cesarean: low cervical, transverse Postpartum Procedures: none Complications-Operative and Postpartum: none Hemoglobin  Date Value Ref Range Status  05/10/2017 11.9 (L) 12.0 - 15.0 g/dL Final   HCT  Date Value Ref Range Status  05/10/2017 35.3 (L) 36.0 - 46.0 % Final    Physical Exam:  General: alert, cooperative and no distress Lochia: appropriate Uterine Fundus: firm Incision: healing well DVT Evaluation: No evidence of DVT seen on physical exam.  Discharge Diagnoses: Term Pregnancy-delivered  Discharge Information: Date: 05/11/2017 Activity: pelvic rest Diet: routine Medications: PNV and tylenol, oxycodone Condition: stable Instructions: refer to practice specific booklet Discharge to: home   Newborn Data: Live born female  Birth Weight: 7 lb 10.2 oz (3465 g) APGAR: 5, 9  Home with mother.  Lorin Hauck II,Makylah Bossard E 05/11/2017, 7:47 AM

## 2017-05-11 NOTE — Progress Notes (Signed)
Subjective: Postpartum Day 2: Cesarean Delivery Patient reports tolerating PO, + flatus and no problems voiding.    Objective: Vital signs in last 24 hours: Temp:  [97.7 F (36.5 C)-98.3 F (36.8 C)] 98 F (36.7 C) (05/26 0342) Pulse Rate:  [74-87] 74 (05/26 0342) Resp:  [18] 18 (05/26 0342) BP: (98-113)/(45-67) 113/67 (05/26 0342) SpO2:  [97 %] 97 % (05/25 1957)  Physical Exam:  General: alert, cooperative and no distress Lochia: appropriate Uterine Fundus: firm Incision: healing well DVT Evaluation: No evidence of DVT seen on physical exam.   Recent Labs  05/09/17 0250 05/10/17 0542  HGB 13.1 11.9*  HCT 38.7 35.3*    Assessment/Plan: Status post Cesarean section. Doing well postoperatively.  Discharge home with standard precautions and return to clinic in 4-6 weeks.  Zelina Jimerson II,Chelisa Hennen E 05/11/2017, 7:45 AM

## 2017-05-16 DIAGNOSIS — R399 Unspecified symptoms and signs involving the genitourinary system: Secondary | ICD-10-CM | POA: Diagnosis not present

## 2017-06-10 DIAGNOSIS — Z903 Acquired absence of stomach [part of]: Secondary | ICD-10-CM | POA: Diagnosis not present

## 2017-06-20 DIAGNOSIS — K912 Postsurgical malabsorption, not elsewhere classified: Secondary | ICD-10-CM | POA: Diagnosis not present

## 2017-06-20 DIAGNOSIS — Z903 Acquired absence of stomach [part of]: Secondary | ICD-10-CM | POA: Diagnosis not present

## 2017-06-20 MED FILL — OMEPRAZOLE 20 MG CAPSULE DR: 20 | 30 days supply | Qty: 60 | Fill #0

## 2017-06-24 DIAGNOSIS — Z1389 Encounter for screening for other disorder: Secondary | ICD-10-CM | POA: Diagnosis not present

## 2017-06-24 MED FILL — ALPRAZolam 0.25 MG TABS: 0.25 | 15 days supply | Qty: 30 | Fill #0

## 2017-07-05 DIAGNOSIS — R7303 Prediabetes: Secondary | ICD-10-CM | POA: Diagnosis not present

## 2017-07-05 DIAGNOSIS — Z0000 Encounter for general adult medical examination without abnormal findings: Secondary | ICD-10-CM | POA: Diagnosis not present

## 2017-07-05 DIAGNOSIS — F411 Generalized anxiety disorder: Secondary | ICD-10-CM | POA: Diagnosis not present

## 2017-07-05 DIAGNOSIS — E282 Polycystic ovarian syndrome: Secondary | ICD-10-CM | POA: Diagnosis not present

## 2017-07-05 MED FILL — LARIN FE 1-20 TABLET: 1-20 | 84 days supply | Qty: 84 | Fill #0

## 2017-08-05 MED FILL — OMEPRAZOLE 20 MG CAP: 20 | 30 days supply | Qty: 60 | Fill #1

## 2017-09-20 MED FILL — LARIN FE 1-20 TABLET: 1-20 | 84 days supply | Qty: 84 | Fill #1

## 2017-09-27 DIAGNOSIS — R87612 Low grade squamous intraepithelial lesion on cytologic smear of cervix (LGSIL): Secondary | ICD-10-CM | POA: Diagnosis not present

## 2017-11-10 MED FILL — OMEPRAZOLE 20 MG CAP: 20 | 30 days supply | Qty: 60 | Fill #2

## 2018-01-16 DIAGNOSIS — H10413 Chronic giant papillary conjunctivitis, bilateral: Secondary | ICD-10-CM | POA: Diagnosis not present

## 2018-01-23 MED FILL — OMEPRAZOLE 20 MG CAP: 20 | 30 days supply | Qty: 60 | Fill #3

## 2018-04-10 MED FILL — NORETHIN-ESTRAD-FERR 1-0.02: 1-20 | 84 days supply | Qty: 84 | Fill #2

## 2018-05-21 ENCOUNTER — Other Ambulatory Visit: Payer: Self-pay | Admitting: Gastroenterology

## 2018-05-21 DIAGNOSIS — R1011 Right upper quadrant pain: Secondary | ICD-10-CM | POA: Diagnosis not present

## 2018-05-21 DIAGNOSIS — R112 Nausea with vomiting, unspecified: Secondary | ICD-10-CM

## 2018-05-21 DIAGNOSIS — K5900 Constipation, unspecified: Secondary | ICD-10-CM | POA: Diagnosis not present

## 2018-05-21 DIAGNOSIS — R140 Abdominal distension (gaseous): Secondary | ICD-10-CM | POA: Diagnosis not present

## 2018-05-21 DIAGNOSIS — K219 Gastro-esophageal reflux disease without esophagitis: Secondary | ICD-10-CM | POA: Diagnosis not present

## 2018-05-21 DIAGNOSIS — R194 Change in bowel habit: Secondary | ICD-10-CM | POA: Diagnosis not present

## 2018-05-23 MED FILL — DOXYCYCLINE HYCLATE 100 MG: 100 | 14 days supply | Qty: 28 | Fill #0

## 2018-05-23 MED FILL — ONDANSETRON HCL 4 MG TABLET: 4 | 30 days supply | Qty: 90 | Fill #0

## 2018-06-02 ENCOUNTER — Encounter (HOSPITAL_COMMUNITY): Payer: Self-pay

## 2018-06-02 ENCOUNTER — Ambulatory Visit (HOSPITAL_COMMUNITY)
Admission: RE | Admit: 2018-06-02 | Discharge: 2018-06-02 | Disposition: A | Discharge status: Home / Self Care | Payer: 59 | Source: Ambulatory Visit | Attending: Gastroenterology | Admitting: Gastroenterology

## 2018-06-02 ENCOUNTER — Ambulatory Visit (HOSPITAL_COMMUNITY): Admission: RE | Admit: 2018-06-02 | Payer: 59 | Source: Ambulatory Visit

## 2018-06-25 DIAGNOSIS — K219 Gastro-esophageal reflux disease without esophagitis: Secondary | ICD-10-CM | POA: Diagnosis not present

## 2018-06-25 MED FILL — CARAFATE 1 GM/10 ML SUSP: 1 | 6 days supply | Qty: 180 | Fill #0

## 2018-07-07 ENCOUNTER — Other Ambulatory Visit: Payer: Self-pay | Admitting: General Surgery

## 2018-07-07 DIAGNOSIS — Z9884 Bariatric surgery status: Secondary | ICD-10-CM

## 2018-07-08 DIAGNOSIS — Z6841 Body Mass Index (BMI) 40.0 and over, adult: Secondary | ICD-10-CM | POA: Diagnosis not present

## 2018-07-08 DIAGNOSIS — K912 Postsurgical malabsorption, not elsewhere classified: Secondary | ICD-10-CM | POA: Diagnosis not present

## 2018-07-08 DIAGNOSIS — Z13220 Encounter for screening for lipoid disorders: Secondary | ICD-10-CM | POA: Diagnosis not present

## 2018-07-08 DIAGNOSIS — K219 Gastro-esophageal reflux disease without esophagitis: Secondary | ICD-10-CM | POA: Diagnosis not present

## 2018-07-08 DIAGNOSIS — R7303 Prediabetes: Secondary | ICD-10-CM | POA: Diagnosis not present

## 2018-07-08 DIAGNOSIS — F411 Generalized anxiety disorder: Secondary | ICD-10-CM | POA: Diagnosis not present

## 2018-07-08 DIAGNOSIS — Z0000 Encounter for general adult medical examination without abnormal findings: Secondary | ICD-10-CM | POA: Diagnosis not present

## 2018-07-08 DIAGNOSIS — E282 Polycystic ovarian syndrome: Secondary | ICD-10-CM | POA: Diagnosis not present

## 2018-07-08 MED FILL — ALPRAZolam 0.5 MG TABS: 0.5 | 10 days supply | Qty: 20 | Fill #0

## 2018-07-09 MED FILL — NORETHIN-ESTRAD-FERR 1-0.02: 1-20 | 84 days supply | Qty: 84 | Fill #0

## 2018-07-11 ENCOUNTER — Other Ambulatory Visit: Payer: Self-pay | Admitting: General Surgery

## 2018-07-11 ENCOUNTER — Ambulatory Visit
Admission: RE | Admit: 2018-07-11 | Discharge: 2018-07-11 | Disposition: A | Discharge status: Home / Self Care | Payer: 59 | Source: Ambulatory Visit | Attending: General Surgery | Admitting: General Surgery

## 2018-07-11 DIAGNOSIS — Z9884 Bariatric surgery status: Secondary | ICD-10-CM

## 2018-07-11 DIAGNOSIS — K449 Diaphragmatic hernia without obstruction or gangrene: Secondary | ICD-10-CM | POA: Diagnosis not present

## 2018-08-11 ENCOUNTER — Ambulatory Visit: Payer: Self-pay | Admitting: Skilled Nursing Facility1

## 2018-09-06 ENCOUNTER — Ambulatory Visit (INDEPENDENT_AMBULATORY_CARE_PROVIDER_SITE_OTHER): Payer: Self-pay | Admitting: Family Medicine

## 2018-09-06 VITALS — BP 124/74 | HR 100 | Temp 98.7°F | Resp 18 | Wt 233.4 lb

## 2018-09-06 DIAGNOSIS — L255 Unspecified contact dermatitis due to plants, except food: Secondary | ICD-10-CM

## 2018-09-06 MED ORDER — TRIAMCINOLONE ACETONIDE 0.1 % EX CREA: 1.0000 "application " | TOPICAL_CREAM | Freq: Two times a day (BID) | CUTANEOUS | 0 refills | Status: DC

## 2018-09-06 MED ORDER — PREDNISONE 20 MG PO TABS: 40.0000 mg | ORAL_TABLET | Freq: Every day | ORAL | 0 refills | Status: AC

## 2018-09-06 NOTE — Progress Notes (Signed)
Krystal Ferguson is a 27 y.o. female who presents today with concerns of poison ivy dermatitis due to known contact with plant due to spouse doing yard work. She has been symptomatic for the last 3 days and treated with over the counter topical solutions without relief. She complains of itching and increasing lesions over the past 3 days.  Review of Systems  Constitutional: Negative for chills, fever and malaise/fatigue.  HENT: Negative for congestion, ear discharge, ear pain, sinus pain and sore throat.   Eyes: Negative.   Respiratory: Negative for cough, sputum production and shortness of breath.   Cardiovascular: Negative.  Negative for chest pain.  Gastrointestinal: Negative for abdominal pain, diarrhea, nausea and vomiting.  Genitourinary: Negative for dysuria, frequency, hematuria and urgency.  Musculoskeletal: Negative for myalgias.  Skin: Positive for itching and rash.  Neurological: Negative for headaches.  Endo/Heme/Allergies: Negative.   Psychiatric/Behavioral: Negative.     O: Vitals:   09/06/18 1503  BP: 124/74  Pulse: 100  Resp: 18  Temp: 98.7 F (37.1 C)  SpO2: 98%     Physical Exam  Constitutional: She is oriented to person, place, and time. Vital signs are normal. She appears well-developed and well-nourished. She is active.  Non-toxic appearance. She does not have a sickly appearance.  HENT:  Head: Normocephalic.  Right Ear: Hearing, tympanic membrane, external ear and ear canal normal.  Left Ear: Hearing, tympanic membrane, external ear and ear canal normal.  Nose: Nose normal.  Mouth/Throat: Uvula is midline and oropharynx is clear and moist.  Neck: Normal range of motion. Neck supple.  Cardiovascular: Normal rate, regular rhythm, normal heart sounds and normal pulses.  Pulmonary/Chest: Effort normal and breath sounds normal.  Abdominal: Soft. Bowel sounds are normal.  Musculoskeletal: Normal range of motion.  Lymphadenopathy:       Head (right  side): No submental and no submandibular adenopathy present.       Head (left side): No submental and no submandibular adenopathy present.    She has no cervical adenopathy.  Neurological: She is alert and oriented to person, place, and time.  Skin: Rash noted. Rash is pustular and urticarial. There is erythema.     Consistent with poison ivy dermatitis- all areas erythremic and covered in calamine lotion  Psychiatric: She has a normal mood and affect.  Vitals reviewed.  A: 1. Contact dermatitis due to plants, except food, unspecified contact dermatitis type    P: Discussed exam findings, diagnosis etiology and medication use and indications reviewed with patient. Follow- Up and discharge instructions provided. No emergent/urgent issues found on exam.  Patient verbalized understanding of information provided and agrees with plan of care (POC), all questions answered.  1. Contact dermatitis due to plants, except food, unspecified contact dermatitis type - predniSONE (DELTASONE) 20 MG tablet; Take 2 tablets (40 mg total) by mouth daily with breakfast for 5 days. - triamcinolone cream (KENALOG) 0.1 %; Apply 1 application topically 2 (two) times daily.

## 2018-09-06 NOTE — Patient Instructions (Signed)
Poison Ivy Dermatitis Poison ivy dermatitis is inflammation of the skin that is caused by the allergens on the leaves of the poison ivy plant. The skin reaction often involves redness, swelling, blisters, and extreme itching. What are the causes? This condition is caused by a specific chemical (urushiol) found in the sap of the poison ivy plant. This chemical is sticky and can be easily spread to people, animals, and objects. You can get poison ivy dermatitis by:  Having direct contact with a poison ivy plant.  Touching animals, other people, or objects that have come in contact with poison ivy and have the chemical on them.  What increases the risk? This condition is more likely to develop in:  People who are outdoors often.  People who go outdoors without wearing protective clothing, such as closed shoes, long pants, and a long-sleeved shirt.  What are the signs or symptoms? Symptoms of this condition include:  Redness and itching.  A rash that often includes bumps and blisters. The rash usually appears 48 hours after exposure.  Swelling. This may occur if the reaction is more severe.  Symptoms usually last for 1-2 weeks. However, the first time you develop this condition, symptoms may last 3-4 weeks. How is this diagnosed? This condition may be diagnosed based on your symptoms and a physical exam. Your health care provider may also ask you about any recent outdoor activity. How is this treated? Treatment for this condition will vary depending on how severe it is. Treatment may include:  Hydrocortisone creams or calamine lotions to relieve itching.  Oatmeal baths to soothe the skin.  Over-the-counter antihistamine tablets.  Oral steroid medicine for more severe outbreaks.  Follow these instructions at home:  Take or apply over-the-counter and prescription medicines only as told by your health care provider.  Wash exposed skin as soon as possible with soap and cold  water.  Use hydrocortisone creams or calamine lotion as needed to soothe the skin and relieve itching.  Take oatmeal baths as needed. Use colloidal oatmeal. You can get this at your local pharmacy or grocery store. Follow the instructions on the packaging.  Do not scratch or rub your skin.  While you have the rash, wash clothes right after you wear them. How is this prevented?  Learn to identify the poison ivy plant and avoid contact with the plant. This plant can be recognized by the number of leaves. Generally, poison ivy has three leaves with flowering branches on a single stem. The leaves are typically glossy, and they have jagged edges that come to a point at the front.  If you have been exposed to poison ivy, thoroughly wash with soap and water right away. You have about 30 minutes to remove the plant resin before it will cause the rash. Be sure to wash under your fingernails because any plant resin there will continue to spread the rash.  When hiking or camping, wear clothes that will help you to avoid exposure on the skin. This includes long pants, a long-sleeved shirt, tall socks, and hiking boots. You can also apply preventive lotion to your skin to help limit exposure.  If you suspect that your clothes or outdoor gear came in contact with poison ivy, rinse them off outside with a garden hose before you bring them inside your house. Contact a health care provider if:  You have open sores in the rash area.  You have more redness, swelling, or pain in the affected area.  You have   redness that spreads beyond the rash area.  You have fluid, blood, or pus coming from the affected area.  You have a fever.  You have a rash over a large area of your body.  You have a rash on your eyes, mouth, or genitals.  Your rash does not improve after a few days. Get help right away if:  Your face swells or your eyes swell shut.  You have trouble breathing.  You have trouble  swallowing. This information is not intended to replace advice given to you by your health care provider. Make sure you discuss any questions you have with your health care provider. Document Released: 11/30/2000 Document Revised: 05/10/2016 Document Reviewed: 05/11/2015 Elsevier Interactive Patient Education  2018 [employer]  

## 2018-09-25 ENCOUNTER — Encounter: Payer: 59 | Attending: General Surgery | Admitting: Skilled Nursing Facility1

## 2018-09-25 ENCOUNTER — Encounter: Payer: Self-pay | Admitting: Skilled Nursing Facility1

## 2018-09-25 DIAGNOSIS — Z6841 Body Mass Index (BMI) 40.0 and over, adult: Secondary | ICD-10-CM | POA: Diagnosis not present

## 2018-09-25 DIAGNOSIS — E669 Obesity, unspecified: Secondary | ICD-10-CM | POA: Diagnosis not present

## 2018-09-25 DIAGNOSIS — Z713 Dietary counseling and surveillance: Secondary | ICD-10-CM | POA: Diagnosis not present

## 2018-09-25 NOTE — Patient Instructions (Signed)
-  Try alkaline water pH 8.8  -Take the appropriate multivitamin and 3 calcium a day all taken at least hours a part   -Take some time TODAY to restructure your evening to have time to eat dinner  -Check out EACP  -Try nothing 2 hours before bed   -Chew your food until applesauce consistency

## 2018-09-25 NOTE — Progress Notes (Signed)
Follow-up visit: Post-Operative Sleeve Gastrectomy Surgery  Primary concerns today: Post-operative Bariatric Surgery Nutrition Management. Pt states she has the sleeve in March 2017 and then in June she needed a pic line and TPN for 4 weeks due to emesis. Pt states she got pregnant in August of that year. Pt states her original weight before surgery was 260 pounds then 190 pounds while pregnant. Pt stets while pregnant she had really bad reflux which continued and saw a GI specialist. Pt states she was recently told she has a hernia. Pt states she has been taking reflux medications daily. Pt states she takes linzess 2 times a week with a  Bowel movement sometimes daily sometimes every other day. Pt states she sleeps about 8 hours a night sometimes with reflux in the middle of the night waking feeling rested sometimes. Pt states she has a lot going on and is experiencing stress. Pt states she stops drinking 30 minutes before she eats and 15 minutes after she eats. Pt states she is an emotional eater. Pt states she struggles with taking care of herself and states she has a very busy work schedule.  Pt was tearful throughout the appt and states she has been wanting to speak to a mental health professional but states her husband is not supportive of that.  Pts main goal to work on is eating at least 3 meals a day without skipping.   24-hr recall: 4 days a week 3 meals a day  B (AM): raisin bran or cheerios  Snk (AM):  L (PM): protein bar or salads  Snk (PM):  D (PM): chicken and green beans or peas or mac n cheese or potatoes  Snk (PM):   Fluid intake: water: 70-80 oz Estimated total protein intake: 60+  Medications: see list Supplementation: multivitamin and probiotic   Using straws: no Drinking while eating: no Having you been chewing well: yes Chewing/swallowing difficulties: no Changes in vision: no Changes to mood/headaches: no Hair loss/Cahnges to skin/Changes to nails: on Any  difficulty focusing or concentrating: no Sweating: no Dizziness/Lightheaded: no Palpitations: no  Carbonated beverages: no N/V/D/C/GAS: no Abdominal Pain: no Dumping syndrome: no Recent physical activity:  no  Progress Towards Goal(s):  In progress.    Intervention:  Nutrition counseling. Due to the bodies need for essential vitamins, minerals, and fats the pt was educated on the need to consume a certain amount of calories as well as certain nutrients daily. Pt was educated on the need for daily physical activity and to reach a goal of at least 150 minutes of moderate to vigorous physical activity as directed by their physician due to such benefits as increased musculature and improved lab values.   Goals: -Try alkaline water pH 8.8 -Take the appropriate multivitamin and 3 calcium a day all taken at least hours a part  -Take some time TODAY to restructure your evening to have time to eat dinner -Check out EACP -Try nothing 2 hours before bed  -Chew your food until applesauce consistency   Teaching Method Utilized:  Visual Auditory Hands on  Barriers to learning/adherence to lifestyle change: emotional eating  Demonstrated degree of understanding via:  Teach Back   Monitoring/Evaluation:  Dietary intake, exercise, and body weight.

## 2018-09-27 DIAGNOSIS — H52223 Regular astigmatism, bilateral: Secondary | ICD-10-CM | POA: Diagnosis not present

## 2018-10-15 MED FILL — NORETHIN-ESTRAD-FERR 1-0.02: 1-20 | 84 days supply | Qty: 84 | Fill #1

## 2018-10-20 ENCOUNTER — Encounter: Payer: Self-pay | Admitting: Family Medicine

## 2018-10-20 ENCOUNTER — Ambulatory Visit (INDEPENDENT_AMBULATORY_CARE_PROVIDER_SITE_OTHER): Payer: Self-pay | Admitting: Family Medicine

## 2018-10-20 VITALS — BP 100/72 | HR 72 | Temp 98.4°F | Wt 223.6 lb

## 2018-10-20 DIAGNOSIS — H6993 Unspecified Eustachian tube disorder, bilateral: Secondary | ICD-10-CM

## 2018-10-20 DIAGNOSIS — H6983 Other specified disorders of Eustachian tube, bilateral: Secondary | ICD-10-CM

## 2018-10-20 MED ORDER — PREDNISONE 20 MG PO TABS: 40.0000 mg | ORAL_TABLET | Freq: Every day | ORAL | 0 refills | Status: AC

## 2018-10-20 NOTE — Patient Instructions (Addendum)
Eustachian Tube Dysfunction           1.Zyrtec 10 mg nightly  The eustachian tube connects the middle ear to the back of the nose. It regulates air pressure in the middle ear by allowing air to move between the ear and nose. It also helps to drain fluid from the middle ear space. When the eustachian tube does not function properly, air pressure, fluid, or both can build up in the middle ear. Eustachian tube dysfunction can affect one or both ears. What are the causes? This condition happens when the eustachian tube becomes blocked or cannot open normally. This may result from:  Ear infections.  Colds and other upper respiratory infections.  Allergies.  Irritation, such as from cigarette smoke or acid from the stomach coming up into the esophagus (gastroesophageal reflux).  Sudden changes in air pressure, such as from descending in an airplane.  Abnormal growths in the nose or throat, such as nasal polyps, tumors, or enlarged tissue at the back of the throat (adenoids).  What increases the risk? This condition may be more likely to develop in people who smoke and people who are overweight. Eustachian tube dysfunction may also be more likely to develop in children, especially children who have:  Certain birth defects of the mouth, such as cleft palate.  Large tonsils and adenoids.  What are the signs or symptoms? Symptoms of this condition may include:  A feeling of fullness in the ear.  Ear pain.  Clicking or popping noises in the ear.  Ringing in the ear.  Hearing loss.  Loss of balance.  Symptoms may get worse when the air pressure around you changes, such as when you travel to an area of high elevation or fly on an airplane. How is this diagnosed? This condition may be diagnosed based on:  Your symptoms.  A physical exam of your ear, nose, and throat.  Tests, such as those that measure: ? The movement of your eardrum (tympanogram). ? Your hearing  (audiometry).  How is this treated? Treatment depends on the cause and severity of your condition. If your symptoms are mild, you may be able to relieve your symptoms by moving air into ("popping") your ears. If you have symptoms of fluid in your ears, treatment may include:  Decongestants.  Antihistamines.  Nasal sprays or ear drops that contain medicines that reduce swelling (steroids).  In some cases, you may need to have a procedure to drain the fluid in your eardrum (myringotomy). In this procedure, a small tube is placed in the eardrum to:  Drain the fluid.  Restore the air in the middle ear space.  Follow these instructions at home:  Take over-the-counter and prescription medicines only as told by your health care provider.  Use techniques to help pop your ears as recommended by your health care provider. These may include: ? Chewing gum. ? Yawning. ? Frequent, forceful swallowing. ? Closing your mouth, holding your nose closed, and gently blowing as if you are trying to blow air out of your nose.  Do not do any of the following until your health care provider approves: ? Travel to high altitudes. ? Fly in airplanes. ? Work in a Pension scheme manager or room. ? Scuba dive.  Keep your ears dry. Dry your ears completely after showering or bathing.  Do not smoke.  Keep all follow-up visits as told by your health care provider. This is important. Contact a health care provider if:  Your symptoms do  not go away after treatment.  Your symptoms come back after treatment.  You are unable to pop your ears.  You have: ? A fever. ? Pain in your ear. ? Pain in your head or neck. ? Fluid draining from your ear.  Your hearing suddenly changes.  You become very dizzy.  You lose your balance. This information is not intended to replace advice given to you by your health care provider. Make sure you discuss any questions you have with your health care provider. Document  Released: 12/30/2015 Document Revised: 05/10/2016 Document Reviewed: 12/22/2014 Elsevier Interactive Patient Education  [employer].
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
## 2018-11-03 ENCOUNTER — Ambulatory Visit: Payer: Self-pay | Admitting: Skilled Nursing Facility1

## 2018-11-19 MED FILL — ALPRAZolam 0.5 MG TABS: 0.5 | 10 days supply | Qty: 20 | Fill #0

## 2018-12-25 ENCOUNTER — Ambulatory Visit (INDEPENDENT_AMBULATORY_CARE_PROVIDER_SITE_OTHER): Payer: Self-pay | Admitting: Physician Assistant

## 2018-12-25 VITALS — BP 100/70 | HR 81 | Temp 97.5°F | Resp 16 | Wt 221.8 lb

## 2018-12-25 DIAGNOSIS — B9789 Other viral agents as the cause of diseases classified elsewhere: Secondary | ICD-10-CM

## 2018-12-25 DIAGNOSIS — J069 Acute upper respiratory infection, unspecified: Secondary | ICD-10-CM

## 2018-12-25 MED ORDER — FLUTICASONE PROPIONATE 50 MCG/ACT NA SUSP
2.0000 | Freq: Every day | NASAL | 0 refills | Status: DC
Start: 2018-12-25 — End: 2023-01-22

## 2018-12-25 MED ORDER — PSEUDOEPH-BROMPHEN-DM 30-2-10 MG/5ML PO SYRP
5.0000 mL | ORAL_SOLUTION | Freq: Four times a day (QID) | ORAL | 0 refills | Status: DC | PRN
Start: 2018-12-25 — End: 2022-08-05

## 2018-12-25 MED ORDER — BENZONATATE 100 MG PO CAPS: 100.0000 mg | ORAL_CAPSULE | Freq: Three times a day (TID) | ORAL | 0 refills | Status: DC | PRN

## 2018-12-25 MED FILL — FLUTICASONE PROP 50 MCG SPR: 50 | 30 days supply | Qty: 16 | Fill #0

## 2018-12-25 MED FILL — BROMPHENIR-PSEUDOEPHED-DM S: 30-2-10 | 6 days supply | Qty: 120 | Fill #0

## 2018-12-25 MED FILL — BENZONATATE 100 MG CAPS: 100 | 7 days supply | Qty: 40 | Fill #0

## 2018-12-25 NOTE — Patient Instructions (Signed)
Upper Respiratory Infection, Adult  For cough: Start prescription cough syrup.  May use Tessalon Perles throughout the day to help stop the cough.  For nasal congestion: Use Flonase.  May use nasal saline rinses at nighttime.  Also suggest ginger turmeric herbal tea with honey.  You should feel relief over the next 5 to 7 days.  If your sinus pressure worsens, please contact our office.  If you develop any new ear pain, please come in for evaluation.  If your cough does not improve or you start to have shortness of breath, chest pain, wheezing, or other concerning findings, please seek care immediately at local urgent care/ED.    An upper respiratory infection (URI) affects the nose, throat, and upper air passages. URIs are caused by germs (viruses). The most common type of URI is often called "the common cold." Medicines cannot cure URIs, but you can do things at home to relieve your symptoms. URIs usually get better within 7-10 days. Follow these instructions at home: Activity  Rest as needed.  If you have a fever, stay home from work or school until your fever is gone, or until your doctor says you may return to work or school. ? You should stay home until you cannot spread the infection anymore (you are not contagious). ? Your doctor may have you wear a face mask so you have less risk of spreading the infection. Relieving symptoms  Gargle with a salt-water mixture 3-4 times a day or as needed. To make a salt-water mixture, completely dissolve -1 tsp of salt in 1 cup of warm water.  Use a cool-mist humidifier to add moisture to the air. This can help you breathe more easily. Eating and drinking   Drink enough fluid to keep your pee (urine) pale yellow.  Eat soups and other clear broths. General instructions   Take over-the-counter and prescription medicines only as told by your doctor. These include cold medicines, fever reducers, and cough suppressants.  Do not use any  products that contain nicotine or tobacco. These include cigarettes and e-cigarettes. If you need help quitting, ask your doctor.  Avoid being where people are smoking (avoid secondhand smoke).  Make sure you get regular shots and get the flu shot every year.  Keep all follow-up visits as told by your doctor. This is important. How to avoid spreading infection to others   Wash your hands often with soap and water. If you do not have soap and water, use hand sanitizer.  Avoid touching your mouth, face, eyes, or nose.  Cough or sneeze into a tissue or your sleeve or elbow. Do not cough or sneeze into your hand or into the air. Contact a doctor if:  You are getting worse, not better.  You have any of these: ? A fever. ? Chills. ? Brown or red mucus in your nose. ? Yellow or brown fluid (discharge)coming from your nose. ? Pain in your face, especially when you bend forward. ? Swollen neck glands. ? Pain with swallowing. ? White areas in the back of your throat. Get help right away if:  You have shortness of breath that gets worse.  You have very bad or constant: ? Headache. ? Ear pain. ? Pain in your forehead, behind your eyes, and over your cheekbones (sinus pain). ? Chest pain.  You have long-lasting (chronic) lung disease along with any of these: ? Wheezing. ? Long-lasting cough. ? Coughing up blood. ? A change in your usual mucus.  You  have a stiff neck.  You have changes in your: ? Vision. ? Hearing. ? Thinking. ? Mood. Summary  An upper respiratory infection (URI) is caused by a germ called a virus. The most common type of URI is often called "the common cold."  URIs usually get better within 7-10 days.  Take over-the-counter and prescription medicines only as told by your doctor. This information is not intended to replace advice given to you by your health care provider. Make sure you discuss any questions you have with your health care  provider. Document Released: 05/21/2008 Document Revised: 07/26/2017 Document Reviewed: 07/26/2017 Elsevier Interactive Patient Education  2019 [employer].

## 2018-12-25 NOTE — Progress Notes (Signed)
MRN: [id number] DOB: 10/23/1991  Subjective:   Krystal Ferguson is a 28 y.o. female presenting for chief complaint of scratchy throat, nasal congestion, ear fullness, productive cough, and chest congestion x3 days.  Has associated shortness of breath sensation and chest discomfort when coughing for extended period of time.  Denies fever, chills, chest pain, dyspnea exertion, wheezing, sinus pain, sore throat, difficulty swallowing, myalgias, nausea, vomiting.  Has tried over-the-counter Mucinex D with some relief. Sick contact exposure to people at work and her children. Patient had flu shot this season.  Denies smoking.  PMH of seasonal allergies, not use anything currently.  No PMH of asthma, diabetes, hypertension, or thyroid disorder.  Denies any other aggravating or relieving factors, no other questions or concerns.  Review of Systems  Respiratory: Negative for hemoptysis.   Cardiovascular: Negative for palpitations and leg swelling.  Musculoskeletal: Negative for neck pain.  Skin: Negative for rash.  Neurological: Negative for dizziness and headaches.    Joya has a current medication list which includes the following prescription(s): multiple vitamins-minerals, norgestrel-ethinyl estradiol, pseudoephedrine-guaifenesin, benzonatate, brompheniramine-pseudoephedrine-dm, fluticasone, ibuprofen, oxycodone, prenatal multivitamin, and triamcinolone cream. 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  Bonniejean  has a past medical history of Chest pain, Migraine, Polycystic ovaries, and Vaginal Pap smear, abnormal. Also  has a past surgical history that includes Laparoscopic gastric sleeve resection; Wisdom tooth extraction; and Cesarean section (N/A, 05/09/2017).   Objective:   Vitals: BP 100/70 (BP Location: Right Arm, Patient Position: Sitting, Cuff Size: Normal)   Pulse 81   Temp (!) 97.5 F (36.4 C) (Oral)   Resp 16   Wt 221 lb 12.8 oz (100.6 kg)   LMP 12/09/2018 (Approximate)   SpO2 97%   BMI  39.29 kg/m   Physical Exam Vitals signs reviewed.  Constitutional:      General: She is not in acute distress.    Appearance: She is well-developed. She is not ill-appearing or toxic-appearing.  HENT:     Head: Normocephalic and atraumatic.     Right Ear: Ear canal and external ear normal. A middle ear effusion is present.     Left Ear: Tympanic membrane, ear canal and external ear normal.     Nose: Mucosal edema and congestion (R>L, decreased nasal patency on right side) present.     Right Sinus: No maxillary sinus tenderness or frontal sinus tenderness.     Left Sinus: No maxillary sinus tenderness or frontal sinus tenderness.     Mouth/Throat:     Lips: Pink.     Mouth: Mucous membranes are moist.     Pharynx: Uvula midline. Posterior oropharyngeal erythema present.     Tonsils: No tonsillar exudate or tonsillar abscesses. Swelling: 1+ on the right. 1+ on the left.  Eyes:     Conjunctiva/sclera: Conjunctivae normal.  Neck:     Musculoskeletal: Normal range of motion.  Cardiovascular:     Rate and Rhythm: Normal rate and regular rhythm.     Heart sounds: Normal heart sounds.  Pulmonary:     Effort: Pulmonary effort is normal.     Breath sounds: Normal breath sounds. No decreased breath sounds, wheezing, rhonchi or rales.  Lymphadenopathy:     Head:     Right side of head: No submental, submandibular, tonsillar, preauricular, posterior auricular or occipital adenopathy.     Left side of head: No submental, submandibular, tonsillar, preauricular, posterior auricular or occipital adenopathy.     Cervical: No cervical adenopathy.     Upper Body:  Right upper body: No supraclavicular adenopathy.     Left upper body: No supraclavicular adenopathy.  Skin:    General: Skin is warm and dry.  Neurological:     Mental Status: She is alert.     No results found for this or any previous visit (from the past 24 hour(s)).  Assessment and Plan :  1. Viral URI with cough Patient  is overall well-appearing, no acute distress.  VSS.  Lungs CTAB.  This is consistent with a viral URI with cough. No red flags noted on exam.  Recommend symptomatic treatment at this time.  Advised to contact our office if sinus pressure does not improve over the next 5 to 7 days, could consider Rx for antibiotic at that time.  Otherwise, if any of her current symptoms worsen or she develops new concerning symptoms, seek care at family doctor, urgent care, or ED. - Pseudoephedrine-guaiFENesin ([place] D PO); Take by mouth. - brompheniramine-pseudoephedrine-DM 30-2-10 MG/5ML syrup; Take 5 mLs by mouth 4 (four) times daily as needed.  Dispense: 120 mL; Refill: 0 - benzonatate (TESSALON) 100 MG capsule; Take 1-2 capsules (100-200 mg total) by mouth 3 (three) times daily as needed for cough.  Dispense: 40 capsule; Refill: 0 - fluticasone (FLONASE) 50 MCG/ACT nasal spray; Place 2 sprays into both nostrils daily.  Dispense: 16 g; Refill: 0   Tenna Delaine, [facility] 12/25/2018 1:34 PM

## 2019-01-02 DIAGNOSIS — R21 Rash and other nonspecific skin eruption: Secondary | ICD-10-CM | POA: Diagnosis not present

## 2019-01-02 DIAGNOSIS — J019 Acute sinusitis, unspecified: Secondary | ICD-10-CM | POA: Diagnosis not present

## 2019-01-02 DIAGNOSIS — Z6841 Body Mass Index (BMI) 40.0 and over, adult: Secondary | ICD-10-CM | POA: Diagnosis not present

## 2019-01-02 MED FILL — PROMETHAZINE W/COD SYRUP: 6.25-10 | 6 days supply | Qty: 120 | Fill #0

## 2019-01-02 MED FILL — AMOX-CLAV 875-125 MG TABLET: 875-125 | 10 days supply | Qty: 20 | Fill #0

## 2019-02-19 MED FILL — NORETHIN-ESTRAD-FERR 1-0.02: 1-20 | 84 days supply | Qty: 84 | Fill #2

## 2019-04-21 DIAGNOSIS — M79602 Pain in left arm: Secondary | ICD-10-CM | POA: Diagnosis not present

## 2019-05-07 DIAGNOSIS — Z6841 Body Mass Index (BMI) 40.0 and over, adult: Secondary | ICD-10-CM | POA: Diagnosis not present

## 2019-05-07 DIAGNOSIS — R87612 Low grade squamous intraepithelial lesion on cytologic smear of cervix (LGSIL): Secondary | ICD-10-CM | POA: Diagnosis not present

## 2019-05-07 DIAGNOSIS — Z01419 Encounter for gynecological examination (general) (routine) without abnormal findings: Secondary | ICD-10-CM | POA: Diagnosis not present

## 2019-05-07 MED FILL — BLISOVI FE 1/20 1-20 MG-MCG: 1-20 | 84 days supply | Qty: 84 | Fill #0

## 2019-05-19 DIAGNOSIS — S139XXA Sprain of joints and ligaments of unspecified parts of neck, initial encounter: Secondary | ICD-10-CM | POA: Diagnosis not present

## 2019-05-19 DIAGNOSIS — M545 Low back pain: Secondary | ICD-10-CM | POA: Diagnosis not present

## 2019-06-11 DIAGNOSIS — N870 Mild cervical dysplasia: Secondary | ICD-10-CM | POA: Diagnosis not present

## 2019-06-11 DIAGNOSIS — R87612 Low grade squamous intraepithelial lesion on cytologic smear of cervix (LGSIL): Secondary | ICD-10-CM | POA: Diagnosis not present

## 2019-06-11 DIAGNOSIS — Z3202 Encounter for pregnancy test, result negative: Secondary | ICD-10-CM | POA: Diagnosis not present

## 2019-06-11 DIAGNOSIS — N72 Inflammatory disease of cervix uteri: Secondary | ICD-10-CM | POA: Diagnosis not present

## 2019-07-08 DIAGNOSIS — Z9884 Bariatric surgery status: Secondary | ICD-10-CM | POA: Diagnosis not present

## 2019-07-08 DIAGNOSIS — K219 Gastro-esophageal reflux disease without esophagitis: Secondary | ICD-10-CM | POA: Diagnosis not present

## 2019-07-08 MED FILL — SUCRALFATE 1 GM/10ML SUSP: 1 | 5 days supply | Qty: 180 | Fill #0

## 2019-07-08 MED FILL — PHENTERMINE 37.5 MG TABLET: 37.5 | 30 days supply | Qty: 30 | Fill #0

## 2019-07-15 DIAGNOSIS — R7303 Prediabetes: Secondary | ICD-10-CM | POA: Diagnosis not present

## 2019-07-15 DIAGNOSIS — Z13220 Encounter for screening for lipoid disorders: Secondary | ICD-10-CM | POA: Diagnosis not present

## 2019-07-17 DIAGNOSIS — R7303 Prediabetes: Secondary | ICD-10-CM | POA: Diagnosis not present

## 2019-07-17 DIAGNOSIS — K219 Gastro-esophageal reflux disease without esophagitis: Secondary | ICD-10-CM | POA: Diagnosis not present

## 2019-07-17 DIAGNOSIS — F411 Generalized anxiety disorder: Secondary | ICD-10-CM | POA: Diagnosis not present

## 2019-07-17 DIAGNOSIS — Z7189 Other specified counseling: Secondary | ICD-10-CM | POA: Diagnosis not present

## 2019-07-17 DIAGNOSIS — Z0000 Encounter for general adult medical examination without abnormal findings: Secondary | ICD-10-CM | POA: Diagnosis not present

## 2019-07-17 DIAGNOSIS — E282 Polycystic ovarian syndrome: Secondary | ICD-10-CM | POA: Diagnosis not present

## 2019-07-17 DIAGNOSIS — Z6841 Body Mass Index (BMI) 40.0 and over, adult: Secondary | ICD-10-CM | POA: Diagnosis not present

## 2019-07-31 MED FILL — PANTOPRAZOLE SOD DR 40 MG T: 40 | 30 days supply | Qty: 30 | Fill #0

## 2019-08-04 DIAGNOSIS — J358 Other chronic diseases of tonsils and adenoids: Secondary | ICD-10-CM | POA: Diagnosis not present

## 2019-08-06 MED FILL — PHENTERMINE 37.5 MG TABLET: 37.5 | 30 days supply | Qty: 30 | Fill #0

## 2019-09-08 MED FILL — PHENTERMINE 37.5 MG TABLET: 37.5 | 30 days supply | Qty: 30 | Fill #0

## 2019-09-24 MED FILL — BLISOVI FE 1/20 1-20 MG-MCG: 1-20 | 84 days supply | Qty: 84 | Fill #0

## 2019-10-06 MED FILL — PHENTERMINE 37.5 MG TABLET: 37.5 | 30 days supply | Qty: 30 | Fill #0

## 2019-10-08 DIAGNOSIS — L720 Epidermal cyst: Secondary | ICD-10-CM | POA: Diagnosis not present

## 2019-10-08 DIAGNOSIS — D485 Neoplasm of uncertain behavior of skin: Secondary | ICD-10-CM | POA: Diagnosis not present

## 2019-10-08 DIAGNOSIS — L708 Other acne: Secondary | ICD-10-CM | POA: Diagnosis not present

## 2019-10-15 DIAGNOSIS — Z20828 Contact with and (suspected) exposure to other viral communicable diseases: Secondary | ICD-10-CM | POA: Diagnosis not present

## 2019-11-07 DIAGNOSIS — H52223 Regular astigmatism, bilateral: Secondary | ICD-10-CM | POA: Diagnosis not present

## 2019-11-09 DIAGNOSIS — F411 Generalized anxiety disorder: Secondary | ICD-10-CM | POA: Diagnosis not present

## 2019-11-09 DIAGNOSIS — G43909 Migraine, unspecified, not intractable, without status migrainosus: Secondary | ICD-10-CM | POA: Diagnosis not present

## 2019-11-09 DIAGNOSIS — Z7189 Other specified counseling: Secondary | ICD-10-CM | POA: Diagnosis not present

## 2019-11-09 MED FILL — ALPRAZolam 0.5 MG TABS: 0.5 | 15 days supply | Qty: 30 | Fill #0

## 2019-11-16 MED FILL — PHENTERMINE 37.5 MG TABLET: 37.5 | 30 days supply | Qty: 30 | Fill #0

## 2019-12-23 MED FILL — PHENTERMINE 37.5 MG TABLET: 37.5 | 30 days supply | Qty: 30 | Fill #0

## 2019-12-30 DIAGNOSIS — L700 Acne vulgaris: Secondary | ICD-10-CM | POA: Diagnosis not present

## 2019-12-30 MED FILL — TRETINOIN 0.025% CREAM: 0.025 | 30 days supply | Qty: 20 | Fill #0

## 2020-01-05 MED FILL — NORGESTIM-ETH ESTRAD TRIPHA: 0.18/0.215/ | 56 days supply | Qty: 56 | Fill #0

## 2020-01-09 DIAGNOSIS — Z20828 Contact with and (suspected) exposure to other viral communicable diseases: Secondary | ICD-10-CM | POA: Diagnosis not present

## 2020-02-15 MED FILL — PHENTERMINE 37.5 MG TABLET: 37.5 | 30 days supply | Qty: 30 | Fill #0

## 2020-03-23 DIAGNOSIS — K219 Gastro-esophageal reflux disease without esophagitis: Secondary | ICD-10-CM | POA: Diagnosis not present

## 2020-03-23 DIAGNOSIS — E669 Obesity, unspecified: Secondary | ICD-10-CM | POA: Diagnosis not present

## 2020-03-23 DIAGNOSIS — Z9884 Bariatric surgery status: Secondary | ICD-10-CM | POA: Diagnosis not present

## 2020-03-23 MED FILL — PHENTERMINE 37.5 MG TABLET: 37.5 | 30 days supply | Qty: 30 | Fill #0

## 2020-04-29 ENCOUNTER — Ambulatory Visit: Payer: 59 | Admitting: Podiatry

## 2020-04-29 ENCOUNTER — Other Ambulatory Visit: Payer: Self-pay

## 2020-04-29 DIAGNOSIS — B070 Plantar wart: Secondary | ICD-10-CM

## 2020-04-29 DIAGNOSIS — M79671 Pain in right foot: Secondary | ICD-10-CM

## 2020-04-29 NOTE — Progress Notes (Signed)
  Subjective:  Patient ID: Krystal Ferguson, female    DOB: 1991/03/02,  MRN: [id number]  Chief Complaint  Patient presents with  . Plantar Warts    "I think I have plantar warts" Right foot 1st digit and sub right 1st plantar. Pt states 2 month duration, painful "Feels like walking on rocks".    29 y.o. female presents with the above complaint. History confirmed with patient.   Objective:  Physical Exam: warm, good capillary refill, no trophic changes or ulcerative lesions, normal DP and PT pulses and normal sensory exam. Left Foot: normal exam, no swelling, tenderness, instability; ligaments intact, full range of motion of all ankle/foot joints  Right Foot: multiple verruca right foot.     Assessment:   1. Verruca plantaris   2. Right foot pain      Plan:  Patient was evaluated and treated and all questions answered.  Verruca right foot -Educated on etiology -Lesion debrided and destroyed  Procedure: Destruction of Lesion Location: right foot Anesthesia: none Instrumentation: 15 blade, YAG laser Technique: Debridement of lesion , followed by several passes of Yag laser to destroy lesion.  F/u 2 weeks for check / repeat laser treatment.

## 2020-04-29 NOTE — Progress Notes (Signed)
Patient presents today for laser treatment for plantar warts on the right foot. There are multiple lesions.  She saw Dr. March Rummage as a new patient today.  All other systems are negative.  Lesions were debrided superficially. Laser therapy was administered to the right foot. The patient tolerated the treatment well. All safety precautions were in place.   Follow up in 2 weeks for laser # 2.

## 2020-05-13 MED FILL — PHENTERMINE HCL 37.5 MG CAP: 37.5 | 30 days supply | Qty: 30 | Fill #0

## 2020-05-20 ENCOUNTER — Ambulatory Visit (INDEPENDENT_AMBULATORY_CARE_PROVIDER_SITE_OTHER): Payer: 59 | Admitting: *Deleted

## 2020-05-20 ENCOUNTER — Other Ambulatory Visit: Payer: Self-pay

## 2020-05-20 ENCOUNTER — Encounter: Payer: Self-pay | Admitting: Podiatry

## 2020-05-20 DIAGNOSIS — B070 Plantar wart: Secondary | ICD-10-CM

## 2020-05-20 NOTE — Progress Notes (Signed)
Patient presents today for the 2nd laser treatment for plantar warts on the right foot. There are multiple lesions.  The areas are so much improved after just one treatment.     Dr. March Rummage patient.  All other systems are negative.  Lesions were debrided superficially. Laser therapy was administered to the right foot. The patient tolerated the treatment well. All safety precautions were in place.   Follow up in 2 weeks for laser # 3.

## 2020-05-20 NOTE — Telephone Encounter (Signed)
Left voicemail for patient to call back and r/s laser appt per pts mychart request/message. Next available is July 2nd.

## 2020-06-03 ENCOUNTER — Other Ambulatory Visit: Payer: 59

## 2020-06-07 DIAGNOSIS — Z3040 Encounter for surveillance of contraceptives, unspecified: Secondary | ICD-10-CM | POA: Diagnosis not present

## 2020-06-07 DIAGNOSIS — Z01419 Encounter for gynecological examination (general) (routine) without abnormal findings: Secondary | ICD-10-CM | POA: Diagnosis not present

## 2020-06-07 DIAGNOSIS — Z6835 Body mass index (BMI) 35.0-35.9, adult: Secondary | ICD-10-CM | POA: Diagnosis not present

## 2020-06-07 DIAGNOSIS — R87612 Low grade squamous intraepithelial lesion on cytologic smear of cervix (LGSIL): Secondary | ICD-10-CM | POA: Diagnosis not present

## 2020-06-07 MED FILL — TRI FEMYNOR 28 TABLET: 0.18/0.215/ | 84 days supply | Qty: 84 | Fill #0

## 2020-06-17 ENCOUNTER — Ambulatory Visit: Payer: 59 | Admitting: *Deleted

## 2020-06-17 ENCOUNTER — Other Ambulatory Visit: Payer: Self-pay

## 2020-06-17 DIAGNOSIS — B070 Plantar wart: Secondary | ICD-10-CM

## 2020-06-17 DIAGNOSIS — B351 Tinea unguium: Secondary | ICD-10-CM

## 2020-06-17 NOTE — Progress Notes (Signed)
Patient presents today for the 2nd laser treatment for plantar warts on the right foot. There are multiple lesions.  Much improved today, only one small spot remaining.   Dr. March Rummage patient.  All other systems are negative.  Lesions were debrided superficially. Laser therapy was administered to the right foot. The patient tolerated the treatment well. All safety precautions were in place.   Follow up in 2 weeks for laser # 4.   ~Final pic next visit~

## 2020-06-30 DIAGNOSIS — Z9884 Bariatric surgery status: Secondary | ICD-10-CM | POA: Diagnosis not present

## 2020-06-30 DIAGNOSIS — R1012 Left upper quadrant pain: Secondary | ICD-10-CM | POA: Diagnosis not present

## 2020-06-30 DIAGNOSIS — E669 Obesity, unspecified: Secondary | ICD-10-CM | POA: Diagnosis not present

## 2020-06-30 DIAGNOSIS — K219 Gastro-esophageal reflux disease without esophagitis: Secondary | ICD-10-CM | POA: Diagnosis not present

## 2020-06-30 MED FILL — PHENTERMINE 37.5 MG CAPSULE: 37.5 | 30 days supply | Qty: 30 | Fill #0

## 2020-07-01 ENCOUNTER — Ambulatory Visit: Payer: 59 | Admitting: Podiatry

## 2020-07-01 ENCOUNTER — Other Ambulatory Visit: Payer: Self-pay

## 2020-07-01 DIAGNOSIS — B070 Plantar wart: Secondary | ICD-10-CM

## 2020-07-01 NOTE — Progress Notes (Signed)
  Subjective:  Patient ID: Krystal Ferguson, female    DOB: 07-Mar-1991,  MRN: [id number]  Chief Complaint  Patient presents with  . Procedure    Patient presents for laser wart treatment #4 today, but area has cleared completely - will get Dr. March Rummage to check it today    29 y.o. female presents with the above complaint. History confirmed with patient.   Objective:  Physical Exam: warm, good capillary refill, no trophic changes or ulcerative lesions, normal DP and PT pulses and normal sensory exam. Left Foot: normal exam, no swelling, tenderness, instability; ligaments intact, full range of motion of all ankle/foot joints  Right Foot: No evident verruca    Assessment:   1. Verruca plantaris    Plan:  Patient was evaluated and treated and all questions answered.  Verruca right foot -Resolved. No need for further laser therapy. -F/u PRN.

## 2020-07-05 ENCOUNTER — Other Ambulatory Visit: Payer: Self-pay | Admitting: General Surgery

## 2020-07-05 DIAGNOSIS — R1012 Left upper quadrant pain: Secondary | ICD-10-CM

## 2020-07-15 ENCOUNTER — Ambulatory Visit
Admission: RE | Admit: 2020-07-15 | Discharge: 2020-07-15 | Disposition: A | Discharge status: Home / Self Care | Payer: 59 | Source: Ambulatory Visit | Attending: General Surgery | Admitting: General Surgery

## 2020-07-15 ENCOUNTER — Other Ambulatory Visit: Payer: Self-pay

## 2020-07-15 DIAGNOSIS — R1012 Left upper quadrant pain: Secondary | ICD-10-CM | POA: Diagnosis not present

## 2020-07-15 MED ORDER — IOPAMIDOL (ISOVUE-300) INJECTION 61%
100.0000 mL | Freq: Once | INTRAVENOUS | Status: AC | PRN
  Administered 2020-07-15: 100 mL via INTRAVENOUS

## 2020-08-08 DIAGNOSIS — L281 Prurigo nodularis: Secondary | ICD-10-CM | POA: Diagnosis not present

## 2020-08-09 ENCOUNTER — Other Ambulatory Visit (HOSPITAL_COMMUNITY): Payer: Self-pay | Admitting: Family Medicine

## 2020-08-09 DIAGNOSIS — E669 Obesity, unspecified: Secondary | ICD-10-CM | POA: Diagnosis not present

## 2020-08-09 DIAGNOSIS — F411 Generalized anxiety disorder: Secondary | ICD-10-CM | POA: Diagnosis not present

## 2020-08-09 DIAGNOSIS — Z13220 Encounter for screening for lipoid disorders: Secondary | ICD-10-CM | POA: Diagnosis not present

## 2020-08-09 DIAGNOSIS — K219 Gastro-esophageal reflux disease without esophagitis: Secondary | ICD-10-CM | POA: Diagnosis not present

## 2020-08-09 DIAGNOSIS — R7303 Prediabetes: Secondary | ICD-10-CM | POA: Diagnosis not present

## 2020-08-09 DIAGNOSIS — Z23 Encounter for immunization: Secondary | ICD-10-CM | POA: Diagnosis not present

## 2020-08-09 DIAGNOSIS — Z9884 Bariatric surgery status: Secondary | ICD-10-CM | POA: Diagnosis not present

## 2020-08-09 DIAGNOSIS — L700 Acne vulgaris: Secondary | ICD-10-CM | POA: Diagnosis not present

## 2020-08-09 DIAGNOSIS — E282 Polycystic ovarian syndrome: Secondary | ICD-10-CM | POA: Diagnosis not present

## 2020-08-09 DIAGNOSIS — Z0000 Encounter for general adult medical examination without abnormal findings: Secondary | ICD-10-CM | POA: Diagnosis not present

## 2020-08-09 MED FILL — CLINDAMYCIN PHOS-BENZOYL PE: 1-5 | 30 days supply | Qty: 25 | Fill #0

## 2020-08-10 MED FILL — PHENTERMINE 37.5 MG CAPSULE: 37.5 | 25 days supply | Qty: 25 | Fill #0

## 2020-09-02 DIAGNOSIS — Z9884 Bariatric surgery status: Secondary | ICD-10-CM | POA: Diagnosis not present

## 2020-09-02 DIAGNOSIS — E669 Obesity, unspecified: Secondary | ICD-10-CM | POA: Diagnosis not present

## 2020-09-02 DIAGNOSIS — R1012 Left upper quadrant pain: Secondary | ICD-10-CM | POA: Diagnosis not present

## 2020-09-02 DIAGNOSIS — D509 Iron deficiency anemia, unspecified: Secondary | ICD-10-CM | POA: Diagnosis not present

## 2020-09-05 MED FILL — PHENTERMINE 37.5 MG CAPSULE: 37.5 | 30 days supply | Qty: 30 | Fill #0

## 2020-09-12 DIAGNOSIS — D649 Anemia, unspecified: Secondary | ICD-10-CM | POA: Diagnosis not present

## 2020-09-29 ENCOUNTER — Other Ambulatory Visit (HOSPITAL_COMMUNITY): Payer: Self-pay | Admitting: *Deleted

## 2020-09-29 NOTE — Discharge Instructions (Signed)

## 2020-09-30 ENCOUNTER — Encounter (HOSPITAL_COMMUNITY)
Admission: RE | Admit: 2020-09-30 | Discharge: 2020-09-30 | Disposition: A | Discharge status: Home / Self Care | Payer: 59 | Source: Ambulatory Visit | Attending: Family Medicine | Admitting: Family Medicine

## 2020-09-30 DIAGNOSIS — D509 Iron deficiency anemia, unspecified: Secondary | ICD-10-CM | POA: Diagnosis not present

## 2020-09-30 MED ORDER — SODIUM CHLORIDE 0.9 % IV SOLN
510.0000 mg | INTRAVENOUS | Status: DC
  Administered 2020-09-30: 510 mg via INTRAVENOUS
  Filled 2020-09-30: qty 17

## 2020-10-07 ENCOUNTER — Encounter (HOSPITAL_COMMUNITY)
Admission: RE | Admit: 2020-10-07 | Discharge: 2020-10-07 | Disposition: A | Discharge status: Home / Self Care | Payer: 59 | Source: Ambulatory Visit | Attending: Family Medicine | Admitting: Family Medicine

## 2020-10-07 DIAGNOSIS — D509 Iron deficiency anemia, unspecified: Secondary | ICD-10-CM | POA: Diagnosis not present

## 2020-10-07 MED ORDER — SODIUM CHLORIDE 0.9 % IV SOLN
510.0000 mg | INTRAVENOUS | Status: DC
  Administered 2020-10-07: 510 mg via INTRAVENOUS
  Filled 2020-10-07: qty 510

## 2020-10-19 ENCOUNTER — Other Ambulatory Visit (HOSPITAL_COMMUNITY): Payer: Self-pay | Admitting: General Surgery

## 2020-10-19 MED FILL — PHENTERMINE 37.5 MG CAPSULE: 37.5 | 30 days supply | Qty: 30 | Fill #0

## 2020-11-08 DIAGNOSIS — H52223 Regular astigmatism, bilateral: Secondary | ICD-10-CM | POA: Diagnosis not present

## 2020-12-07 ENCOUNTER — Other Ambulatory Visit (HOSPITAL_COMMUNITY): Payer: Self-pay | Admitting: General Surgery

## 2020-12-07 MED FILL — PHENTERMINE 37.5 MG CAPSULE: 37.5 | 30 days supply | Qty: 30 | Fill #0

## 2020-12-21 DIAGNOSIS — Z9884 Bariatric surgery status: Secondary | ICD-10-CM | POA: Diagnosis not present

## 2020-12-21 DIAGNOSIS — E669 Obesity, unspecified: Secondary | ICD-10-CM | POA: Diagnosis not present

## 2021-01-03 ENCOUNTER — Ambulatory Visit: Payer: 59 | Admitting: Orthopaedic Surgery

## 2021-01-03 ENCOUNTER — Ambulatory Visit (INDEPENDENT_AMBULATORY_CARE_PROVIDER_SITE_OTHER): Payer: 59

## 2021-01-03 ENCOUNTER — Encounter: Payer: Self-pay | Admitting: Orthopaedic Surgery

## 2021-01-03 ENCOUNTER — Other Ambulatory Visit: Payer: Self-pay

## 2021-01-03 DIAGNOSIS — M25571 Pain in right ankle and joints of right foot: Secondary | ICD-10-CM | POA: Diagnosis not present

## 2021-01-03 NOTE — Progress Notes (Signed)
Office Visit Note   Patient: Krystal Ferguson           Date of Birth: June 27, 1991           MRN: [id number] Visit Date: 01/03/2021              Requested by: Mayra Neer, MD [address] [place],  [place] [address] PCP: Mayra Neer, MD   Assessment & Plan: Visit Diagnoses:  1. Pain in joint involving right ankle and foot   2. Pain in right ankle and joints of right foot     Plan: Mild sprain right ankle after a fall on the ice this morning very little swelling.  Has been using an Ace bandage which seems to make a big difference as well as a hightop boot.  We will continue with the same and follow-up as needed.  X-rays were negative.  Does not need any ambulatory aid  Follow-Up Instructions: Return if symptoms worsen or fail to improve.   Orders:  Orders Placed This Encounter  Procedures  . XR Ankle Complete Right   No orders of the defined types were placed in this encounter.     Procedures: No procedures performed   Clinical Data: No additional findings.   Subjective: Chief Complaint  Patient presents with  . Right Ankle - Pain, Injury  Slipped and fell on the ice this morning with what appears to be a plantarflexion injury to the anterior right ankle.  Has been wearing an Ace bandage and a hightop boot and feeling comfortable.  Very little swelling.  No prior problems with her ankle  HPI  Review of Systems   Objective: Vital Signs: There were no vitals taken for this visit.  Physical Exam Constitutional:      Appearance: She is well-developed and well-nourished.  HENT:     Mouth/Throat:     Mouth: Oropharynx is clear and moist.  Eyes:     Extraocular Movements: EOM normal.     Pupils: Pupils are equal, round, and reactive to light.  Pulmonary:     Effort: Pulmonary effort is normal.  Skin:    General: Skin is warm and dry.  Neurological:     Mental Status: She is alert and oriented to person, place, and time.   Psychiatric:        Mood and Affect: Mood and affect normal.        Behavior: Behavior normal.     Ortho Exam awake alert and oriented x3.  Comfortable sitting.  Minimal limp with ambulation referable to her right ankle.  Right ankle skin intact.  No obvious soft tissue swelling.  No pain over either malleolus.  Little bit of pain along the anterior ankle joint.  Neurologically intact.  No pain behind either malleolar I.  No evidence of instability.  Very minimal discomfort over the anterior talofibular ligament  Specialty Comments:  No specialty comments available.  Imaging: XR Ankle Complete Right  Result Date: 01/03/2021 Films of the right ankle obtained in several projections.  No evidence of a fracture.  Ankle mortise intact.  Very little soft tissue edema.  No degenerative changes.    PMFS History: Patient Active Problem List   Diagnosis Date Noted  . Pain in right ankle and joints of right foot 01/03/2021  . Pregnancy 05/09/2017  . Cesarean delivery delivered 05/09/2017  . [redacted] weeks gestation of pregnancy 01/04/2017  . 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 Morbid obesity ([place]) 03/12/2016  .  Prediabetes 10/05/2015  . Morbid obesity with BMI of 40.0-44.9, adult ([place]) 10/05/2015  . Weakness 04/06/2014  . Left-sided weakness 04/06/2014  . Migraine headache 04/06/2014  . Hypokalemia 04/06/2014  . Polycystic ovaries   . Chest pain    Past Medical History:  Diagnosis Date  . Chest pain   . Migraine   . Polycystic ovaries   . Vaginal Pap smear, abnormal     Family History  Problem Relation Age of Onset  . Bladder Cancer Mother   . Thyroid disease Mother   . Kidney disease Father   . Heart disease Maternal Grandmother   . Thyroid disease Maternal Grandmother   . Hypertension Paternal Grandmother   . Breast cancer Paternal Grandmother   . Thyroid disease Maternal Aunt   . Heart disease Paternal Grandfather   . Diabetes Paternal Grandfather   . Lung  cancer Paternal Grandfather     Past Surgical History:  Procedure Laterality Date  . CESAREAN SECTION N/A 05/09/2017   Procedure: CESAREAN SECTION;  Surgeon: Louretta Shorten, MD;  Location: [place];  Service: Obstetrics;  Laterality: N/A;  . LAPAROSCOPIC GASTRIC SLEEVE RESECTION     TPN x5 weeks  . WISDOM TOOTH EXTRACTION     Social History   Occupational History  . Not on file  Tobacco Use  . Smoking status: Never Smoker  . Smokeless tobacco: Never Used  Substance and Sexual Activity  . Alcohol use: Yes    Alcohol/week: 0.0 standard drinks    Comment: occassional  . Drug use: No  . Sexual activity: Yes    Birth control/protection: Pill     Garald Balding, MD   Note - This record has been created using [employer].  Chart creation errors have been sought, but may not always  have been located. Such creation errors do not reflect on  the standard of medical care.

## 2021-01-05 MED FILL — CLINDAMYCIN PHOS-BENZOYL PE: 1-5 | 30 days supply | Qty: 25 | Fill #1

## 2021-01-18 ENCOUNTER — Other Ambulatory Visit (HOSPITAL_COMMUNITY): Payer: Self-pay | Admitting: Student

## 2021-01-18 MED FILL — PHENTERMINE 37.5 MG CAPSULE: 37.5 | 30 days supply | Qty: 30 | Fill #0

## 2021-01-30 ENCOUNTER — Other Ambulatory Visit: Payer: Self-pay

## 2021-01-30 ENCOUNTER — Other Ambulatory Visit (HOSPITAL_COMMUNITY): Payer: Self-pay | Admitting: Family Medicine

## 2021-01-30 ENCOUNTER — Ambulatory Visit (HOSPITAL_COMMUNITY)
Admission: RE | Admit: 2021-01-30 | Discharge: 2021-01-30 | Disposition: A | Discharge status: Home / Self Care | Payer: 59 | Source: Ambulatory Visit | Attending: Family Medicine | Admitting: Family Medicine

## 2021-01-30 DIAGNOSIS — M79662 Pain in left lower leg: Secondary | ICD-10-CM | POA: Insufficient documentation

## 2021-01-30 DIAGNOSIS — M79669 Pain in unspecified lower leg: Secondary | ICD-10-CM | POA: Diagnosis not present

## 2021-02-17 DIAGNOSIS — E669 Obesity, unspecified: Secondary | ICD-10-CM | POA: Diagnosis not present

## 2021-02-17 DIAGNOSIS — Z9884 Bariatric surgery status: Secondary | ICD-10-CM | POA: Diagnosis not present

## 2021-03-15 ENCOUNTER — Other Ambulatory Visit (HOSPITAL_COMMUNITY): Payer: Self-pay | Admitting: General Surgery

## 2021-03-15 MED FILL — PHENTERMINE 37.5 MG TABLET: 37.5 | 30 days supply | Qty: 30 | Fill #0

## 2021-04-03 ENCOUNTER — Other Ambulatory Visit (HOSPITAL_COMMUNITY): Payer: Self-pay

## 2021-04-03 MED FILL — Semaglutide (Weight Mngmt) Soln Auto-Injector 0.25 MG/0.5ML: SUBCUTANEOUS | 30 days supply | Qty: 2 | Fill #0 | Status: CN

## 2021-04-05 ENCOUNTER — Other Ambulatory Visit (HOSPITAL_COMMUNITY): Payer: Self-pay

## 2021-04-11 ENCOUNTER — Other Ambulatory Visit (HOSPITAL_COMMUNITY): Payer: Self-pay

## 2021-04-11 DIAGNOSIS — L259 Unspecified contact dermatitis, unspecified cause: Secondary | ICD-10-CM | POA: Diagnosis not present

## 2021-04-11 MED ORDER — TRIAMCINOLONE ACETONIDE 0.5 % EX CREA
1.0000 "application " | TOPICAL_CREAM | Freq: Two times a day (BID) | CUTANEOUS | 0 refills | Status: DC | PRN
  Filled 2021-04-11: qty 30, 15d supply, fill #0

## 2021-04-20 ENCOUNTER — Other Ambulatory Visit (HOSPITAL_COMMUNITY): Payer: Self-pay

## 2021-04-20 DIAGNOSIS — Z9884 Bariatric surgery status: Secondary | ICD-10-CM | POA: Diagnosis not present

## 2021-04-20 DIAGNOSIS — E669 Obesity, unspecified: Secondary | ICD-10-CM | POA: Diagnosis not present

## 2021-04-20 DIAGNOSIS — K219 Gastro-esophageal reflux disease without esophagitis: Secondary | ICD-10-CM | POA: Diagnosis not present

## 2021-04-20 MED ORDER — WEGOVY 0.25 MG/0.5ML ~~LOC~~ SOAJ
0.2500 mg | SUBCUTANEOUS | 0 refills | Status: DC
  Filled 2021-04-20: qty 2, 28d supply, fill #0

## 2021-05-10 ENCOUNTER — Other Ambulatory Visit (HOSPITAL_COMMUNITY): Payer: Self-pay

## 2021-05-10 MED ORDER — PHENTERMINE HCL 37.5 MG PO CAPS
37.5000 mg | ORAL_CAPSULE | Freq: Every morning | ORAL | 0 refills | Status: DC
  Filled 2021-05-10: qty 30, 30d supply, fill #0

## 2021-05-12 ENCOUNTER — Other Ambulatory Visit (HOSPITAL_COMMUNITY): Payer: Self-pay

## 2021-05-12 MED ORDER — CARESTART COVID-19 HOME TEST VI KIT
PACK | 0 refills | Status: DC
  Filled 2021-05-12: qty 4, 4d supply, fill #0

## 2021-05-16 ENCOUNTER — Other Ambulatory Visit (HOSPITAL_COMMUNITY): Payer: Self-pay

## 2021-06-02 ENCOUNTER — Other Ambulatory Visit (HOSPITAL_COMMUNITY): Payer: Self-pay

## 2021-06-02 DIAGNOSIS — R7303 Prediabetes: Secondary | ICD-10-CM | POA: Diagnosis not present

## 2021-06-02 DIAGNOSIS — E669 Obesity, unspecified: Secondary | ICD-10-CM | POA: Diagnosis not present

## 2021-06-02 DIAGNOSIS — Z9884 Bariatric surgery status: Secondary | ICD-10-CM | POA: Diagnosis not present

## 2021-06-02 MED ORDER — WEGOVY 0.25 MG/0.5ML ~~LOC~~ SOAJ: 0.2500 mg | SUBCUTANEOUS | 0 refills | Status: DC

## 2021-06-05 ENCOUNTER — Other Ambulatory Visit (HOSPITAL_COMMUNITY): Payer: Self-pay

## 2021-06-06 ENCOUNTER — Other Ambulatory Visit (HOSPITAL_COMMUNITY): Payer: Self-pay

## 2021-06-07 ENCOUNTER — Other Ambulatory Visit (HOSPITAL_COMMUNITY): Payer: Self-pay

## 2021-06-13 ENCOUNTER — Other Ambulatory Visit (HOSPITAL_COMMUNITY): Payer: Self-pay

## 2021-06-14 ENCOUNTER — Other Ambulatory Visit (HOSPITAL_COMMUNITY): Payer: Self-pay

## 2021-06-15 ENCOUNTER — Other Ambulatory Visit (HOSPITAL_COMMUNITY): Payer: Self-pay

## 2021-06-15 MED ORDER — OZEMPIC (0.25 OR 0.5 MG/DOSE) 2 MG/1.5ML ~~LOC~~ SOPN
0.2500 mg | PEN_INJECTOR | SUBCUTANEOUS | 0 refills | Status: DC
  Filled 2021-06-15: qty 1.5, 56d supply, fill #0

## 2021-06-26 ENCOUNTER — Other Ambulatory Visit (HOSPITAL_COMMUNITY): Payer: Self-pay

## 2021-06-26 DIAGNOSIS — Z01419 Encounter for gynecological examination (general) (routine) without abnormal findings: Secondary | ICD-10-CM | POA: Diagnosis not present

## 2021-06-26 DIAGNOSIS — Z3040 Encounter for surveillance of contraceptives, unspecified: Secondary | ICD-10-CM | POA: Diagnosis not present

## 2021-06-26 DIAGNOSIS — Z6837 Body mass index (BMI) 37.0-37.9, adult: Secondary | ICD-10-CM | POA: Diagnosis not present

## 2021-06-26 MED ORDER — NORGESTIM-ETH ESTRAD TRIPHASIC 0.18/0.215/0.25 MG-35 MCG PO TABS
1.0000 | ORAL_TABLET | Freq: Every day | ORAL | 4 refills | Status: DC
  Filled 2021-06-26: qty 84, 84d supply, fill #0

## 2021-07-12 DIAGNOSIS — H10413 Chronic giant papillary conjunctivitis, bilateral: Secondary | ICD-10-CM | POA: Diagnosis not present

## 2021-07-12 DIAGNOSIS — H43393 Other vitreous opacities, bilateral: Secondary | ICD-10-CM | POA: Diagnosis not present

## 2021-07-17 ENCOUNTER — Other Ambulatory Visit (HOSPITAL_COMMUNITY): Payer: Self-pay

## 2021-07-17 MED ORDER — OZEMPIC (0.25 OR 0.5 MG/DOSE) 2 MG/1.5ML ~~LOC~~ SOPN
0.2500 mg | PEN_INJECTOR | SUBCUTANEOUS | 0 refills | Status: DC
  Filled 2021-07-17: qty 0.75, 28d supply, fill #0

## 2021-07-18 ENCOUNTER — Other Ambulatory Visit (HOSPITAL_COMMUNITY): Payer: Self-pay

## 2021-07-18 MED ORDER — OZEMPIC (0.25 OR 0.5 MG/DOSE) 2 MG/1.5ML ~~LOC~~ SOPN
0.5000 mg | PEN_INJECTOR | SUBCUTANEOUS | 0 refills | Status: DC
  Filled 2021-07-18 (×2): qty 1.5, 28d supply, fill #0

## 2021-07-27 DIAGNOSIS — E6609 Other obesity due to excess calories: Secondary | ICD-10-CM | POA: Diagnosis not present

## 2021-07-27 DIAGNOSIS — R7303 Prediabetes: Secondary | ICD-10-CM | POA: Diagnosis not present

## 2021-07-27 DIAGNOSIS — Z6835 Body mass index (BMI) 35.0-35.9, adult: Secondary | ICD-10-CM | POA: Diagnosis not present

## 2021-08-09 ENCOUNTER — Other Ambulatory Visit (HOSPITAL_COMMUNITY): Payer: Self-pay

## 2021-08-09 MED ORDER — OZEMPIC (1 MG/DOSE) 4 MG/3ML ~~LOC~~ SOPN
1.0000 mg | PEN_INJECTOR | SUBCUTANEOUS | 0 refills | Status: DC
  Filled 2021-08-09: qty 3, 28d supply, fill #0

## 2021-08-11 ENCOUNTER — Other Ambulatory Visit (HOSPITAL_COMMUNITY): Payer: Self-pay

## 2021-08-11 DIAGNOSIS — Z0000 Encounter for general adult medical examination without abnormal findings: Secondary | ICD-10-CM | POA: Diagnosis not present

## 2021-08-11 DIAGNOSIS — Z9884 Bariatric surgery status: Secondary | ICD-10-CM | POA: Diagnosis not present

## 2021-08-11 DIAGNOSIS — L700 Acne vulgaris: Secondary | ICD-10-CM | POA: Diagnosis not present

## 2021-08-11 DIAGNOSIS — F411 Generalized anxiety disorder: Secondary | ICD-10-CM | POA: Diagnosis not present

## 2021-08-11 DIAGNOSIS — D509 Iron deficiency anemia, unspecified: Secondary | ICD-10-CM | POA: Diagnosis not present

## 2021-08-11 DIAGNOSIS — E669 Obesity, unspecified: Secondary | ICD-10-CM | POA: Diagnosis not present

## 2021-08-11 DIAGNOSIS — R7303 Prediabetes: Secondary | ICD-10-CM | POA: Diagnosis not present

## 2021-08-11 DIAGNOSIS — E282 Polycystic ovarian syndrome: Secondary | ICD-10-CM | POA: Diagnosis not present

## 2021-08-11 DIAGNOSIS — K219 Gastro-esophageal reflux disease without esophagitis: Secondary | ICD-10-CM | POA: Diagnosis not present

## 2021-08-11 MED ORDER — ALPRAZOLAM 0.5 MG PO TABS
0.5000 mg | ORAL_TABLET | Freq: Two times a day (BID) | ORAL | 0 refills | Status: DC | PRN
  Filled 2021-08-11: qty 30, 15d supply, fill #0

## 2021-08-16 ENCOUNTER — Other Ambulatory Visit (HOSPITAL_COMMUNITY): Payer: Self-pay

## 2021-08-16 MED ORDER — QUICKVUE AT-HOME COVID-19 TEST VI KIT
PACK | 0 refills | Status: DC
  Filled 2021-08-16: qty 2, 2d supply, fill #0

## 2021-09-06 ENCOUNTER — Emergency Department (HOSPITAL_COMMUNITY): Payer: 59

## 2021-09-06 ENCOUNTER — Other Ambulatory Visit: Payer: Self-pay

## 2021-09-06 ENCOUNTER — Emergency Department (HOSPITAL_COMMUNITY)
Admission: EM | Admit: 2021-09-06 | Discharge: 2021-09-07 | Disposition: A | Discharge status: Home / Self Care | Payer: 59 | Attending: Emergency Medicine | Admitting: Emergency Medicine

## 2021-09-06 ENCOUNTER — Encounter (HOSPITAL_COMMUNITY): Payer: Self-pay | Admitting: *Deleted

## 2021-09-06 DIAGNOSIS — R0602 Shortness of breath: Secondary | ICD-10-CM | POA: Diagnosis not present

## 2021-09-06 DIAGNOSIS — R0789 Other chest pain: Secondary | ICD-10-CM | POA: Diagnosis not present

## 2021-09-06 DIAGNOSIS — R519 Headache, unspecified: Secondary | ICD-10-CM | POA: Diagnosis not present

## 2021-09-06 DIAGNOSIS — R072 Precordial pain: Secondary | ICD-10-CM | POA: Diagnosis not present

## 2021-09-06 DIAGNOSIS — N9489 Other specified conditions associated with female genital organs and menstrual cycle: Secondary | ICD-10-CM | POA: Insufficient documentation

## 2021-09-06 DIAGNOSIS — R079 Chest pain, unspecified: Secondary | ICD-10-CM | POA: Diagnosis not present

## 2021-09-06 LAB — CBC WITH DIFFERENTIAL/PLATELET
Abs Immature Granulocytes: 0.01 10*3/uL (ref 0.00–0.07)
Basophils Absolute: 0.1 10*3/uL (ref 0.0–0.1)
Basophils Relative: 1 %
Eosinophils Absolute: 0.3 10*3/uL (ref 0.0–0.5)
Eosinophils Relative: 4 %
HCT: 39 % (ref 36.0–46.0)
Hemoglobin: 12.3 g/dL (ref 12.0–15.0)
Immature Granulocytes: 0 %
Lymphocytes Relative: 33 %
Lymphs Abs: 2.5 10*3/uL (ref 0.7–4.0)
MCH: 29.6 pg (ref 26.0–34.0)
MCHC: 31.5 g/dL (ref 30.0–36.0)
MCV: 93.8 fL (ref 80.0–100.0)
Monocytes Absolute: 0.4 10*3/uL (ref 0.1–1.0)
Monocytes Relative: 6 %
Neutro Abs: 4.3 10*3/uL (ref 1.7–7.7)
Neutrophils Relative %: 56 %
Platelets: 204 10*3/uL (ref 150–400)
RBC: 4.16 MIL/uL (ref 3.87–5.11)
RDW: 12.4 % (ref 11.5–15.5)
WBC: 7.5 10*3/uL (ref 4.0–10.5)
nRBC: 0 % (ref 0.0–0.2)

## 2021-09-06 LAB — BASIC METABOLIC PANEL
Anion gap: 8 (ref 5–15)
BUN: 8 mg/dL (ref 6–20)
CO2: 26 mmol/L (ref 22–32)
Calcium: 9.3 mg/dL (ref 8.9–10.3)
Chloride: 105 mmol/L (ref 98–111)
Creatinine, Ser: 0.78 mg/dL (ref 0.44–1.00)
GFR, Estimated: >60 mL/min (ref >60)
Glucose, Bld: 86 mg/dL (ref 70–99)
Potassium: 3.6 mmol/L (ref 3.5–5.1)
Sodium: 139 mmol/L (ref 135–145)

## 2021-09-06 LAB — TROPONIN I (HIGH SENSITIVITY)
Troponin I (High Sensitivity): <2 ng/L (ref <18)
Troponin I (High Sensitivity): <2 ng/L (ref <18)

## 2021-09-06 LAB — I-STAT BETA HCG BLOOD, ED (MC, WL, AP ONLY): I-stat hCG, quantitative: <5 m[IU]/mL (ref <5)

## 2021-09-06 NOTE — ED Provider Notes (Signed)
Emergency Medicine Provider Triage Evaluation Note  Krystal Ferguson , a 30 y.o. female  was evaluated in triage.  Pt complains of headache and chest pain.  Headache has been present x1 week.  Pain is located to left temple.  Onset was gradual and pain progressively worse over time.  No aggravating factors.  Denies any facial asymmetry, visual disturbance, numbness, weakness, or slurred speech.  Chest pain has been intermittent since this weekend.  Midsternal.  Described as a pressure.  Endorses associated shortness of breath.  Denies any nausea, vomiting, diaphoresis, leg swelling or tenderness, palpitations, hemoptysis.  Review of Systems  Positive: Headache, chest pain Negative: acial asymmetry, visual disturbance, numbness, weakness, slurred speech, nausea, vomiting, diaphoresis, leg swelling or tenderness, palpitations, hemoptysis.  Physical Exam  BP 137/80 (BP Location: Right Arm)   Pulse 80   Temp 97.9 F (36.6 C) (Oral)   Resp 16   Ht 5\' 3"  (1.6 m)   Wt 100.2 kg   SpO2 100%   BMI 39.15 kg/m  Gen:   Awake, no distress   Resp:  Normal effort, lungs clear to auscultation bilaterally.  Patient speaks in focally sentences without difficulty MSK:   Moves extremities without difficulty; no swelling or tenderness to bilateral lower extremities Other:  +2 radial pulse bilaterally.  CN II through XII intact.  +5 strength to bilateral upper and lower extremities.  Medical Decision Making  Medically screening exam initiated at 5:35 PM.  Appropriate orders placed.  Krystal Ferguson was informed that the remainder of the evaluation will be completed by another provider, this initial triage assessment does not replace that evaluation, and the importance of remaining in the ED until their evaluation is complete.  The patient appears stable so that the remainder of the work up may be completed by another provider.      Dyann Ruddle 09/06/21 1737    Noemi Chapel, MD 09/10/21  [phone]

## 2021-09-06 NOTE — ED Triage Notes (Signed)
PT c/o headache since last week and intermittent chest pain since this weekend that radiates to shoulder blades.

## 2021-09-07 ENCOUNTER — Emergency Department (HOSPITAL_COMMUNITY): Payer: 59

## 2021-09-07 DIAGNOSIS — R0789 Other chest pain: Secondary | ICD-10-CM | POA: Diagnosis not present

## 2021-09-07 DIAGNOSIS — R519 Headache, unspecified: Secondary | ICD-10-CM | POA: Diagnosis not present

## 2021-09-07 DIAGNOSIS — N9489 Other specified conditions associated with female genital organs and menstrual cycle: Secondary | ICD-10-CM | POA: Diagnosis not present

## 2021-09-07 DIAGNOSIS — R072 Precordial pain: Secondary | ICD-10-CM | POA: Diagnosis not present

## 2021-09-07 MED ORDER — PROCHLORPERAZINE EDISYLATE 10 MG/2ML IJ SOLN
10.0000 mg | Freq: Once | INTRAMUSCULAR | Status: AC
  Administered 2021-09-07: 10 mg via INTRAVENOUS
  Filled 2021-09-07: qty 2

## 2021-09-07 MED ORDER — SODIUM CHLORIDE 0.9 % IV BOLUS
500.0000 mL | Freq: Once | INTRAVENOUS | Status: AC
  Administered 2021-09-07: 500 mL via INTRAVENOUS

## 2021-09-07 MED ORDER — SUCRALFATE 1 G PO TABS: 1.0000 g | ORAL_TABLET | Freq: Three times a day (TID) | ORAL | 0 refills | Status: DC

## 2021-09-07 MED ORDER — DEXAMETHASONE SODIUM PHOSPHATE 10 MG/ML IJ SOLN
10.0000 mg | Freq: Once | INTRAMUSCULAR | Status: AC
  Administered 2021-09-07: 10 mg via INTRAVENOUS
  Filled 2021-09-07: qty 1

## 2021-09-07 MED ORDER — DIPHENHYDRAMINE HCL 50 MG/ML IJ SOLN
12.5000 mg | Freq: Once | INTRAMUSCULAR | Status: AC
  Administered 2021-09-07: 12.5 mg via INTRAVENOUS
  Filled 2021-09-07: qty 1

## 2021-09-07 MED ORDER — KETOROLAC TROMETHAMINE 30 MG/ML IJ SOLN: 30.0000 mg | Freq: Once | INTRAMUSCULAR | Status: DC

## 2021-09-07 NOTE — Discharge Instructions (Addendum)
1. Medications: carafate to help with reflux, usual home medications 2. Treatment: rest, drink plenty of fluids,  3. Follow Up: Please followup with your primary doctor in 2-3 days for discussion of your diagnoses and further evaluation after today's visit; if you do not have a primary care doctor use the resource guide provided to find one; Please return to the ER for new or worsening symptoms

## 2021-09-07 NOTE — ED Provider Notes (Signed)
[facility] EMERGENCY DEPARTMENT Provider Note   CSN: [id number] Arrival date & time: 09/06/21  1715     History Chief Complaint  Patient presents with   Chest Pain   Headache    Krystal Ferguson is a 30 y.o. female with a history of migraine headaches presents to the emergency department with headache and intermittent chest pain.  Patient reports she has had headache persistently for the last week.  Reports it is located at the left temple.  Gradual onset and progressively worsening over time.  No thunderclap, loss of consciousness, nausea or vomiting.  She denies numbness, tingling or weakness, slurred speech, visual disturbances, facial asymmetry.  No previous history of CVA.  Additionally, patient reports she had intermittent chest pain since this weekend.  Reports it is midsternal and without radiation.  It is described as a pressure and discomfort and reports when it happens she does feel mildly short of breath.  She denies nausea, vomiting, diaphoresis, leg swelling, palpitations, hemoptysis.  She states she has been under a lot of stress lately.  Does have a history of gastric bypass and previous history of GERD but has not been taking anything recently.  Eating and drinking normally.  No specific aggravating or alleviating factors.  The history is provided by the patient and medical records. No language interpreter was used.      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   Morbid obesity ([place]) 03/12/2016   Prediabetes 10/05/2015   Morbid obesity with BMI of 40.0-44.9, adult ([place]) 10/05/2015   Weakness 04/06/2014    Left-sided weakness 04/06/2014   Migraine headache 04/06/2014   Hypokalemia 04/06/2014   Polycystic ovaries    Chest pain     Past Surgical History:  Procedure Laterality Date   CESAREAN SECTION N/A 05/09/2017   Procedure: CESAREAN SECTION;  Surgeon: Louretta Shorten, MD;  Location: [facility];  Service: Obstetrics;  Laterality: N/A;   LAPAROSCOPIC GASTRIC SLEEVE RESECTION     TPN x5 weeks   WISDOM TOOTH EXTRACTION       OB History     Gravida  2   Para  1   Term  1   Preterm      AB  1   Living  1      SAB  1   IAB      Ectopic      Multiple  0   Live Births  1           Family History  Problem Relation Age of Onset   Bladder Cancer Mother    Thyroid disease Mother    Kidney disease Father    Heart disease Maternal Grandmother    Thyroid disease Maternal Grandmother    Hypertension Paternal Grandmother    Breast cancer Paternal Grandmother    Thyroid disease Maternal Aunt    Heart disease Paternal Grandfather    Diabetes Paternal Grandfather    Lung cancer Paternal Grandfather     Social History   Tobacco Use   Smoking status: Never    Passive exposure:  Never   Smokeless tobacco: Never  Substance Use Topics   Alcohol use: Yes    Alcohol/week: 0.0 standard drinks    Comment: occassional   Drug use: No    Home Medications Prior to Admission medications   Medication Sig Start Date End Date Taking? Authorizing Provider  sucralfate (CARAFATE) 1 g tablet Take 1 tablet (1 g total) by mouth 4 (four) times daily -  with meals and at bedtime. 09/07/21  Yes Ola Raap, Jarrett Soho, PA-C  ALPRAZolam Duanne Moron) 0.5 MG tablet alprazolam 0.5 mg tablet    [provider]  ALPRAZolam (XANAX) 0.5 MG tablet Take 1 tablet (0.5 mg total) by mouth 2 (two) times daily as needed. 08/11/21     benzonatate (TESSALON) 100 MG capsule Take 1-2 capsules (100-200 mg total) by mouth 3 (three) times daily as needed for cough. 12/25/18   Tenna Delaine D, PA-C   brompheniramine-pseudoephedrine-DM 30-2-10 MG/5ML syrup Take 5 mLs by mouth 4 (four) times daily as needed. 12/25/18   Tenna Delaine D, PA-C  Cetirizine HCl (ZYRTEC ALLERGY) 10 MG CAPS Zyrtec 10 mg capsule  Take by oral route.    [provider]  Cholecalciferol 50 MCG (2000 UT) TABS Take by mouth.    [provider]  COVID-19 At Home Antigen Test [facility] COVID-19 HOME TEST) KIT Use as directed within package instructions. 05/12/21   Jefm Bryant, RPH  COVID-19 At [facility] Antigen Test (QUICKVUE AT-HOME COVID-19 TEST) KIT Use as directed 08/16/21   Jefm Bryant, RPH  cyclobenzaprine (FLEXERIL) 5 MG tablet cyclobenzaprine 5 mg tablet    [provider]  fluticasone (FLONASE) 50 MCG/ACT nasal spray Place 2 sprays into both nostrils daily. 12/25/18   Tenna Delaine D, PA-C  Multiple Vitamins-Minerals (MULTIVITAMIN ADULT PO) Take by mouth.    [provider]  Norgestimate-Ethinyl Estradiol Triphasic (TRI FEMYNOR) 0.18/0.215/0.25 MG-35 MCG tablet Take 1 tablet by mouth daily. 06/26/21     Norgestimate-Ethinyl Estradiol Triphasic 0.18/0.215/0.25 MG-35 MCG tablet  01/05/20   [provider]  norgestrel-ethinyl estradiol (LO/OVRAL,CRYSELLE) 0.3-30 MG-MCG tablet Take 1 tablet by mouth daily.    [provider]  pantoprazole (PROTONIX) 40 MG tablet pantoprazole 40 mg tablet,delayed release    [provider]  phentermine (ADIPEX-P) 37.5 MG tablet phentermine 37.5 mg tablet    [provider]  phentermine (ADIPEX-P) 37.5 MG tablet TAKE 1 TABLET BY MOUTH DAILY IN THE MORNING 03/15/21 09/11/21  Greer Pickerel, MD  phentermine 37.5 MG capsule TAKE 1 CAPSULE BY MOUTH EVERY MORNING 01/18/21 07/17/21  Carlena Hurl, PA-C  phentermine 37.5 MG capsule TAKE 1 CAPSULE BY MOUTH ONCE DAILY IN THE MORNING. 12/07/20 06/05/21  Greer Pickerel, MD  phentermine 37.5 MG capsule TAKE 1 CAPSULE BY MOUTH ONCE A DAY 10/19/20 04/17/21  Greer Pickerel, MD  phentermine 37.5  MG capsule Take 1 capsule (37.5 mg total) by mouth every morning. 05/10/21     predniSONE (DELTASONE) 20 MG tablet prednisone 20 mg tablet    [provider]  Prenatal Vit-Fe Fumarate-FA (PRENATAL MULTIVITAMIN) TABS tablet Take 1 tablet by mouth daily at 12 noon.    [provider]  Pseudoephedrine-guaiFENesin [facility] D PO) Take by mouth.    [provider]  Semaglutide, 1 MG/DOSE, (OZEMPIC, 1 MG/DOSE,) 4 MG/3ML SOPN Inject 1 mg into the skin once a week. 08/09/21     Semaglutide,0.25 or 0.5MG/DOS, (OZEMPIC, 0.25 OR 0.5 MG/DOSE,) 2 MG/1.5ML SOPN Inject 0.25 mg into the skin once a week. 07/17/21  Semaglutide,0.25 or 0.5MG/DOS, (OZEMPIC, 0.25 OR 0.5 MG/DOSE,) 2 MG/1.5ML SOPN Inject 0.5 mg into the skin once a week. 07/18/21     Semaglutide-Weight Management (WEGOVY) 0.25 MG/0.5ML SOAJ Inject 0.25 mg into the skin once a week for 4 weeks. 04/20/21     Semaglutide-Weight Management (WEGOVY) 0.25 MG/0.5ML SOAJ Inject 0.25 mg into the skin once a week. 06/02/21     Semaglutide-Weight Management 0.25 MG/0.5ML SOAJ INJECT 0.25MG WEEKLY 03/15/21 03/15/22  Greer Pickerel, MD  tretinoin (RETIN-A) 0.025 % cream tretinoin 0.025 % topical cream    [provider]  triamcinolone cream (KENALOG) 0.5 % Apply 1 application topically to affected area 2 (two) times daily as needed. apply sparingly. 04/11/21       Allergies    Citrus  Review of Systems   Review of Systems  HENT:  Negative for mouth sores.   Respiratory:  Negative for cough, chest tightness, shortness of breath and wheezing.    Physical Exam Updated Vital Signs BP 105/69   Pulse 62   Temp 97.8 F (36.6 C)   Resp 14   Ht 5' 3"  (1.6 m)   Wt 100.2 kg   LMP 08/30/2021   SpO2 100%   BMI 39.15 kg/m   Physical Exam Vitals and nursing note reviewed.  Constitutional:      General: She is not in acute distress.    Appearance: She is well-developed. She is not diaphoretic.  HENT:     Head: Normocephalic and  atraumatic.     Nose: Nose normal.     Mouth/Throat:     Mouth: Mucous membranes are moist.  Eyes:     General: No scleral icterus.    Conjunctiva/sclera: Conjunctivae normal.     Pupils: Pupils are equal, round, and reactive to light.     Comments: No horizontal, vertical or rotational nystagmus  Neck:     Comments: Full active and passive ROM without pain No midline or paraspinal tenderness No nuchal rigidity or meningeal signs Cardiovascular:     Rate and Rhythm: Regular rhythm.  Pulmonary:     Effort: Pulmonary effort is normal. No respiratory distress.     Breath sounds: No wheezing or rales.  Abdominal:     General: There is no distension.     Palpations: Abdomen is soft.     Tenderness: There is no abdominal tenderness. There is no guarding or rebound.  Musculoskeletal:        General: Normal range of motion.     Cervical back: Normal range of motion and neck supple.  Lymphadenopathy:     Cervical: No cervical adenopathy.  Skin:    General: Skin is warm and dry.     Findings: No rash.  Neurological:     Mental Status: She is alert and oriented to person, place, and time.     Cranial Nerves: No cranial nerve deficit.     Motor: No abnormal muscle tone.     Coordination: Coordination normal.     Comments: Mental Status:  Alert, oriented, thought content appropriate. Speech fluent without evidence of aphasia. Able to follow 2 step commands without difficulty.  Cranial Nerves:  II:  Peripheral visual fields grossly normal, pupils equal, round, reactive to light III,IV, VI: ptosis not present, extra-ocular motions intact bilaterally  V,VII: smile symmetric, facial light touch sensation equal VIII: hearing grossly normal bilaterally  IX,X: midline uvula rise  XI: bilateral shoulder shrug equal and strong XII: midline tongue extension  Motor:  5/5 in  upper and lower extremities bilaterally including strong and equal grip strength and dorsiflexion/plantar  flexion Sensory: Pinprick and light touch normal in all extremities.  Cerebellar: normal finger-to-nose with bilateral upper extremities Gait: normal gait and balance CV: distal pulses palpable throughout   Psychiatric:        Behavior: Behavior normal.        Thought Content: Thought content normal.        Judgment: Judgment normal.    ED Results / Procedures / Treatments   Labs (all labs ordered are listed, but only abnormal results are displayed) Labs Reviewed  BASIC METABOLIC PANEL  CBC WITH DIFFERENTIAL/PLATELET  I-STAT BETA HCG BLOOD, ED ([facility], [facility], [facility] ONLY)  TROPONIN I (HIGH SENSITIVITY)  TROPONIN I (HIGH SENSITIVITY)    EKG EKG Interpretation  Date/Time:  Wednesday September 06 2021 17:27:14 EDT Ventricular Rate:  76 PR Interval:  126 QRS Duration: 82 QT Interval:  378 QTC Calculation: 425 R Axis:   41 Text Interpretation: Normal sinus rhythm with sinus arrhythmia Normal ECG Confirmed by Quintella Reichert [phone]) on 09/07/2021 3:46:54 AM  Radiology DG Chest 2 View  Result Date: 09/06/2021 CLINICAL DATA:  Chest pain.  Shortness of breath.  Headaches. EXAM: CHEST - 2 VIEW COMPARISON:  08/22/2012 FINDINGS: Heart size is normal. Mediastinal shadows are normal. The lungs are clear. No bronchial thickening. No infiltrate, mass, effusion or collapse. Pulmonary vascularity is normal. No bony abnormality. IMPRESSION: Normal chest Electronically Signed   By: Nelson Chimes M.D.   On: 09/06/2021 18:28   CT HEAD WO CONTRAST (5MM)  Result Date: 09/07/2021 CLINICAL DATA:  Severe acute headache EXAM: CT HEAD WITHOUT CONTRAST TECHNIQUE: Contiguous axial images were obtained from the base of the skull through the vertex without intravenous contrast. COMPARISON:  04/06/2014 FINDINGS: Brain: No evidence of acute infarction, hemorrhage, hydrocephalus, extra-axial collection or mass lesion/mass effect. Small chronic posterior left frontal subcortical white matter bandlike hypodensity, image 20/4.  Vascular: No hyperdense vessel or unexpected calcification. Skull: Normal. Negative for fracture or focal lesion. Sinuses/Orbits: No acute finding. Other: None. IMPRESSION: No acute intracranial abnormality.  No new finding. Stable posterior left frontal lobe subcortical chronic hypodensity. Please refer to the prior MRI reports. Electronically Signed   By: Jerilynn Mages.  Shick M.D.   On: 09/07/2021 06:51    Procedures Procedures   Medications Ordered in ED Medications  sodium chloride 0.9 % bolus 500 mL (0 mLs Intravenous Stopped 09/07/21 0637)  prochlorperazine (COMPAZINE) injection 10 mg (10 mg Intravenous Given 09/07/21 0532)  diphenhydrAMINE (BENADRYL) injection 12.5 mg (12.5 mg Intravenous Given 09/07/21 0531)  dexamethasone (DECADRON) injection 10 mg (10 mg Intravenous Given 09/07/21 0532)    ED Course  I have reviewed the triage vital signs and the nursing notes.  Pertinent labs & imaging results that were available during my care of the patient were reviewed by me and considered in my medical decision making (see chart for details).    MDM Rules/Calculators/A&P                           Patient presents with chest pain and headache.  Normal neurologic exam.  Will give migraine cocktail.  EKG reassuring, normal chest x-ray and normal troponin.  Highly doubt cardiac etiology of patient's chest pain.  Will give Carafate in addition to her daily Protonix for acid reflux.  7:00 AM Pt reports she is feeling much better.  CT head without acute abnormality.  Pt remains neurologically intact.  Final Clinical Impression(s) / ED Diagnoses Final diagnoses:  Atypical chest pain  Left-sided headache    Rx / DC Orders ED Discharge Orders          Ordered    sucralfate (CARAFATE) 1 g tablet  3 times daily with meals & bedtime        09/07/21 0657             Tobie Hellen, Jarrett Soho, PA-C 09/07/21 0701    Quintella Reichert, MD 09/07/21 [phone]

## 2021-09-28 ENCOUNTER — Other Ambulatory Visit (HOSPITAL_COMMUNITY): Payer: Self-pay

## 2021-09-28 DIAGNOSIS — R7303 Prediabetes: Secondary | ICD-10-CM | POA: Diagnosis not present

## 2021-09-28 DIAGNOSIS — Z6835 Body mass index (BMI) 35.0-35.9, adult: Secondary | ICD-10-CM | POA: Diagnosis not present

## 2021-09-28 DIAGNOSIS — E6609 Other obesity due to excess calories: Secondary | ICD-10-CM | POA: Diagnosis not present

## 2021-09-28 MED ORDER — PHENTERMINE HCL 37.5 MG PO TABS
37.5000 mg | ORAL_TABLET | Freq: Every morning | ORAL | 0 refills | Status: DC
  Filled 2021-09-28: qty 30, 30d supply, fill #0

## 2021-12-07 DIAGNOSIS — N925 Other specified irregular menstruation: Secondary | ICD-10-CM | POA: Diagnosis not present

## 2021-12-08 ENCOUNTER — Other Ambulatory Visit (HOSPITAL_COMMUNITY): Payer: Self-pay

## 2021-12-08 MED ORDER — MEDROXYPROGESTERONE ACETATE 10 MG PO TABS
10.0000 mg | ORAL_TABLET | Freq: Every day | ORAL | 0 refills | Status: DC
  Filled 2021-12-08: qty 7, 7d supply, fill #0

## 2022-02-01 DIAGNOSIS — Z3169 Encounter for other general counseling and advice on procreation: Secondary | ICD-10-CM | POA: Diagnosis not present

## 2022-02-01 DIAGNOSIS — N926 Irregular menstruation, unspecified: Secondary | ICD-10-CM | POA: Diagnosis not present

## 2022-04-02 ENCOUNTER — Other Ambulatory Visit (HOSPITAL_COMMUNITY): Payer: Self-pay

## 2022-04-02 MED ORDER — CLOMIPHENE CITRATE 50 MG PO TABS
50.0000 mg | ORAL_TABLET | Freq: Every day | ORAL | 0 refills | Status: AC
  Filled 2022-04-02: qty 5, 5d supply, fill #0

## 2022-04-18 DIAGNOSIS — N979 Female infertility, unspecified: Secondary | ICD-10-CM | POA: Diagnosis not present

## 2022-04-20 ENCOUNTER — Other Ambulatory Visit (HOSPITAL_COMMUNITY): Payer: Self-pay

## 2022-04-26 ENCOUNTER — Other Ambulatory Visit (HOSPITAL_COMMUNITY): Payer: Self-pay

## 2022-04-26 MED ORDER — CLOMID 50 MG PO TABS
50.0000 mg | ORAL_TABLET | Freq: Every day | ORAL | 0 refills | Status: DC
  Filled 2022-04-26: qty 5, 5d supply, fill #0

## 2022-05-24 DIAGNOSIS — N912 Amenorrhea, unspecified: Secondary | ICD-10-CM | POA: Diagnosis not present

## 2022-05-25 ENCOUNTER — Other Ambulatory Visit (HOSPITAL_COMMUNITY): Payer: Self-pay

## 2022-05-25 MED ORDER — PROGESTERONE MICRONIZED 100 MG PO CAPS
ORAL_CAPSULE | ORAL | 2 refills | Status: DC
  Filled 2022-05-25: qty 30, 30d supply, fill #0
  Filled 2022-06-21: qty 30, 30d supply, fill #1

## 2022-05-28 DIAGNOSIS — N912 Amenorrhea, unspecified: Secondary | ICD-10-CM | POA: Diagnosis not present

## 2022-06-14 DIAGNOSIS — N911 Secondary amenorrhea: Secondary | ICD-10-CM | POA: Diagnosis not present

## 2022-06-21 ENCOUNTER — Other Ambulatory Visit (HOSPITAL_COMMUNITY): Payer: Self-pay

## 2022-06-21 DIAGNOSIS — Z3403 Encounter for supervision of normal first pregnancy, third trimester: Secondary | ICD-10-CM | POA: Diagnosis not present

## 2022-06-21 DIAGNOSIS — Z3685 Encounter for antenatal screening for Streptococcus B: Secondary | ICD-10-CM | POA: Diagnosis not present

## 2022-06-21 DIAGNOSIS — Z3402 Encounter for supervision of normal first pregnancy, second trimester: Secondary | ICD-10-CM | POA: Diagnosis not present

## 2022-06-21 DIAGNOSIS — Z3400 Encounter for supervision of normal first pregnancy, unspecified trimester: Secondary | ICD-10-CM | POA: Diagnosis not present

## 2022-06-21 DIAGNOSIS — Z3481 Encounter for supervision of other normal pregnancy, first trimester: Secondary | ICD-10-CM | POA: Diagnosis not present

## 2022-06-21 DIAGNOSIS — Z3A32 32 weeks gestation of pregnancy: Secondary | ICD-10-CM | POA: Diagnosis not present

## 2022-06-21 LAB — OB RESULTS CONSOLE HEPATITIS B SURFACE ANTIGEN: Hepatitis B Surface Ag: NEGATIVE

## 2022-06-21 LAB — OB RESULTS CONSOLE ABO/RH: RH Type: POSITIVE

## 2022-06-21 LAB — OB RESULTS CONSOLE RUBELLA ANTIBODY, IGM: Rubella: IMMUNE

## 2022-06-21 LAB — OB RESULTS CONSOLE RPR: RPR: NONREACTIVE

## 2022-06-21 LAB — OB RESULTS CONSOLE ANTIBODY SCREEN: Antibody Screen: NEGATIVE

## 2022-06-21 LAB — OB RESULTS CONSOLE HIV ANTIBODY (ROUTINE TESTING): HIV: NONREACTIVE

## 2022-06-21 LAB — HEPATITIS C ANTIBODY: HCV Ab: NEGATIVE

## 2022-07-06 DIAGNOSIS — Z3400 Encounter for supervision of normal first pregnancy, unspecified trimester: Secondary | ICD-10-CM | POA: Diagnosis not present

## 2022-07-06 DIAGNOSIS — Z113 Encounter for screening for infections with a predominantly sexual mode of transmission: Secondary | ICD-10-CM | POA: Diagnosis not present

## 2022-07-06 DIAGNOSIS — Z331 Pregnant state, incidental: Secondary | ICD-10-CM | POA: Diagnosis not present

## 2022-07-06 DIAGNOSIS — Z3481 Encounter for supervision of other normal pregnancy, first trimester: Secondary | ICD-10-CM | POA: Diagnosis not present

## 2022-07-06 DIAGNOSIS — Z3A10 10 weeks gestation of pregnancy: Secondary | ICD-10-CM | POA: Diagnosis not present

## 2022-07-06 LAB — OB RESULTS CONSOLE GC/CHLAMYDIA
Chlamydia: NEGATIVE
Neisseria Gonorrhea: NEGATIVE

## 2022-07-20 DIAGNOSIS — S8011XA Contusion of right lower leg, initial encounter: Secondary | ICD-10-CM | POA: Diagnosis not present

## 2022-07-25 ENCOUNTER — Other Ambulatory Visit: Payer: Self-pay

## 2022-07-25 ENCOUNTER — Emergency Department (HOSPITAL_BASED_OUTPATIENT_CLINIC_OR_DEPARTMENT_OTHER)
Admission: EM | Admit: 2022-07-25 | Discharge: 2022-07-26 | Disposition: A | Discharge status: Home / Self Care | Payer: 59 | Attending: Emergency Medicine | Admitting: Emergency Medicine

## 2022-07-25 ENCOUNTER — Encounter (HOSPITAL_BASED_OUTPATIENT_CLINIC_OR_DEPARTMENT_OTHER): Payer: Self-pay

## 2022-07-25 DIAGNOSIS — R0602 Shortness of breath: Secondary | ICD-10-CM | POA: Diagnosis not present

## 2022-07-25 DIAGNOSIS — R0789 Other chest pain: Secondary | ICD-10-CM | POA: Diagnosis not present

## 2022-07-25 DIAGNOSIS — O26891 Other specified pregnancy related conditions, first trimester: Secondary | ICD-10-CM | POA: Insufficient documentation

## 2022-07-25 DIAGNOSIS — Z20822 Contact with and (suspected) exposure to covid-19: Secondary | ICD-10-CM | POA: Insufficient documentation

## 2022-07-25 DIAGNOSIS — Z3A13 13 weeks gestation of pregnancy: Secondary | ICD-10-CM | POA: Diagnosis not present

## 2022-07-25 DIAGNOSIS — Z794 Long term (current) use of insulin: Secondary | ICD-10-CM | POA: Insufficient documentation

## 2022-07-25 DIAGNOSIS — Z3682 Encounter for antenatal screening for nuchal translucency: Secondary | ICD-10-CM | POA: Diagnosis not present

## 2022-07-25 LAB — CBC
HCT: 38.1 % (ref 36.0–46.0)
Hemoglobin: 12.5 g/dL (ref 12.0–15.0)
MCH: 29.5 pg (ref 26.0–34.0)
MCHC: 32.8 g/dL (ref 30.0–36.0)
MCV: 89.9 fL (ref 80.0–100.0)
Platelets: 187 10*3/uL (ref 150–400)
RBC: 4.24 MIL/uL (ref 3.87–5.11)
RDW: 12.6 % (ref 11.5–15.5)
WBC: 8.7 10*3/uL (ref 4.0–10.5)
nRBC: 0 % (ref 0.0–0.2)

## 2022-07-25 LAB — TROPONIN I (HIGH SENSITIVITY): Troponin I (High Sensitivity): <2 ng/L (ref <18)

## 2022-07-25 MED ORDER — CALCIUM CARBONATE ANTACID 500 MG PO CHEW
1.0000 | CHEWABLE_TABLET | Freq: Once | ORAL | Status: AC
  Administered 2022-07-26: 200 mg via ORAL
  Filled 2022-07-25: qty 1

## 2022-07-25 NOTE — ED Triage Notes (Signed)
Pt presents to the ED with aching/throbbing intermittent chest pain that started today. Pt is [redacted] weeks pregnant and states that she had an OB US today that was normal. Pt reports mild SHOB that accompanies the chest pain. Pt A&Ox4 at time of triage. VSS. Pt does report a superficial blood clot in her right leg that her PCP has been having her put warm compresses on.

## 2022-07-25 NOTE — ED Provider Notes (Signed)
[facility] DEPT Provider Note   CSN: [id number] Arrival date & time: 07/25/22  1956     History {Add pertinent medical, surgical, social history, OB history to HPI:1} Chief Complaint  Patient presents with   Chest Pain    Krystal Ferguson is a 31 y.o. female.  Patient as above with significant medical history as below, including PCOS, migraine headache, approximately [redacted] weeks gestation who presents to the ED with complaint of chest pain.  Tingling to arms and legs.  Patient reports symptom onset within the last 24 hours, chest pain began around noon today.  Chest pain is intermittent, left-sided, aching, radiation to left shoulder.  Not associated with diaphoresis, nausea, dyspnea, fever or chills.  Currently asymptomatic.  No trauma.  No URI symptoms, no hemoptysis, no abdominal pain, no cramping, no contractions, no vaginal bleeding or discharge, no rash or fluid.  Patient reports she had OB ultrasound earlier today which was within normal limits per patient.  Also reports that she was diagnosed with a superficial thrombosis to her right lower extremity which she has been applying warm compresses. No hx DVT, no hx PE.     Past Medical History:  Diagnosis Date   Chest pain    Migraine    Polycystic ovaries    Vaginal Pap smear, abnormal     Past Surgical History:  Procedure Laterality Date   CESAREAN SECTION N/A 05/09/2017   Procedure: CESAREAN SECTION;  Surgeon: Louretta Shorten, MD;  Location: [facility];  Service: Obstetrics;  Laterality: N/A;   LAPAROSCOPIC GASTRIC SLEEVE RESECTION     TPN x5 weeks   WISDOM TOOTH EXTRACTION       The history is provided by the patient. No language interpreter was used.  Chest Pain Associated symptoms: no abdominal pain, no back pain, no cough, no dysphagia, no fever, no headache, no nausea, no palpitations and no shortness of breath        Home Medications Prior to Admission medications   Medication Sig  Start Date End Date Taking? Authorizing Provider  ALPRAZolam Duanne Moron) 0.5 MG tablet alprazolam 0.5 mg tablet    [provider]  ALPRAZolam (XANAX) 0.5 MG tablet Take 1 tablet (0.5 mg total) by mouth 2 (two) times daily as needed. 08/11/21     benzonatate (TESSALON) 100 MG capsule Take 1-2 capsules (100-200 mg total) by mouth 3 (three) times daily as needed for cough. 12/25/18   Tenna Delaine D, PA-C  brompheniramine-pseudoephedrine-DM 30-2-10 MG/5ML syrup Take 5 mLs by mouth 4 (four) times daily as needed. 12/25/18   Tenna Delaine D, PA-C  Cetirizine HCl (ZYRTEC ALLERGY) 10 MG CAPS Zyrtec 10 mg capsule  Take by oral route.    [provider]  Cholecalciferol 50 MCG (2000 UT) TABS Take by mouth.    [provider]  clomiPHENE (CLOMID) 50 MG tablet Take 1 tablet (50 mg total) by mouth daily on days 3 through 7. 04/26/22     COVID-19 At Home Antigen Test (CARESTART COVID-19 HOME TEST) KIT Use as directed within package instructions. 05/12/21   Jefm Bryant, RPH  COVID-19 At [facility] Antigen Test (QUICKVUE AT-HOME COVID-19 TEST) KIT Use as directed 08/16/21   Jefm Bryant, RPH  cyclobenzaprine (FLEXERIL) 5 MG tablet cyclobenzaprine 5 mg tablet    [provider]  fluticasone (FLONASE) 50 MCG/ACT nasal spray Place 2 sprays into both nostrils daily. 12/25/18   Tenna Delaine D, PA-C  medroxyPROGESTERone (PROVERA) 10 MG tablet Take 1 tablet (  10 mg total) by mouth at bedtime. 12/08/21     Multiple Vitamins-Minerals (MULTIVITAMIN ADULT PO) Take by mouth.    [provider]  Norgestimate-Ethinyl Estradiol Triphasic (TRI FEMYNOR) 0.18/0.215/0.25 MG-35 MCG tablet Take 1 tablet by mouth daily. 06/26/21     Norgestimate-Ethinyl Estradiol Triphasic 0.18/0.215/0.25 MG-35 MCG tablet  01/05/20   [provider]  norgestrel-ethinyl estradiol (LO/OVRAL,CRYSELLE) 0.3-30 MG-MCG tablet Take 1 tablet by mouth daily.    [provider]  pantoprazole (PROTONIX)  40 MG tablet pantoprazole 40 mg tablet,delayed release    [provider]  phentermine (ADIPEX-P) 37.5 MG tablet phentermine 37.5 mg tablet    [provider]  phentermine (ADIPEX-P) 37.5 MG tablet TAKE 1 TABLET BY MOUTH DAILY IN THE MORNING 03/15/21 09/11/21  Greer Pickerel, MD  phentermine (ADIPEX-P) 37.5 MG tablet Take 1 tablet (37.5 mg total) by mouth every morning before breakfast 09/28/21     phentermine 37.5 MG capsule TAKE 1 CAPSULE BY MOUTH EVERY MORNING 01/18/21 07/17/21  Maczis, Carlena Hurl, PA-C  phentermine 37.5 MG capsule TAKE 1 CAPSULE BY MOUTH ONCE DAILY IN THE MORNING. 12/07/20 06/05/21  Greer Pickerel, MD  phentermine 37.5 MG capsule TAKE 1 CAPSULE BY MOUTH ONCE A DAY 10/19/20 04/17/21  Greer Pickerel, MD  phentermine 37.5 MG capsule Take 1 capsule (37.5 mg total) by mouth every morning. 05/10/21     predniSONE (DELTASONE) 20 MG tablet prednisone 20 mg tablet    [provider]  Prenatal Vit-Fe Fumarate-FA (PRENATAL MULTIVITAMIN) TABS tablet Take 1 tablet by mouth daily at 12 noon.    [provider]  progesterone (PROMETRIUM) 100 MG capsule Take 1 capsule by mouth  every day 05/25/22     Pseudoephedrine-guaiFENesin ([place] D PO) Take by mouth.    [provider]  Semaglutide, 1 MG/DOSE, (OZEMPIC, 1 MG/DOSE,) 4 MG/3ML SOPN Inject 1 mg into the skin once a week. 08/09/21     Semaglutide,0.25 or 0.5MG/DOS, (OZEMPIC, 0.25 OR 0.5 MG/DOSE,) 2 MG/1.5ML SOPN Inject 0.25 mg into the skin once a week. 07/17/21     Semaglutide,0.25 or 0.5MG/DOS, (OZEMPIC, 0.25 OR 0.5 MG/DOSE,) 2 MG/1.5ML SOPN Inject 0.5 mg into the skin once a week. 07/18/21     Semaglutide-Weight Management (WEGOVY) 0.25 MG/0.5ML SOAJ Inject 0.25 mg into the skin once a week for 4 weeks. 04/20/21     Semaglutide-Weight Management (WEGOVY) 0.25 MG/0.5ML SOAJ Inject 0.25 mg into the skin once a week. 06/02/21     sucralfate (CARAFATE) 1 g tablet Take 1 tablet (1 g total) by mouth 4 (four) times daily -  with  meals and at bedtime. 09/07/21   Muthersbaugh, Jarrett Soho, PA-C  tretinoin (RETIN-A) 0.025 % cream tretinoin 0.025 % topical cream    [provider]  triamcinolone cream (KENALOG) 0.5 % Apply 1 application topically to affected area 2 (two) times daily as needed. apply sparingly. 04/11/21         Allergies    Citrus    Review of Systems   Review of Systems  Constitutional:  Negative for activity change and fever.  HENT:  Negative for facial swelling and trouble swallowing.   Eyes:  Negative for discharge and redness.  Respiratory:  Negative for cough and shortness of breath.   Cardiovascular:  Positive for chest pain. Negative for palpitations.  Gastrointestinal:  Negative for abdominal pain and nausea.  Genitourinary:  Negative for dysuria and flank pain.  Musculoskeletal:  Negative for back pain and gait problem.  Skin:  Negative for pallor  and rash.  Neurological:  Negative for syncope and headaches.    Physical Exam Updated Vital Signs BP 105/88   Pulse 70   Temp 98.4 F (36.9 C)   Resp 18   Ht 5' 3"  (1.6 m)   Wt 100.2 kg   LMP 08/30/2021   SpO2 99%   BMI 39.15 kg/m  Physical Exam Vitals and nursing note reviewed.  Constitutional:      General: She is not in acute distress.    Appearance: Normal appearance. She is well-developed. She is not ill-appearing or diaphoretic.  HENT:     Head: Normocephalic and atraumatic.     Right Ear: External ear normal.     Left Ear: External ear normal.     Nose: Nose normal.     Mouth/Throat:     Mouth: Mucous membranes are moist.  Eyes:     General: No scleral icterus.       Right eye: No discharge.        Left eye: No discharge.  Cardiovascular:     Rate and Rhythm: Normal rate and regular rhythm.     Pulses: Normal pulses.     Heart sounds: Normal heart sounds.     No systolic murmur is present.     No diastolic murmur is present.  Pulmonary:     Effort: Pulmonary effort is normal. No respiratory distress.      Breath sounds: Normal breath sounds. No decreased breath sounds or wheezing.  Abdominal:     General: Abdomen is flat.     Tenderness: There is no abdominal tenderness.  Musculoskeletal:        General: Normal range of motion.     Cervical back: Normal range of motion.     Right lower leg: No edema.     Left lower leg: No edema.  Skin:    General: Skin is warm and dry.     Capillary Refill: Capillary refill takes less than 2 seconds.  Neurological:     Mental Status: She is alert.  Psychiatric:        Mood and Affect: Mood normal.        Behavior: Behavior normal.     ED Results / Procedures / Treatments   Labs (all labs ordered are listed, but only abnormal results are displayed) Labs Reviewed  BASIC METABOLIC PANEL  CBC  PROTIME-INR  APTT  TROPONIN I (HIGH SENSITIVITY)    EKG None  Radiology No results found.  Procedures Procedures  {Document cardiac monitor, telemetry assessment procedure when appropriate:1}  Medications Ordered in ED Medications - No data to display  ED Course/ Medical Decision Making/ A&P                           Medical Decision Making Amount and/or Complexity of Data Reviewed Labs: ordered.    CC: cp  This patient presents to the Emergency Department for the above complaint. This involves an extensive number of treatment options and is a complaint that carries with it a high risk of complications and morbidity. Vital signs were reviewed. Serious etiologies considered.  Differential includes all life-threatening causes for chest pain. This includes but is not exclusive to acute coronary syndrome, aortic dissection, pulmonary embolism, cardiac tamponade, community-acquired pneumonia, pericarditis, musculoskeletal chest wall pain, etc.  Record review:  Previous records obtained and reviewed prior office visits, prior labs and imaging  Additional history obtained from spouse  Medical and surgical  history as noted above.   Work up  as above, notable for:  Labs & imaging results that were available during my care of the patient were visualized by me and considered in my medical decision making.  Physical exam as above.   I ordered imaging studies which included ***. I visualized the imaging, interpreted images, and I agree with radiologist interpretation. ***  Cardiac monitoring reviewed and interpreted personally which shows NSR   Management: antacid  ED Course:     Reassessment:  ***  Admission was considered.      The patient's chest pain is not suggestive of pulmonary embolus, cardiac ischemia, aortic dissection, pericarditis, myocarditis, pulmonary embolism, pneumothorax, pneumonia, Zoster, or esophageal perforation, or other serious etiology.  Historically not abrupt in onset, tearing or ripping, pulses symmetric. EKG nonspecific for ischemia/infarction. No dysrhythmias, brugada, WPW, prolonged QT noted.   [CXR reviewed and WNL]   [Troponin negative x2. CXR reviewed. Labs without demonstration of acute pathology unless otherwise noted above. Low HEART Score: 0-3 points (0.9-1.7% risk of MACE).]   Given the extremely low risk of these diagnoses further testing and evaluation for these possibilities does not appear to be indicated at this time. Patient in no distress and overall condition improved here in the ED. Detailed discussions were had with the patient regarding current findings, and need for close f/u with PCP or on call doctor. The patient has been instructed to return immediately if the symptoms worsen in any way for re-evaluation. Patient verbalized understanding and is in agreement with current care plan. All questions answered prior to discharge.           Social determinants of health include -  Social History   Socioeconomic History   Marital status: Married    Spouse name: Not on file   Number of children: Not on file   Years of education: Not on file   Highest education  level: Not on file  Occupational History   Not on file  Tobacco Use   Smoking status: Never    Passive exposure: Never   Smokeless tobacco: Never  Substance and Sexual Activity   Alcohol use: Not Currently    Comment: occassional   Drug use: No   Sexual activity: Yes    Birth control/protection: Pill  Other Topics Concern   Not on file  Social History Narrative   Not on file   Social Determinants of Health   Financial Resource Strain: Not on file  Food Insecurity: Not on file  Transportation Needs: Not on file  Physical Activity: Not on file  Stress: Not on file  Social Connections: Not on file  Intimate Partner Violence: Not on file      This chart was dictated using voice recognition software.  Despite best efforts to proofread,  errors can occur which can change the documentation meaning.   {Document critical care time when appropriate:1} {Document review of labs and clinical decision tools ie heart score, Chads2Vasc2 etc:1}  {Document your independent review of radiology images, and any outside records:1} {Document your discussion with family members, caretakers, and with consultants:1} {Document social determinants of health affecting pt's care:1} {Document your decision making why or why not admission, treatments were needed:1} Final Clinical Impression(s) / ED Diagnoses Final diagnoses:  None    Rx / DC Orders ED Discharge Orders     None

## 2022-07-26 DIAGNOSIS — Z3A13 13 weeks gestation of pregnancy: Secondary | ICD-10-CM | POA: Diagnosis not present

## 2022-07-26 DIAGNOSIS — O26891 Other specified pregnancy related conditions, first trimester: Secondary | ICD-10-CM | POA: Diagnosis not present

## 2022-07-26 DIAGNOSIS — Z20822 Contact with and (suspected) exposure to covid-19: Secondary | ICD-10-CM | POA: Diagnosis not present

## 2022-07-26 DIAGNOSIS — Z794 Long term (current) use of insulin: Secondary | ICD-10-CM | POA: Diagnosis not present

## 2022-07-26 DIAGNOSIS — R0602 Shortness of breath: Secondary | ICD-10-CM | POA: Diagnosis not present

## 2022-07-26 DIAGNOSIS — R0789 Other chest pain: Secondary | ICD-10-CM | POA: Diagnosis not present

## 2022-07-26 LAB — RESP PANEL BY RT-PCR (FLU A&B, COVID) ARPGX2
Influenza A by PCR: NEGATIVE
Influenza B by PCR: NEGATIVE
SARS Coronavirus 2 by RT PCR: NEGATIVE

## 2022-07-26 LAB — BASIC METABOLIC PANEL
Anion gap: 10 (ref 5–15)
BUN: 11 mg/dL (ref 6–20)
CO2: 24 mmol/L (ref 22–32)
Calcium: 9.2 mg/dL (ref 8.9–10.3)
Chloride: 103 mmol/L (ref 98–111)
Creatinine, Ser: 0.71 mg/dL (ref 0.44–1.00)
GFR, Estimated: >60 mL/min (ref >60)
Glucose, Bld: 83 mg/dL (ref 70–99)
Potassium: 3.4 mmol/L — ABNORMAL LOW (ref 3.5–5.1)
Sodium: 137 mmol/L (ref 135–145)

## 2022-07-26 LAB — PROTIME-INR
INR: 1 (ref 0.8–1.2)
Prothrombin Time: 12.7 seconds (ref 11.4–15.2)

## 2022-07-26 LAB — BRAIN NATRIURETIC PEPTIDE: B Natriuretic Peptide: 24.1 pg/mL (ref 0.0–100.0)

## 2022-07-26 LAB — APTT: aPTT: 27 seconds (ref 24–36)

## 2022-07-26 NOTE — ED Notes (Signed)
Patient ambulated to restroom.

## 2022-07-26 NOTE — Discharge Instructions (Signed)
It was a pleasure caring for you today in the emergency department.  Please return to the emergency department for any worsening or worrisome symptoms.  Return to the Emergency Department if you have unusual chest pain, pressure, or discomfort, shortness of breath, nausea, vomiting, burping, heartburn, tingling upper body parts, sweating, cold, clammy skin, or racing heartbeat. Call 911 if you think you are having a heart attack.  Follow cardiac diet - avoid fatty & fried foods, don't eat too much red meat, eat lots of fruits & vegetables, and dairy products should be low fat. Please lose weight if you are overweight. Become more active with walking, gardening, or any other activity that gets you to moving.   Please return to the emergency department immediately for any new or concerning symptoms, or if you get worse.

## 2022-07-26 NOTE — ED Notes (Signed)
Reviewed AVS/discharge instruction with patient. Time allotted for and all questions answered. Patient is agreeable for d/c and escorted to ed exit by staff.  

## 2022-08-05 ENCOUNTER — Ambulatory Visit
Admission: EM | Admit: 2022-08-05 | Discharge: 2022-08-05 | Disposition: A | Discharge status: Home / Self Care | Payer: 59 | Attending: Emergency Medicine | Admitting: Emergency Medicine

## 2022-08-05 DIAGNOSIS — J020 Streptococcal pharyngitis: Secondary | ICD-10-CM | POA: Diagnosis not present

## 2022-08-05 LAB — POCT RAPID STREP A (OFFICE): Rapid Strep A Screen: POSITIVE — AB

## 2022-08-05 MED ORDER — PENICILLIN V POTASSIUM 500 MG PO TABS: 500.0000 mg | ORAL_TABLET | Freq: Two times a day (BID) | ORAL | 0 refills | Status: AC

## 2022-08-05 NOTE — Discharge Instructions (Addendum)
Your strep was positive.  1 gram of Tylenol 3-4 times a day as needed for pain.  Make sure you drink plenty of extra fluids.  Some people find salt water gargles and  Traditional Medicinal's "Throat Coat" tea helpful. Take 5 mL of liquid Benadryl and 5 mL of Maalox. Mix it together, and then hold it in your mouth for as long as you can and then swallow. You may do this 4 times a day.    Go to www.goodrx.com  or www.costplusdrugs.com to look up your medications. This will give you a list of where you can find your prescriptions at the most affordable prices. Or ask the pharmacist what the cash price is, or if they have any other discount programs available to help make your medication more affordable. This can be less expensive than what you would pay with insurance.

## 2022-08-05 NOTE — ED Notes (Signed)
Sore throat started yesterday. Husband is being treated for URI and son has Impetigo. Some difficulty eating and drinking.   Pain 4/10

## 2022-08-05 NOTE — ED Provider Notes (Signed)
HPI  SUBJECTIVE:  Patient reports sore throat starting yesterday.  Sx worse with swallowing, talking.  Sx better with cold liquids.  No fever   No neck stiffness  No Cough No nasal congestion, rhinorrhea No Myalgias No Headache No Rash  No loss of taste or smell No shortness of breath or difficulty breathing No nausea, vomiting No diarrhea No abdominal pain     No Recent Strep exposure, but husband has a sore throat and is being treated with azithromycin  No Breathing difficulty, voice changes, sensation of throat swelling shut No Drooling No Trismus No abx in past month.  No antipyretic in past 4-6 hrs Patient is 14-1/[redacted] weeks pregnant.  She states pregnancy is going well.  She has had an ultrasound confirming the location of the pregnancy and is getting prenatal care  Past Medical History:  Diagnosis Date   Chest pain    Migraine    Polycystic ovaries    Vaginal Pap smear, abnormal     Past Surgical History:  Procedure Laterality Date   CESAREAN SECTION N/A 05/09/2017   Procedure: CESAREAN SECTION;  Surgeon: Louretta Shorten, MD;  Location: [facility];  Service: Obstetrics;  Laterality: N/A;   LAPAROSCOPIC GASTRIC SLEEVE RESECTION     TPN x5 weeks   WISDOM TOOTH EXTRACTION      Family History  Problem Relation Age of Onset   Bladder Cancer Mother    Thyroid disease Mother    Kidney disease Father    Heart disease Maternal Grandmother    Thyroid disease Maternal Grandmother    Hypertension Paternal Grandmother    Breast cancer Paternal Grandmother    Thyroid disease Maternal Aunt    Heart disease Paternal Grandfather    Diabetes Paternal Grandfather    Lung cancer Paternal Grandfather     Social History   Tobacco Use   Smoking status: Never    Passive exposure: Never   Smokeless tobacco: Never  Substance Use Topics   Alcohol use: Not Currently    Comment: occassional   Drug use: No    No current facility-administered medications for this  encounter.  Current Outpatient Medications:    penicillin v potassium (VEETID) 500 MG tablet, Take 1 tablet (500 mg total) by mouth 2 (two) times daily for 10 days. X 10 days, Disp: 20 tablet, Rfl: 0   Prenatal Vit-Fe Fumarate-FA (PRENATAL MULTIVITAMIN) TABS tablet, Take 1 tablet by mouth daily at 12 noon., Disp: , Rfl:    benzonatate (TESSALON) 100 MG capsule, Take 1-2 capsules (100-200 mg total) by mouth 3 (three) times daily as needed for cough., Disp: 40 capsule, Rfl: 0   Cetirizine HCl (ZYRTEC ALLERGY) 10 MG CAPS, Zyrtec 10 mg capsule  Take by oral route., Disp: , Rfl:    Cholecalciferol 50 MCG (2000 UT) TABS, Take by mouth., Disp: , Rfl:    clomiPHENE (CLOMID) 50 MG tablet, Take 1 tablet (50 mg total) by mouth daily on days 3 through 7., Disp: 5 tablet, Rfl: 0   COVID-19 At Home Antigen Test (CARESTART COVID-19 HOME TEST) KIT, Use as directed within package instructions., Disp: 4 each, Rfl: 0   COVID-19 At Home Antigen Test (QUICKVUE AT-HOME COVID-19 TEST) KIT, Use as directed, Disp: 2 each, Rfl: 0   cyclobenzaprine (FLEXERIL) 5 MG tablet, cyclobenzaprine 5 mg tablet, Disp: , Rfl:    fluticasone (FLONASE) 50 MCG/ACT nasal spray, Place 2 sprays into both nostrils daily., Disp: 16 g, Rfl: 0   medroxyPROGESTERone (PROVERA) 10 MG tablet,  Take 1 tablet (10 mg total) by mouth at bedtime., Disp: 7 tablet, Rfl: 0   Multiple Vitamins-Minerals (MULTIVITAMIN ADULT PO), Take by mouth., Disp: , Rfl:    Norgestimate-Ethinyl Estradiol Triphasic (TRI FEMYNOR) 0.18/0.215/0.25 MG-35 MCG tablet, Take 1 tablet by mouth daily., Disp: 84 tablet, Rfl: 4   Norgestimate-Ethinyl Estradiol Triphasic 0.18/0.215/0.25 MG-35 MCG tablet, , Disp: , Rfl:    norgestrel-ethinyl estradiol (LO/OVRAL,CRYSELLE) 0.3-30 MG-MCG tablet, Take 1 tablet by mouth daily., Disp: , Rfl:    pantoprazole (PROTONIX) 40 MG tablet, pantoprazole 40 mg tablet,delayed release, Disp: , Rfl:    phentermine (ADIPEX-P) 37.5 MG tablet, phentermine 37.5  mg tablet, Disp: , Rfl:    phentermine 37.5 MG capsule, Take 1 capsule (37.5 mg total) by mouth every morning., Disp: 30 capsule, Rfl: 0   progesterone (PROMETRIUM) 100 MG capsule, Take 1 capsule by mouth  every day, Disp: 30 capsule, Rfl: 2   Semaglutide, 1 MG/DOSE, (OZEMPIC, 1 MG/DOSE,) 4 MG/3ML SOPN, Inject 1 mg into the skin once a week., Disp: 3 mL, Rfl: 0   Semaglutide,0.25 or 0.5MG /DOS, (OZEMPIC, 0.25 OR 0.5 MG/DOSE,) 2 MG/1.5ML SOPN, Inject 0.25 mg into the skin once a week., Disp: 0.75 mL, Rfl: 0   Semaglutide,0.25 or 0.5MG /DOS, (OZEMPIC, 0.25 OR 0.5 MG/DOSE,) 2 MG/1.5ML SOPN, Inject 0.5 mg into the skin once a week., Disp: 1.5 mL, Rfl: 0   Semaglutide-Weight Management (WEGOVY) 0.25 MG/0.5ML SOAJ, Inject 0.25 mg into the skin once a week for 4 weeks., Disp: 2 mL, Rfl: 0   Semaglutide-Weight Management (WEGOVY) 0.25 MG/0.5ML SOAJ, Inject 0.25 mg into the skin once a week., Disp: 2 mL, Rfl: 0   sucralfate (CARAFATE) 1 g tablet, Take 1 tablet (1 g total) by mouth 4 (four) times daily -  with meals and at bedtime., Disp: 60 tablet, Rfl: 0   tretinoin (RETIN-A) 0.025 % cream, tretinoin 0.025 % topical cream, Disp: , Rfl:    triamcinolone cream (KENALOG) 0.5 %, Apply 1 application topically to affected area 2 (two) times daily as needed. apply sparingly., Disp: 30 g, Rfl: 0  Allergies  Allergen Reactions   Citrus Hives, Rash and Other (See Comments)     ROS  As noted in HPI.   Physical Exam  BP 137/77 (BP Location: Left Arm)   Pulse 83   Temp 98.4 F (36.9 C) (Oral)   LMP 08/30/2021   SpO2 96%   Constitutional: Well developed, well nourished, no acute distress Eyes:  EOMI, conjunctiva normal bilaterally HENT: Normocephalic, atraumatic,mucus membranes moist. - nasal congestion + erythematous oropharynx - enlarged tonsils - exudates. Uvula midline.  No postnasal drip Respiratory: Normal inspiratory effort Cardiovascular: Normal rate, no murmurs, rubs, gallops GI: nondistended,  nontender. No appreciable splenomegaly skin: No rash, skin intact Lymph: + Anterior cervical LN.  No posterior cervical lymphadenopathy Musculoskeletal: no deformities Neurologic: Alert & oriented x 3, no focal neuro deficits Psychiatric: Speech and behavior appropriate.   ED Course   Medications - No data to display  Orders Placed This Encounter  Procedures   POCT rapid strep A    Standing Status:   Standing    Number of Occurrences:   1    Results for orders placed or performed during the hospital encounter of 08/05/22 (from the past 24 hour(s))  POCT rapid strep A     Status: Abnormal   Collection Time: 08/05/22  8:45 AM  Result Value Ref Range   Rapid Strep A Screen Positive (A) Negative   No results found.  ED Clinical Impression  1. Strep pharyngitis      ED Assessment/Plan  Rapid strep positive. Sending home with penicillin,  for 10 days. Home with  Tylenol, Benadryl/Maalox mixture. Patient to followup with PCP when necessary.  Discussed labs,  MDM, plan and followup with patient. Discussed sn/sx that should prompt return to the ED. patient agrees with plan.   Meds ordered this encounter  Medications   penicillin v potassium (VEETID) 500 MG tablet    Sig: Take 1 tablet (500 mg total) by mouth 2 (two) times daily for 10 days. X 10 days    Dispense:  20 tablet    Refill:  0     *This clinic note was created using [employer]. Therefore, there may be occasional mistakes despite careful proofreading.     Melynda Ripple, MD 08/05/22 [phone]

## 2022-08-15 DIAGNOSIS — N760 Acute vaginitis: Secondary | ICD-10-CM | POA: Diagnosis not present

## 2022-08-15 DIAGNOSIS — Z3A16 16 weeks gestation of pregnancy: Secondary | ICD-10-CM | POA: Diagnosis not present

## 2022-08-15 DIAGNOSIS — Z3482 Encounter for supervision of other normal pregnancy, second trimester: Secondary | ICD-10-CM | POA: Diagnosis not present

## 2022-08-16 ENCOUNTER — Other Ambulatory Visit (HOSPITAL_COMMUNITY): Payer: Self-pay

## 2022-08-16 MED ORDER — TERCONAZOLE 0.8 % VA CREA
1.0000 | TOPICAL_CREAM | Freq: Every day | VAGINAL | 0 refills | Status: DC
  Filled 2022-08-16: qty 20, 3d supply, fill #0

## 2022-08-17 DIAGNOSIS — D509 Iron deficiency anemia, unspecified: Secondary | ICD-10-CM | POA: Diagnosis not present

## 2022-08-17 DIAGNOSIS — Z0000 Encounter for general adult medical examination without abnormal findings: Secondary | ICD-10-CM | POA: Diagnosis not present

## 2022-08-17 DIAGNOSIS — Z9884 Bariatric surgery status: Secondary | ICD-10-CM | POA: Diagnosis not present

## 2022-08-17 DIAGNOSIS — F411 Generalized anxiety disorder: Secondary | ICD-10-CM | POA: Diagnosis not present

## 2022-08-17 DIAGNOSIS — K219 Gastro-esophageal reflux disease without esophagitis: Secondary | ICD-10-CM | POA: Diagnosis not present

## 2022-08-17 DIAGNOSIS — Z331 Pregnant state, incidental: Secondary | ICD-10-CM | POA: Diagnosis not present

## 2022-08-17 DIAGNOSIS — R7303 Prediabetes: Secondary | ICD-10-CM | POA: Diagnosis not present

## 2022-08-17 DIAGNOSIS — E282 Polycystic ovarian syndrome: Secondary | ICD-10-CM | POA: Diagnosis not present

## 2022-08-21 ENCOUNTER — Emergency Department (HOSPITAL_BASED_OUTPATIENT_CLINIC_OR_DEPARTMENT_OTHER)
Admission: EM | Admit: 2022-08-21 | Discharge: 2022-08-21 | Disposition: A | Discharge status: Home / Self Care | Payer: 59 | Attending: Emergency Medicine | Admitting: Emergency Medicine

## 2022-08-21 ENCOUNTER — Emergency Department (HOSPITAL_BASED_OUTPATIENT_CLINIC_OR_DEPARTMENT_OTHER): Payer: 59

## 2022-08-21 ENCOUNTER — Encounter (HOSPITAL_BASED_OUTPATIENT_CLINIC_OR_DEPARTMENT_OTHER): Payer: Self-pay

## 2022-08-21 ENCOUNTER — Other Ambulatory Visit: Payer: Self-pay

## 2022-08-21 DIAGNOSIS — O26892 Other specified pregnancy related conditions, second trimester: Secondary | ICD-10-CM | POA: Diagnosis present

## 2022-08-21 DIAGNOSIS — M79604 Pain in right leg: Secondary | ICD-10-CM | POA: Diagnosis not present

## 2022-08-21 DIAGNOSIS — M79661 Pain in right lower leg: Secondary | ICD-10-CM | POA: Diagnosis not present

## 2022-08-21 DIAGNOSIS — O2292 Venous complication in pregnancy, unspecified, second trimester: Secondary | ICD-10-CM | POA: Insufficient documentation

## 2022-08-21 DIAGNOSIS — Z3A19 19 weeks gestation of pregnancy: Secondary | ICD-10-CM | POA: Insufficient documentation

## 2022-08-21 DIAGNOSIS — M7989 Other specified soft tissue disorders: Secondary | ICD-10-CM | POA: Diagnosis not present

## 2022-08-21 DIAGNOSIS — I82812 Embolism and thrombosis of superficial veins of left lower extremities: Secondary | ICD-10-CM | POA: Diagnosis not present

## 2022-08-21 NOTE — ED Triage Notes (Signed)
Pt presents POV from UC to r/o possible DVT to her RLE. Pt reports having swelling and warm feeling to her RLE 5-6 weeks ago. Pt was then dx with a superficial clot to her Right anterior LE. They did not do an [place], dc'd her w/instructions to apply heat to area. Pt went to UC today d/t no relief.  Pt is [redacted] weeks pregnant, pt denies hx of blood clots

## 2022-08-21 NOTE — ED Notes (Signed)
Pt discharged to home, ambulatory, NAD noted.

## 2022-08-21 NOTE — ED Provider Notes (Signed)
[facility] EMERGENCY DEPT Provider Note   CSN: [id number] Arrival date & time: 08/21/22  1831     History  Chief Complaint  Patient presents with   Leg Swelling    Krystal Ferguson is a 31 y.o. female.  HPI   31 year old female [redacted] weeks pregnant presenting to the emergency department for evaluation for possible DVT.  The patient states that 5 to 6 weeks ago she had a swelling and warm feeling to her right lower extremity along the medial aspect along the calf and was diagnosed with a superficial thrombus to the right anterior lower extremity.  She was DC'd with instructions apply heat to the area.  She went to urgent care today due to new pain in the right lower extremity that was concerning for worsening clot.  She was sent here for further evaluation.  She denies any chest pain, shortness of breath.  No fevers.  She denies any other complaints at this time.  Home Medications Prior to Admission medications   Medication Sig Start Date End Date Taking? Authorizing Provider  benzonatate (TESSALON) 100 MG capsule Take 1-2 capsules (100-200 mg total) by mouth 3 (three) times daily as needed for cough. 12/25/18   Tenna Delaine D, PA-C  Cetirizine HCl (ZYRTEC ALLERGY) 10 MG CAPS Zyrtec 10 mg capsule  Take by oral route.    [provider]  Cholecalciferol 50 MCG (2000 UT) TABS Take by mouth.    [provider]  clomiPHENE (CLOMID) 50 MG tablet Take 1 tablet (50 mg total) by mouth daily on days 3 through 7. 04/26/22     COVID-19 At Home Antigen Test (CARESTART COVID-19 HOME TEST) KIT Use as directed within package instructions. 05/12/21   Jefm Bryant, RPH  COVID-19 At [facility] Antigen Test (QUICKVUE AT-HOME COVID-19 TEST) KIT Use as directed 08/16/21   Jefm Bryant, RPH  cyclobenzaprine (FLEXERIL) 5 MG tablet cyclobenzaprine 5 mg tablet    [provider]  fluticasone (FLONASE) 50 MCG/ACT nasal spray Place 2 sprays into both nostrils daily. 12/25/18    Tenna Delaine D, PA-C  medroxyPROGESTERone (PROVERA) 10 MG tablet Take 1 tablet (10 mg total) by mouth at bedtime. 12/08/21     Multiple Vitamins-Minerals (MULTIVITAMIN ADULT PO) Take by mouth.    [provider]  Norgestimate-Ethinyl Estradiol Triphasic (TRI FEMYNOR) 0.18/0.215/0.25 MG-35 MCG tablet Take 1 tablet by mouth daily. 06/26/21     Norgestimate-Ethinyl Estradiol Triphasic 0.18/0.215/0.25 MG-35 MCG tablet  01/05/20   [provider]  norgestrel-ethinyl estradiol (LO/OVRAL,CRYSELLE) 0.3-30 MG-MCG tablet Take 1 tablet by mouth daily.    [provider]  pantoprazole (PROTONIX) 40 MG tablet pantoprazole 40 mg tablet,delayed release    [provider]  phentermine (ADIPEX-P) 37.5 MG tablet phentermine 37.5 mg tablet    [provider]  phentermine 37.5 MG capsule Take 1 capsule (37.5 mg total) by mouth every morning. 05/10/21     Prenatal Vit-Fe Fumarate-FA (PRENATAL MULTIVITAMIN) TABS tablet Take 1 tablet by mouth daily at 12 noon.    [provider]  progesterone (PROMETRIUM) 100 MG capsule Take 1 capsule by mouth  every day 05/25/22     Semaglutide, 1 MG/DOSE, (OZEMPIC, 1 MG/DOSE,) 4 MG/3ML SOPN Inject 1 mg into the skin once a week. 08/09/21     Semaglutide,0.25 or 0.5MG /DOS, (OZEMPIC, 0.25 OR 0.5 MG/DOSE,) 2 MG/1.5ML SOPN Inject 0.25 mg into the skin once a week. 07/17/21     Semaglutide,0.25 or 0.5MG /DOS, (OZEMPIC, 0.25 OR 0.5 MG/DOSE,) 2  MG/1.5ML SOPN Inject 0.5 mg into the skin once a week. 07/18/21     Semaglutide-Weight Management (WEGOVY) 0.25 MG/0.5ML SOAJ Inject 0.25 mg into the skin once a week for 4 weeks. 04/20/21     Semaglutide-Weight Management (WEGOVY) 0.25 MG/0.5ML SOAJ Inject 0.25 mg into the skin once a week. 06/02/21     sucralfate (CARAFATE) 1 g tablet Take 1 tablet (1 g total) by mouth 4 (four) times daily -  with meals and at bedtime. 09/07/21   Muthersbaugh, Jarrett Soho, PA-C  terconazole (TERAZOL 3) 0.8 % vaginal cream Place 1  applicator vaginally daily for 3 days. 08/16/22     tretinoin (RETIN-A) 0.025 % cream tretinoin 0.025 % topical cream    [provider]  triamcinolone cream (KENALOG) 0.5 % Apply 1 application topically to affected area 2 (two) times daily as needed. apply sparingly. 04/11/21         Allergies    Citrus    Review of Systems   Review of Systems  All other systems reviewed and are negative.   Physical Exam Updated Vital Signs BP 134/83 (BP Location: Right Arm)   Pulse 78   Temp 98.2 F (36.8 C)   Resp 16   LMP 08/30/2021   SpO2 99%  Physical Exam Vitals and nursing note reviewed.  Constitutional:      General: She is not in acute distress. HENT:     Head: Normocephalic and atraumatic.  Eyes:     Conjunctiva/sclera: Conjunctivae normal.     Pupils: Pupils are equal, round, and reactive to light.  Cardiovascular:     Rate and Rhythm: Normal rate and regular rhythm.  Pulmonary:     Effort: Pulmonary effort is normal. No respiratory distress.  Abdominal:     General: There is no distension.     Tenderness: There is no guarding.     Comments: Abdomen gravid  Musculoskeletal:        General: No deformity or signs of injury.     Cervical back: Neck supple.     Comments: Right lower extremity with mild palpable superficial thrombus along the medial aspect, no significant erythema, no fluctuance, no crepitus, no significant warmth.  No evidence for cellulitis.  Skin:    Findings: No lesion or rash.  Neurological:     General: No focal deficit present.     Mental Status: She is alert. Mental status is at baseline.     ED Results / Procedures / Treatments   Labs (all labs ordered are listed, but only abnormal results are displayed) Labs Reviewed - No data to display  EKG None  Radiology US Venous Img Lower Unilateral Right  Result Date: 08/21/2022 CLINICAL DATA:  Right lower extremity pain and swelling for several weeks. EXAM: Right LOWER EXTREMITY VENOUS  DOPPLER ULTRASOUND TECHNIQUE: Gray-scale sonography with compression, as well as color and duplex ultrasound, were performed to evaluate the deep venous system(s) from the level of the common femoral vein through the popliteal and proximal calf veins. COMPARISON:  None Available. FINDINGS: VENOUS Normal compressibility of the common femoral, superficial femoral, and popliteal veins, as well as the visualized calf veins. Visualized portions of profunda femoral vein and great saphenous vein unremarkable. No filling defects to suggest DVT on grayscale or color Doppler imaging. Doppler waveforms show normal direction of venous flow, normal respiratory plasticity and response to augmentation. Limited views of the contralateral common femoral vein are unremarkable. OTHER None. Limitations: none IMPRESSION: Negative. Electronically Signed   By:  Marijo Conception M.D.   On: 08/21/2022 21:03    Procedures Procedures    Medications Ordered in ED Medications - No data to display  ED Course/ Medical Decision Making/ A&P                           Medical Decision Making    31 year old female [redacted] weeks pregnant presenting to the emergency department for evaluation for possible DVT.  The patient states that 5 to 6 weeks ago she had a swelling and warm feeling to her right lower extremity along the medial aspect along the calf and was diagnosed with a superficial thrombus to the right anterior lower extremity.  She was DC'd with instructions apply heat to the area.  She went to urgent care today due to new pain in the right lower extremity that was concerning for worsening clot.  She was sent here for further evaluation.  She denies any chest pain, shortness of breath.  No fevers.  She denies any other complaints at this time.  Arrival, the patient was vitally stable.  She presents with no concerning findings for PE.  Ultrasound of the lower extremity revealed no evidence of DVT or other acute abnormality.  Low  suspicion for cellulitis at this time.  Patient with mild palpable residual superficial thrombus with no evidence of thrombophlebitis.  Advised continued warm compression to the affected area and Tylenol for pain control.  Overall stable for discharge and PCP follow-up.  Final Clinical Impression(s) / ED Diagnoses Final diagnoses:  Superficial thrombosis of leg, left    Rx / DC Orders ED Discharge Orders     None         Regan Lemming, MD 08/21/22 2157

## 2022-08-21 NOTE — Discharge Instructions (Addendum)
There is no evidence for cellulitis of your lower extremity.  Your DVT study was negative. Recommend outpatient PCP follow-up.   Your [place] results: FINDINGS:  VENOUS    Normal compressibility of the common femoral, superficial femoral,  and popliteal veins, as well as the visualized calf veins.  Visualized portions of profunda femoral vein and great saphenous  vein unremarkable. No filling defects to suggest DVT on grayscale or  color Doppler imaging. Doppler waveforms show normal direction of  venous flow, normal respiratory plasticity and response to  augmentation.    Limited views of the contralateral common femoral vein are  unremarkable.    OTHER    None.    Limitations: none    IMPRESSION:  Negative.

## 2022-09-06 DIAGNOSIS — Z3A19 19 weeks gestation of pregnancy: Secondary | ICD-10-CM | POA: Diagnosis not present

## 2022-09-06 DIAGNOSIS — Z3482 Encounter for supervision of other normal pregnancy, second trimester: Secondary | ICD-10-CM | POA: Diagnosis not present

## 2022-09-06 DIAGNOSIS — Z363 Encounter for antenatal screening for malformations: Secondary | ICD-10-CM | POA: Diagnosis not present

## 2022-09-18 ENCOUNTER — Other Ambulatory Visit (HOSPITAL_COMMUNITY): Payer: Self-pay

## 2022-09-18 DIAGNOSIS — R102 Pelvic and perineal pain: Secondary | ICD-10-CM | POA: Diagnosis not present

## 2022-09-18 MED ORDER — AMPICILLIN 500 MG PO CAPS
500.0000 mg | ORAL_CAPSULE | Freq: Four times a day (QID) | ORAL | 0 refills | Status: DC
  Filled 2022-09-18: qty 12, 3d supply, fill #0

## 2022-10-10 DIAGNOSIS — O99212 Obesity complicating pregnancy, second trimester: Secondary | ICD-10-CM | POA: Diagnosis not present

## 2022-10-10 DIAGNOSIS — Z362 Encounter for other antenatal screening follow-up: Secondary | ICD-10-CM | POA: Diagnosis not present

## 2022-10-10 DIAGNOSIS — Z3482 Encounter for supervision of other normal pregnancy, second trimester: Secondary | ICD-10-CM | POA: Diagnosis not present

## 2022-10-10 DIAGNOSIS — Z3A24 24 weeks gestation of pregnancy: Secondary | ICD-10-CM | POA: Diagnosis not present

## 2022-10-11 ENCOUNTER — Other Ambulatory Visit (HOSPITAL_COMMUNITY): Payer: Self-pay

## 2022-10-11 DIAGNOSIS — J069 Acute upper respiratory infection, unspecified: Secondary | ICD-10-CM | POA: Diagnosis not present

## 2022-10-11 MED ORDER — CITALOPRAM HYDROBROMIDE 10 MG PO TABS
10.0000 mg | ORAL_TABLET | Freq: Every day | ORAL | 5 refills | Status: DC
  Filled 2022-10-11 (×2): qty 30, 30d supply, fill #0

## 2022-10-17 ENCOUNTER — Other Ambulatory Visit: Payer: Self-pay | Admitting: Obstetrics and Gynecology

## 2022-10-17 DIAGNOSIS — Z3482 Encounter for supervision of other normal pregnancy, second trimester: Secondary | ICD-10-CM

## 2022-10-17 DIAGNOSIS — Z363 Encounter for antenatal screening for malformations: Secondary | ICD-10-CM

## 2022-10-22 ENCOUNTER — Other Ambulatory Visit (HOSPITAL_COMMUNITY): Payer: Self-pay

## 2022-10-24 ENCOUNTER — Other Ambulatory Visit (HOSPITAL_COMMUNITY): Payer: Self-pay

## 2022-10-24 MED ORDER — TERCONAZOLE 0.8 % VA CREA
1.0000 | TOPICAL_CREAM | Freq: Every day | VAGINAL | 0 refills | Status: DC
  Filled 2022-10-24: qty 20, 3d supply, fill #0

## 2022-10-30 ENCOUNTER — Other Ambulatory Visit (HOSPITAL_COMMUNITY): Payer: Self-pay

## 2022-10-30 MED ORDER — NITROFURANTOIN MONOHYD MACRO 100 MG PO CAPS
100.0000 mg | ORAL_CAPSULE | Freq: Two times a day (BID) | ORAL | 0 refills | Status: DC
  Filled 2022-10-30: qty 14, 7d supply, fill #0

## 2022-11-02 ENCOUNTER — Encounter: Payer: Self-pay | Admitting: *Deleted

## 2022-11-02 DIAGNOSIS — Z3400 Encounter for supervision of normal first pregnancy, unspecified trimester: Secondary | ICD-10-CM | POA: Diagnosis not present

## 2022-11-02 DIAGNOSIS — Z3480 Encounter for supervision of other normal pregnancy, unspecified trimester: Secondary | ICD-10-CM | POA: Diagnosis not present

## 2022-11-02 DIAGNOSIS — Z23 Encounter for immunization: Secondary | ICD-10-CM | POA: Diagnosis not present

## 2022-11-02 DIAGNOSIS — Z3A27 27 weeks gestation of pregnancy: Secondary | ICD-10-CM | POA: Diagnosis not present

## 2022-11-14 ENCOUNTER — Encounter: Payer: Self-pay | Admitting: *Deleted

## 2022-11-14 ENCOUNTER — Ambulatory Visit: Payer: 59 | Admitting: *Deleted

## 2022-11-14 ENCOUNTER — Ambulatory Visit: Payer: 59 | Attending: Obstetrics and Gynecology

## 2022-11-14 VITALS — BP 111/57 | HR 101

## 2022-11-14 DIAGNOSIS — Z3482 Encounter for supervision of other normal pregnancy, second trimester: Secondary | ICD-10-CM | POA: Diagnosis present

## 2022-11-14 DIAGNOSIS — O99213 Obesity complicating pregnancy, third trimester: Secondary | ICD-10-CM | POA: Insufficient documentation

## 2022-11-14 DIAGNOSIS — Z3A29 29 weeks gestation of pregnancy: Secondary | ICD-10-CM | POA: Diagnosis not present

## 2022-11-14 DIAGNOSIS — Z363 Encounter for antenatal screening for malformations: Secondary | ICD-10-CM | POA: Diagnosis not present

## 2022-11-14 DIAGNOSIS — Z3689 Encounter for other specified antenatal screening: Secondary | ICD-10-CM

## 2022-11-14 DIAGNOSIS — E669 Obesity, unspecified: Secondary | ICD-10-CM

## 2022-11-16 ENCOUNTER — Other Ambulatory Visit: Payer: Self-pay | Admitting: *Deleted

## 2022-11-16 DIAGNOSIS — Z362 Encounter for other antenatal screening follow-up: Secondary | ICD-10-CM

## 2022-11-16 DIAGNOSIS — O99213 Obesity complicating pregnancy, third trimester: Secondary | ICD-10-CM

## 2022-11-18 ENCOUNTER — Ambulatory Visit (HOSPITAL_COMMUNITY)
Admission: EM | Admit: 2022-11-18 | Discharge: 2022-11-18 | Disposition: A | Discharge status: Home / Self Care | Payer: 59

## 2022-11-18 ENCOUNTER — Inpatient Hospital Stay (HOSPITAL_COMMUNITY)
Admission: AD | Admit: 2022-11-18 | Discharge: 2022-11-18 | Disposition: A | Discharge status: Home / Self Care | Payer: 59 | Attending: Obstetrics and Gynecology | Admitting: Obstetrics and Gynecology

## 2022-11-18 ENCOUNTER — Inpatient Hospital Stay (HOSPITAL_COMMUNITY): Payer: 59

## 2022-11-18 ENCOUNTER — Encounter (HOSPITAL_COMMUNITY): Payer: Self-pay | Admitting: Obstetrics and Gynecology

## 2022-11-18 DIAGNOSIS — O26899 Other specified pregnancy related conditions, unspecified trimester: Secondary | ICD-10-CM

## 2022-11-18 DIAGNOSIS — O368130 Decreased fetal movements, third trimester, not applicable or unspecified: Secondary | ICD-10-CM | POA: Insufficient documentation

## 2022-11-18 DIAGNOSIS — O99353 Diseases of the nervous system complicating pregnancy, third trimester: Secondary | ICD-10-CM | POA: Insufficient documentation

## 2022-11-18 DIAGNOSIS — Z20822 Contact with and (suspected) exposure to covid-19: Secondary | ICD-10-CM | POA: Diagnosis not present

## 2022-11-18 DIAGNOSIS — Z3A29 29 weeks gestation of pregnancy: Secondary | ICD-10-CM | POA: Diagnosis not present

## 2022-11-18 DIAGNOSIS — R519 Headache, unspecified: Secondary | ICD-10-CM | POA: Insufficient documentation

## 2022-11-18 DIAGNOSIS — J069 Acute upper respiratory infection, unspecified: Secondary | ICD-10-CM

## 2022-11-18 DIAGNOSIS — Z1152 Encounter for screening for COVID-19: Secondary | ICD-10-CM | POA: Diagnosis not present

## 2022-11-18 DIAGNOSIS — R0602 Shortness of breath: Secondary | ICD-10-CM | POA: Diagnosis not present

## 2022-11-18 LAB — URINALYSIS, ROUTINE W REFLEX MICROSCOPIC
Bilirubin Urine: NEGATIVE
Glucose, UA: 150 mg/dL — AB
Hgb urine dipstick: NEGATIVE
Ketones, ur: NEGATIVE mg/dL
Leukocytes,Ua: NEGATIVE
Nitrite: NEGATIVE
Protein, ur: NEGATIVE mg/dL
Specific Gravity, Urine: 1.011 (ref 1.005–1.030)
pH: 6 (ref 5.0–8.0)

## 2022-11-18 LAB — RESP PANEL BY RT-PCR (FLU A&B, COVID) ARPGX2
Influenza A by PCR: NEGATIVE
Influenza B by PCR: NEGATIVE
SARS Coronavirus 2 by RT PCR: NEGATIVE

## 2022-11-18 MED ORDER — ACETAMINOPHEN 500 MG PO TABS
1000.0000 mg | ORAL_TABLET | Freq: Once | ORAL | Status: AC
  Administered 2022-11-18: 1000 mg via ORAL
  Filled 2022-11-18: qty 2

## 2022-11-18 NOTE — MAU Note (Signed)
.  Krystal Ferguson is a 31 y.o. at 73w4dhere in MAU reporting: she has not felt the baby move since last pm. Started with a sore throat last pm, and woke up in the middle of the night with shortness of breath, a headache and nasal congestion. Denies vaginal bleeding or ROM  Onset of complaint: last pm Pain score: 6/10 headache Vitals:   11/18/22 1106  BP: (!) 105/57  Pulse: (!) 104  Resp: 18  Temp: 98.3 F (36.8 C)  SpO2: 98%     FHT:144 Lab orders placed from triage:  ua

## 2022-11-18 NOTE — ED Provider Notes (Signed)
Patient seen briefly in triage. Reports URI symptoms yesterday, and overnight developed shortness of breath.  She is [redacted] weeks pregnant, and states that the baby is moving some this morning but not as much as usual.  Her heart rate is 110, blood pressure is 113/59  Lungs are clear and she is in no acute distress.  Since the baby is not moving quite as much as usual and she is a little tachycardic, I have asked her to proceed to the maternal admissions unit for higher level of care and evaluation then we can accomplish here for her in the urgent care.   Barrett Henle, MD 11/18/22 1032

## 2022-11-18 NOTE — MAU Provider Note (Signed)
History     CSN: [id number]  Arrival date and time: 11/18/22 1037   None     Chief Complaint  Patient presents with   Decreased Fetal Movement   HPI  Ms.Krystal Ferguson is a 31 y.o. female 14w4dhere with SOB, Sore throat, congestion, and DFM. The symptoms started at 0300 yesterday. She was exposed to covid last Monday. Her 5 year has some mild symptoms; no fever.  SOB is not a new symptom, she experienced this in the first trimester as well and it subsited. She is taking mucinex for symptoms. She is eating and drinking well.   Since arrival to MAU she is now feeling fetal movements.   OB History     Gravida  3   Para  1   Term  1   Preterm      AB  1   Living  1      SAB  1   IAB      Ectopic      Multiple  0   Live Births  1           Past Medical History:  Diagnosis Date   Chest pain    Migraine    Polycystic ovaries    Vaginal Pap smear, abnormal     Past Surgical History:  Procedure Laterality Date   CESAREAN SECTION N/A 05/09/2017   Procedure: CESAREAN SECTION;  Surgeon: LLouretta Shorten MD;  Location: [facility]  Service: Obstetrics;  Laterality: N/A;   LAPAROSCOPIC GASTRIC SLEEVE RESECTION     TPN x5 weeks   WISDOM TOOTH EXTRACTION      Family History  Problem Relation Age of Onset   Bladder Cancer Mother    Thyroid disease Mother    Kidney disease Father    Heart disease Maternal Grandmother    Thyroid disease Maternal Grandmother    Hypertension Paternal Grandmother    Breast cancer Paternal Grandmother    Thyroid disease Maternal Aunt    Heart disease Paternal Grandfather    Diabetes Paternal Grandfather    Lung cancer Paternal Grandfather     Social History   Tobacco Use   Smoking status: Never    Passive exposure: Never   Smokeless tobacco: Never  Vaping Use   Vaping Use: Never used  Substance Use Topics   Alcohol use: Not Currently    Comment: occassional   Drug use: No    Allergies:  Allergies   Allergen Reactions   Citrus Hives, Rash and Other (See Comments)    Medications Prior to Admission  Medication Sig Dispense Refill Last Dose   Prenatal Vit-Fe Fumarate-FA (PRENATAL MULTIVITAMIN) TABS tablet Take 1 tablet by mouth daily at 12 noon.   11/17/2022   benzonatate (TESSALON) 100 MG capsule Take 1-2 capsules (100-200 mg total) by mouth 3 (three) times daily as needed for cough. 40 capsule 0    Cetirizine HCl (ZYRTEC ALLERGY) 10 MG CAPS Zyrtec 10 mg capsule  Take by oral route.      citalopram (CELEXA) 10 MG tablet Take 1 tablet (10 mg total) by mouth daily. 30 tablet 5    cyclobenzaprine (FLEXERIL) 5 MG tablet cyclobenzaprine 5 mg tablet      fluticasone (FLONASE) 50 MCG/ACT nasal spray Place 2 sprays into both nostrils daily. 16 g 0    norgestrel-ethinyl estradiol (LO/OVRAL,CRYSELLE) 0.3-30 MG-MCG tablet Take 1 tablet by mouth daily.      pantoprazole (PROTONIX) 40 MG tablet pantoprazole 40 mg tablet,delayed  release      tretinoin (RETIN-A) 0.025 % cream tretinoin 0.025 % topical cream      Results for orders placed or performed during the hospital encounter of 11/18/22 (from the past 24 hour(s))  Urinalysis, Routine w reflex microscopic Urine, Clean Catch     Status: Abnormal   Collection Time: 11/18/22 11:21 AM  Result Value Ref Range   Color, Urine YELLOW YELLOW   APPearance HAZY (A) CLEAR   Specific Gravity, Urine 1.011 1.005 - 1.030   pH 6.0 5.0 - 8.0   Glucose, UA 150 (A) NEGATIVE mg/dL   Hgb urine dipstick NEGATIVE NEGATIVE   Bilirubin Urine NEGATIVE NEGATIVE   Ketones, ur NEGATIVE NEGATIVE mg/dL   Protein, ur NEGATIVE NEGATIVE mg/dL   Nitrite NEGATIVE NEGATIVE   Leukocytes,Ua NEGATIVE NEGATIVE  Resp Panel by RT-PCR (Flu A&B, Covid) Anterior Nasal Swab     Status: None   Collection Time: 11/18/22 11:26 AM   Specimen: Anterior Nasal Swab  Result Value Ref Range   SARS Coronavirus 2 by RT PCR NEGATIVE NEGATIVE   Influenza A by PCR NEGATIVE NEGATIVE   Influenza B  by PCR NEGATIVE NEGATIVE    DG CHEST PORT 1 VIEW  Result Date: 11/18/2022 CLINICAL DATA:  Shortness of breath. EXAM: PORTABLE CHEST 1 VIEW COMPARISON:  09/06/2021 FINDINGS: The heart size and mediastinal contours are within normal limits. Both lungs are clear. The visualized skeletal structures are unremarkable. IMPRESSION: No active disease. Electronically Signed   By: Marlaine Hind M.D.   On: 11/18/2022 12:32     Review of Systems  Constitutional:  Negative for fatigue and fever.  Respiratory:  Positive for cough and shortness of breath. Negative for wheezing.   Gastrointestinal:  Negative for abdominal pain.   Physical Exam   Blood pressure (!) 105/57, pulse (!) 104, temperature 98.3 F (36.8 C), temperature source Oral, resp. rate 18, height '5\' 3"'$  (1.6 m), weight 108.4 kg, last menstrual period 04/25/2022, SpO2 98 %, unknown if currently breastfeeding.  Physical Exam Vitals and nursing note reviewed.  Constitutional:      General: She is not in acute distress.    Appearance: Normal appearance. She is not ill-appearing, toxic-appearing or diaphoretic.  Cardiovascular:     Rate and Rhythm: Normal rate.  Pulmonary:     Effort: Pulmonary effort is normal.     Breath sounds: Normal breath sounds.  Skin:    General: Skin is warm.  Neurological:     Mental Status: She is alert and oriented to person, place, and time.  Psychiatric:        Behavior: Behavior normal.    Fetal Tracing: Baseline: 145 bpm Variability: Moderate  Accelerations: 10x10 Decelerations: None Toco: None  MAU Course  Procedures None  MDM  Covid, flu negative Chest X ray negative Tylenol given 1 gram.   Assessment and Plan   A:  1. Upper respiratory virus   2. [redacted] weeks gestation of pregnancy   3. Pregnancy headache, antepartum      P:  Discharge home Return to MAU if symptoms worsen Ok to use tylenol as directed on the bottle. Rest Increase oral fluids.   Noni Saupe I,  NP 11/18/2022 6:08 PM

## 2022-11-18 NOTE — Discharge Instructions (Signed)

## 2022-11-18 NOTE — ED Notes (Signed)
Patient is being discharged from the Urgent Care and sent to the Emergency Department via private vehicle with family . Per Dr Windy Carina, patient is in need of higher level of care due to [redacted] wk gestation with dyspnea and report of slightly decreased fetal movement. Patient is aware and verbalizes understanding of plan of care.  Vitals:   11/18/22 1025  BP: (!) 113/59  Pulse: (!) 110  Resp: (!) 24  Temp: 97.7 F (36.5 C)  SpO2: 98%

## 2022-11-18 NOTE — ED Triage Notes (Signed)
C/O yesterday starting with congestion and sore throat. During night woke up with [facility] which has continued; also having HA now. Pt states she is [redacted] wks gestation. States she is feeling positive fetal movement "but not as much as usual".

## 2022-11-18 NOTE — ED Notes (Signed)
Triage eval per Dr. Windy Carina

## 2022-11-28 DIAGNOSIS — Z3483 Encounter for supervision of other normal pregnancy, third trimester: Secondary | ICD-10-CM | POA: Diagnosis not present

## 2022-11-28 DIAGNOSIS — Z3A31 31 weeks gestation of pregnancy: Secondary | ICD-10-CM | POA: Diagnosis not present

## 2022-11-28 DIAGNOSIS — O368130 Decreased fetal movements, third trimester, not applicable or unspecified: Secondary | ICD-10-CM | POA: Diagnosis not present

## 2022-12-11 DIAGNOSIS — O99213 Obesity complicating pregnancy, third trimester: Secondary | ICD-10-CM | POA: Diagnosis not present

## 2022-12-11 DIAGNOSIS — Z3400 Encounter for supervision of normal first pregnancy, unspecified trimester: Secondary | ICD-10-CM | POA: Diagnosis not present

## 2022-12-11 DIAGNOSIS — Z3A32 32 weeks gestation of pregnancy: Secondary | ICD-10-CM | POA: Diagnosis not present

## 2022-12-18 ENCOUNTER — Ambulatory Visit (HOSPITAL_COMMUNITY)
Admission: RE | Admit: 2022-12-18 | Discharge: 2022-12-18 | Disposition: A | Discharge status: Home / Self Care | Payer: 59 | Source: Ambulatory Visit | Attending: Vascular Surgery | Admitting: Vascular Surgery

## 2022-12-18 ENCOUNTER — Other Ambulatory Visit (HOSPITAL_COMMUNITY): Payer: Self-pay | Admitting: Obstetrics & Gynecology

## 2022-12-18 DIAGNOSIS — M79662 Pain in left lower leg: Secondary | ICD-10-CM | POA: Insufficient documentation

## 2022-12-20 ENCOUNTER — Ambulatory Visit: Payer: Self-pay

## 2022-12-20 ENCOUNTER — Ambulatory Visit: Payer: Commercial Managed Care - PPO

## 2023-01-01 DIAGNOSIS — Z369 Encounter for antenatal screening, unspecified: Secondary | ICD-10-CM | POA: Diagnosis not present

## 2023-01-01 LAB — OB RESULTS CONSOLE GBS: GBS: NEGATIVE

## 2023-01-10 ENCOUNTER — Telehealth (HOSPITAL_COMMUNITY): Payer: Self-pay | Admitting: *Deleted

## 2023-01-10 ENCOUNTER — Encounter (HOSPITAL_COMMUNITY): Payer: Self-pay

## 2023-01-10 NOTE — Telephone Encounter (Signed)
Preadmission screen  

## 2023-01-10 NOTE — Patient Instructions (Addendum)
Krystal Ferguson  01/10/2023   Your procedure is scheduled on:  01/24/2022  Arrive at [place] at Entrance C on [address] at [facility]  and [address]. You are invited to use the FREE valet parking or use the Visitor's parking deck.  Pick up the phone at the desk and dial [phone].  Call this number if you have problems the morning of surgery: [phone]  Remember:   Do not eat food:(After Midnight) Desps de medianoche.  Do not drink clear liquids: (After Midnight) Desps de medianoche.  Take these medicines the morning of surgery with A SIP OF WATER:  none   Do not wear jewelry, make-up or nail polish.  Do not wear lotions, powders, or perfumes. Do not wear deodorant.  Do not shave 48 hours prior to surgery.  Do not bring valuables to the hospital.  [facility] is not   responsible for any belongings or valuables brought to the hospital.  Contacts, dentures or bridgework may not be worn into surgery.  Leave suitcase in the car. After surgery it may be brought to your room.  For patients admitted to the hospital, checkout time is 11:00 AM the day of              discharge.      Please read over the following fact sheets that you were given:     Preparing for Surgery

## 2023-01-11 ENCOUNTER — Telehealth (HOSPITAL_COMMUNITY): Payer: Self-pay | Admitting: *Deleted

## 2023-01-11 DIAGNOSIS — Z3483 Encounter for supervision of other normal pregnancy, third trimester: Secondary | ICD-10-CM | POA: Diagnosis not present

## 2023-01-11 DIAGNOSIS — Z3482 Encounter for supervision of other normal pregnancy, second trimester: Secondary | ICD-10-CM | POA: Diagnosis not present

## 2023-01-11 NOTE — Telephone Encounter (Signed)
Preadmission screen  

## 2023-01-14 ENCOUNTER — Encounter (HOSPITAL_COMMUNITY): Payer: Self-pay

## 2023-01-22 ENCOUNTER — Other Ambulatory Visit (HOSPITAL_COMMUNITY)
Admission: RE | Admit: 2023-01-22 | Discharge: 2023-01-22 | Disposition: A | Discharge status: Home / Self Care | Payer: 59 | Source: Ambulatory Visit | Attending: Obstetrics and Gynecology | Admitting: Obstetrics and Gynecology

## 2023-01-22 DIAGNOSIS — Z01812 Encounter for preprocedural laboratory examination: Secondary | ICD-10-CM | POA: Insufficient documentation

## 2023-01-22 HISTORY — DX: Family history of other specified conditions: Z84.89

## 2023-01-22 LAB — CBC
HCT: 36.6 % (ref 36.0–46.0)
Hemoglobin: 11.6 g/dL — ABNORMAL LOW (ref 12.0–15.0)
MCH: 26.3 pg (ref 26.0–34.0)
MCHC: 31.7 g/dL (ref 30.0–36.0)
MCV: 83 fL (ref 80.0–100.0)
Platelets: 195 10*3/uL (ref 150–400)
RBC: 4.41 MIL/uL (ref 3.87–5.11)
RDW: 14.4 % (ref 11.5–15.5)
WBC: 10.1 10*3/uL (ref 4.0–10.5)
nRBC: 0 % (ref 0.0–0.2)

## 2023-01-22 LAB — TYPE AND SCREEN
ABO/RH(D): O POS
Antibody Screen: NEGATIVE

## 2023-01-22 LAB — RPR: RPR Ser Ql: NONREACTIVE

## 2023-01-22 NOTE — H&P (Signed)
Krystal Ferguson is a 32 y.o. female presenting for scheduled RCS with BTL.History of CS for arrest of dilation (7#13). She has PCOS and conceived with clomid. She has a history of gastric sleeve.  History anemia. Pregnancy otherwise uncomplicated. Last [place] with back down transverse.    OB History     Gravida  3   Para  1   Term  1   Preterm      AB  1   Living  1      SAB  1   IAB      Ectopic      Multiple  0   Live Births  1          Past Medical History:  Diagnosis Date   Chest pain    Family history of adverse reaction to anesthesia    mom nausea and vomitting   Migraine    Polycystic ovaries    Vaginal Pap smear, abnormal    Past Surgical History:  Procedure Laterality Date   CESAREAN SECTION N/A 05/09/2017   Procedure: CESAREAN SECTION;  Surgeon: Louretta Shorten, MD;  Location: [place];  Service: Obstetrics;  Laterality: N/A;   COLPOSCOPY     LAPAROSCOPIC GASTRIC SLEEVE RESECTION     TPN x5 weeks   WISDOM TOOTH EXTRACTION     Family History: family history includes Bladder Cancer in her mother; Breast cancer in her paternal grandmother; Diabetes in her paternal grandfather; Heart disease in her maternal grandmother and paternal grandfather; Hypertension in her paternal grandmother; Kidney disease in her father; Lung cancer in her paternal grandfather; Thyroid disease in her maternal aunt, maternal grandmother, and mother. Social History:  reports that she has never smoked. She has never been exposed to tobacco smoke. She has never used smokeless tobacco. She reports that she does not currently use alcohol. She reports that she does not use drugs.     Maternal Diabetes: No Genetic Screening: Normal RR GIRL Maternal Ultrasounds/Referrals: Normal Fetal Ultrasounds or other Referrals:  None Maternal Substance Abuse:  No Significant Maternal Medications:  None Significant Maternal Lab Results:  Group B Strep negative Number of Prenatal  Visits:greater than 3 verified prenatal visits Other Comments:  None  Review of Systems History   Last menstrual period 04/25/2022, unknown if currently breastfeeding. Exam Physical Exam  (from office) NAD, A&O NWOB Abd soft, nondistended, gravid  Prenatal labs: ABO, Rh: O/Positive/-- (07/06 0000) Antibody: Negative (07/06 0000) Rubella: Immune (07/06 0000) RPR: Nonreactive (07/06 0000)  HBsAg: Negative (07/06 0000)  HIV: Non-reactive (07/06 0000)  GBS: Negative/-- (01/16 0000)   Assessment/Plan: 32 yo presenting for scheduled repeat CS with requested bilateral tubal ligation. Risks discussed including infection, bleeding, damage to surrounding structures, the need for additional procedures including hysterectomy, and the possibility of uterine rupture with neonatal morbidity/mortality, scarring, and abnormal placentation with subsequent pregnancies. We also discussed bilateral tubal ligation in detail. The alternatives to permanent sterilization were reviewed and it was discussed that this is considered permanent. We reviewed the risks in detail including regret, failure and ectopic pregnancy. She understands and agrees to proceed. 2g cefotetan call to OR.     Tyson Dense 01/22/2023, 9:03 AM   Risks and benefits of C/S were discussed.  All questions were answered and informed consent was obtained.  Plan to proceed with low segment transverse Cesarean Section.This patient has been seen and examined.   All of her questions were answered.  Labs and vital signs reviewed.  Informed  consent has been obtained.  The History and Physical is current.This patient has been seen and examined.   All of her questions were answered.  Labs and vital signs reviewed.  Informed consent has been obtained.  The History and Physical is current. DL

## 2023-01-24 ENCOUNTER — Inpatient Hospital Stay (HOSPITAL_COMMUNITY): Payer: 59 | Admitting: Anesthesiology

## 2023-01-24 ENCOUNTER — Other Ambulatory Visit: Payer: Self-pay

## 2023-01-24 ENCOUNTER — Inpatient Hospital Stay (HOSPITAL_COMMUNITY)
Admission: RE | Admit: 2023-01-24 | Discharge: 2023-01-26 | DRG: 784 | Disposition: A | Discharge status: Home / Self Care | Payer: 59 | Attending: Obstetrics and Gynecology | Admitting: Obstetrics and Gynecology

## 2023-01-24 ENCOUNTER — Encounter (HOSPITAL_COMMUNITY)
Admission: RE | Disposition: A | Discharge status: Home / Self Care | Payer: Self-pay | Source: Home / Self Care | Attending: Obstetrics and Gynecology

## 2023-01-24 ENCOUNTER — Encounter (HOSPITAL_COMMUNITY): Payer: Self-pay | Admitting: Obstetrics and Gynecology

## 2023-01-24 DIAGNOSIS — O99284 Endocrine, nutritional and metabolic diseases complicating childbirth: Secondary | ICD-10-CM | POA: Diagnosis present

## 2023-01-24 DIAGNOSIS — Z302 Encounter for sterilization: Secondary | ICD-10-CM

## 2023-01-24 DIAGNOSIS — E282 Polycystic ovarian syndrome: Secondary | ICD-10-CM | POA: Diagnosis present

## 2023-01-24 DIAGNOSIS — O99214 Obesity complicating childbirth: Secondary | ICD-10-CM

## 2023-01-24 DIAGNOSIS — Z3A39 39 weeks gestation of pregnancy: Secondary | ICD-10-CM

## 2023-01-24 DIAGNOSIS — O34211 Maternal care for low transverse scar from previous cesarean delivery: Secondary | ICD-10-CM

## 2023-01-24 DIAGNOSIS — D62 Acute posthemorrhagic anemia: Secondary | ICD-10-CM | POA: Diagnosis not present

## 2023-01-24 DIAGNOSIS — Z3A00 Weeks of gestation of pregnancy not specified: Secondary | ICD-10-CM | POA: Diagnosis not present

## 2023-01-24 DIAGNOSIS — O99844 Bariatric surgery status complicating childbirth: Secondary | ICD-10-CM | POA: Diagnosis not present

## 2023-01-24 DIAGNOSIS — Z3490 Encounter for supervision of normal pregnancy, unspecified, unspecified trimester: Principal | ICD-10-CM

## 2023-01-24 DIAGNOSIS — O9902 Anemia complicating childbirth: Secondary | ICD-10-CM | POA: Diagnosis present

## 2023-01-24 DIAGNOSIS — O34219 Maternal care for unspecified type scar from previous cesarean delivery: Secondary | ICD-10-CM | POA: Diagnosis not present

## 2023-01-24 DIAGNOSIS — E669 Obesity, unspecified: Secondary | ICD-10-CM

## 2023-01-24 SURGERY — Surgical Case
Anesthesia: Spinal

## 2023-01-24 MED ORDER — DEXAMETHASONE SODIUM PHOSPHATE 10 MG/ML IJ SOLN
INTRAMUSCULAR | Status: DC | PRN
  Administered 2023-01-24: 10 mg via INTRAVENOUS

## 2023-01-24 MED ORDER — PRENATAL MULTIVITAMIN CH
1.0000 | ORAL_TABLET | Freq: Every day | ORAL | Status: DC
  Administered 2023-01-25 – 2023-01-26 (×2): 1 via ORAL
  Filled 2023-01-24 (×3): qty 1

## 2023-01-24 MED ORDER — KETOROLAC TROMETHAMINE 30 MG/ML IJ SOLN: 30.0000 mg | Freq: Four times a day (QID) | INTRAMUSCULAR | Status: AC | PRN

## 2023-01-24 MED ORDER — SCOPOLAMINE 1 MG/3DAYS TD PT72: 1.0000 | MEDICATED_PATCH | Freq: Once | TRANSDERMAL | Status: DC

## 2023-01-24 MED ORDER — TETANUS-DIPHTH-ACELL PERTUSSIS 5-2.5-18.5 LF-MCG/0.5 IM SUSY: 0.5000 mL | PREFILLED_SYRINGE | Freq: Once | INTRAMUSCULAR | Status: DC

## 2023-01-24 MED ORDER — LACTATED RINGERS IV SOLN: INTRAVENOUS | Status: DC

## 2023-01-24 MED ORDER — COCONUT OIL OIL: 1.0000 | TOPICAL_OIL | Status: DC | PRN

## 2023-01-24 MED ORDER — ACETAMINOPHEN 10 MG/ML IV SOLN
INTRAVENOUS | Status: AC
  Filled 2023-01-24: qty 100

## 2023-01-24 MED ORDER — ZOLPIDEM TARTRATE 5 MG PO TABS: 5.0000 mg | ORAL_TABLET | Freq: Every evening | ORAL | Status: DC | PRN

## 2023-01-24 MED ORDER — WITCH HAZEL-GLYCERIN EX PADS: 1.0000 | MEDICATED_PAD | CUTANEOUS | Status: DC | PRN

## 2023-01-24 MED ORDER — SODIUM CHLORIDE 0.9% FLUSH: 3.0000 mL | INTRAVENOUS | Status: DC | PRN

## 2023-01-24 MED ORDER — SODIUM CHLORIDE 0.9 % IV SOLN: 500.0000 mg | INTRAVENOUS | Status: DC

## 2023-01-24 MED ORDER — OXYTOCIN-SODIUM CHLORIDE 30-0.9 UT/500ML-% IV SOLN
INTRAVENOUS | Status: DC | PRN
  Administered 2023-01-24: 300 mL via INTRAVENOUS

## 2023-01-24 MED ORDER — OXYCODONE-ACETAMINOPHEN 5-325 MG PO TABS
1.0000 | ORAL_TABLET | ORAL | Status: DC | PRN
  Administered 2023-01-25: 2 via ORAL
  Administered 2023-01-25: 1 via ORAL
  Administered 2023-01-26 (×2): 2 via ORAL
  Filled 2023-01-24 (×2): qty 2
  Filled 2023-01-24: qty 1
  Filled 2023-01-24: qty 2

## 2023-01-24 MED ORDER — OXYTOCIN-SODIUM CHLORIDE 30-0.9 UT/500ML-% IV SOLN: 2.5000 [IU]/h | INTRAVENOUS | Status: AC

## 2023-01-24 MED ORDER — MEASLES, MUMPS & RUBELLA VAC IJ SOLR: 0.5000 mL | Freq: Once | INTRAMUSCULAR | Status: DC

## 2023-01-24 MED ORDER — DIBUCAINE (PERIANAL) 1 % EX OINT: 1.0000 | TOPICAL_OINTMENT | CUTANEOUS | Status: DC | PRN

## 2023-01-24 MED ORDER — NALOXONE HCL 4 MG/10ML IJ SOLN: 1.0000 ug/kg/h | INTRAVENOUS | Status: DC | PRN

## 2023-01-24 MED ORDER — FENTANYL CITRATE (PF) 100 MCG/2ML IJ SOLN: 25.0000 ug | INTRAMUSCULAR | Status: DC | PRN

## 2023-01-24 MED ORDER — IBUPROFEN 600 MG PO TABS
600.0000 mg | ORAL_TABLET | Freq: Four times a day (QID) | ORAL | Status: DC | PRN
  Filled 2023-01-24: qty 1

## 2023-01-24 MED ORDER — DIPHENHYDRAMINE HCL 25 MG PO CAPS: 25.0000 mg | ORAL_CAPSULE | ORAL | Status: DC | PRN

## 2023-01-24 MED ORDER — BUPIVACAINE IN DEXTROSE 0.75-8.25 % IT SOLN
INTRATHECAL | Status: DC | PRN
  Administered 2023-01-24: 1.6 mg via INTRATHECAL

## 2023-01-24 MED ORDER — SOD CITRATE-CITRIC ACID 500-334 MG/5ML PO SOLN: 30.0000 mL | ORAL | Status: DC

## 2023-01-24 MED ORDER — EPHEDRINE 5 MG/ML INJ
INTRAVENOUS | Status: AC
  Filled 2023-01-24: qty 5

## 2023-01-24 MED ORDER — DIPHENHYDRAMINE HCL 50 MG/ML IJ SOLN: 12.5000 mg | INTRAMUSCULAR | Status: DC | PRN

## 2023-01-24 MED ORDER — EPHEDRINE SULFATE-NACL 50-0.9 MG/10ML-% IV SOSY
PREFILLED_SYRINGE | INTRAVENOUS | Status: DC | PRN
  Administered 2023-01-24: 10 mg via INTRAVENOUS

## 2023-01-24 MED ORDER — ONDANSETRON HCL 4 MG/2ML IJ SOLN
INTRAMUSCULAR | Status: AC
  Filled 2023-01-24: qty 2

## 2023-01-24 MED ORDER — SODIUM CHLORIDE 0.9 % IV SOLN
INTRAVENOUS | Status: AC
  Filled 2023-01-24: qty 2

## 2023-01-24 MED ORDER — OXYTOCIN-SODIUM CHLORIDE 30-0.9 UT/500ML-% IV SOLN
INTRAVENOUS | Status: AC
  Filled 2023-01-24: qty 500

## 2023-01-24 MED ORDER — MENTHOL 3 MG MT LOZG: 1.0000 | LOZENGE | OROMUCOSAL | Status: DC | PRN

## 2023-01-24 MED ORDER — DEXAMETHASONE SODIUM PHOSPHATE 10 MG/ML IJ SOLN
INTRAMUSCULAR | Status: AC
  Filled 2023-01-24: qty 1

## 2023-01-24 MED ORDER — DIPHENHYDRAMINE HCL 25 MG PO CAPS: 25.0000 mg | ORAL_CAPSULE | Freq: Four times a day (QID) | ORAL | Status: DC | PRN

## 2023-01-24 MED ORDER — SOD CITRATE-CITRIC ACID 500-334 MG/5ML PO SOLN
ORAL | Status: AC
  Filled 2023-01-24: qty 30

## 2023-01-24 MED ORDER — FENTANYL CITRATE (PF) 100 MCG/2ML IJ SOLN
INTRAMUSCULAR | Status: DC | PRN
  Administered 2023-01-24: 15 ug via INTRATHECAL

## 2023-01-24 MED ORDER — KETOROLAC TROMETHAMINE 30 MG/ML IJ SOLN
30.0000 mg | Freq: Four times a day (QID) | INTRAMUSCULAR | Status: AC | PRN
  Administered 2023-01-24 – 2023-01-25 (×3): 30 mg via INTRAVENOUS
  Filled 2023-01-24 (×3): qty 1

## 2023-01-24 MED ORDER — MORPHINE SULFATE (PF) 0.5 MG/ML IJ SOLN
INTRAMUSCULAR | Status: DC | PRN
  Administered 2023-01-24: 150 ug via EPIDURAL

## 2023-01-24 MED ORDER — CITALOPRAM HYDROBROMIDE 10 MG PO TABS
10.0000 mg | ORAL_TABLET | Freq: Every day | ORAL | Status: DC
  Filled 2023-01-24: qty 1

## 2023-01-24 MED ORDER — ONDANSETRON HCL 4 MG/2ML IJ SOLN
INTRAMUSCULAR | Status: DC | PRN
  Administered 2023-01-24: 4 mg via INTRAVENOUS

## 2023-01-24 MED ORDER — SENNOSIDES-DOCUSATE SODIUM 8.6-50 MG PO TABS
2.0000 | ORAL_TABLET | ORAL | Status: DC
  Administered 2023-01-24 – 2023-01-26 (×3): 2 via ORAL
  Filled 2023-01-24 (×3): qty 2

## 2023-01-24 MED ORDER — ACETAMINOPHEN 325 MG PO TABS: 650.0000 mg | ORAL_TABLET | ORAL | Status: DC | PRN

## 2023-01-24 MED ORDER — MORPHINE SULFATE (PF) 0.5 MG/ML IJ SOLN
INTRAMUSCULAR | Status: AC
  Filled 2023-01-24: qty 10

## 2023-01-24 MED ORDER — NALOXONE HCL 0.4 MG/ML IJ SOLN: 0.4000 mg | INTRAMUSCULAR | Status: DC | PRN

## 2023-01-24 MED ORDER — ONDANSETRON HCL 4 MG/2ML IJ SOLN
4.0000 mg | Freq: Three times a day (TID) | INTRAMUSCULAR | Status: DC | PRN
  Administered 2023-01-24: 4 mg via INTRAVENOUS
  Filled 2023-01-24: qty 2

## 2023-01-24 MED ORDER — FENTANYL CITRATE (PF) 100 MCG/2ML IJ SOLN
INTRAMUSCULAR | Status: AC
  Filled 2023-01-24: qty 2

## 2023-01-24 MED ORDER — SIMETHICONE 80 MG PO CHEW
80.0000 mg | CHEWABLE_TABLET | Freq: Three times a day (TID) | ORAL | Status: DC
  Administered 2023-01-24 – 2023-01-26 (×5): 80 mg via ORAL
  Filled 2023-01-24 (×4): qty 1

## 2023-01-24 MED ORDER — LACTATED RINGERS IV SOLN: INTRAVENOUS | Status: DC | PRN

## 2023-01-24 MED ORDER — PHENYLEPHRINE HCL-NACL 20-0.9 MG/250ML-% IV SOLN
INTRAVENOUS | Status: AC
  Filled 2023-01-24: qty 250

## 2023-01-24 MED ORDER — SIMETHICONE 80 MG PO CHEW: 80.0000 mg | CHEWABLE_TABLET | ORAL | Status: DC | PRN

## 2023-01-24 MED ORDER — ACETAMINOPHEN 10 MG/ML IV SOLN
INTRAVENOUS | Status: DC | PRN
  Administered 2023-01-24: 1000 mg via INTRAVENOUS

## 2023-01-24 MED ORDER — MEPERIDINE HCL 25 MG/ML IJ SOLN: 6.2500 mg | INTRAMUSCULAR | Status: DC | PRN

## 2023-01-24 MED ORDER — CEFAZOLIN SODIUM-DEXTROSE 2-4 GM/100ML-% IV SOLN
INTRAVENOUS | Status: AC
  Filled 2023-01-24: qty 100

## 2023-01-24 MED ORDER — ACETAMINOPHEN 500 MG PO TABS
1000.0000 mg | ORAL_TABLET | Freq: Four times a day (QID) | ORAL | Status: AC
  Administered 2023-01-24 – 2023-01-25 (×4): 1000 mg via ORAL
  Filled 2023-01-24 (×4): qty 2

## 2023-01-24 MED ORDER — SODIUM CHLORIDE 0.9 % IV SOLN
2.0000 g | Freq: Once | INTRAVENOUS | Status: AC
  Administered 2023-01-24: 2 g via INTRAVENOUS

## 2023-01-24 MED ORDER — PHENYLEPHRINE HCL-NACL 20-0.9 MG/250ML-% IV SOLN
INTRAVENOUS | Status: DC | PRN
  Administered 2023-01-24: 60 ug/min via INTRAVENOUS

## 2023-01-24 SURGICAL SUPPLY — 34 items
BENZOIN TINCTURE PRP APPL 2/3 (GAUZE/BANDAGES/DRESSINGS) IMPLANT
CHLORAPREP W/TINT 26 (MISCELLANEOUS) ×2 IMPLANT
CLAMP UMBILICAL CORD (MISCELLANEOUS) ×1 IMPLANT
CLIP FILSHIE TUBAL LIGA STRL (Clip) IMPLANT
CLOTH BEACON ORANGE TIMEOUT ST (SAFETY) ×1 IMPLANT
DRSG OPSITE POSTOP 4X10 (GAUZE/BANDAGES/DRESSINGS) ×1 IMPLANT
ELECT REM PT RETURN 9FT ADLT (ELECTROSURGICAL) ×1
ELECTRODE REM PT RTRN 9FT ADLT (ELECTROSURGICAL) ×1 IMPLANT
EXTRACTOR VACUUM M CUP 4 TUBE (SUCTIONS) IMPLANT
GAUZE SPONGE 4X4 12PLY STRL LF (GAUZE/BANDAGES/DRESSINGS) IMPLANT
GLOVE BIOGEL PI IND STRL 7.0 (GLOVE) ×1 IMPLANT
GLOVE SURG ORTHO 8.0 STRL STRW (GLOVE) ×1 IMPLANT
GOWN STRL REUS W/TWL LRG LVL3 (GOWN DISPOSABLE) ×2 IMPLANT
KIT ABG SYR 3ML LUER SLIP (SYRINGE) ×1 IMPLANT
MAT PREVALON FULL STRYKER (MISCELLANEOUS) IMPLANT
NDL HYPO 25X5/8 SAFETYGLIDE (NEEDLE) ×1 IMPLANT
NEEDLE HYPO 25X5/8 SAFETYGLIDE (NEEDLE) ×1 IMPLANT
NS IRRIG 1000ML POUR BTL (IV SOLUTION) ×1 IMPLANT
PACK C SECTION WH (CUSTOM PROCEDURE TRAY) ×1 IMPLANT
PAD ABD 7.5X8 STRL (GAUZE/BANDAGES/DRESSINGS) IMPLANT
PAD OB MATERNITY 4.3X12.25 (PERSONAL CARE ITEMS) ×1 IMPLANT
RETRACTOR TRAXI PANNICULUS (MISCELLANEOUS) IMPLANT
STRIP CLOSURE SKIN 1/2X4 (GAUZE/BANDAGES/DRESSINGS) IMPLANT
SUT MNCRL 0 VIOLET CTX 36 (SUTURE) ×3 IMPLANT
SUT MON AB 4-0 PS1 27 (SUTURE) ×1 IMPLANT
SUT MONOCRYL 0 CTX 36 (SUTURE) ×3
SUT PDS AB 0 CTX 60 (SUTURE) IMPLANT
SUT PDS AB 1 CT  36 (SUTURE)
SUT PDS AB 1 CT 36 (SUTURE) IMPLANT
SUT VIC AB 1 CTX 36 (SUTURE)
SUT VIC AB 1 CTX36XBRD ANBCTRL (SUTURE) IMPLANT
TOWEL OR 17X24 6PK STRL BLUE (TOWEL DISPOSABLE) ×1 IMPLANT
TRAY FOLEY W/BAG SLVR 14FR LF (SET/KITS/TRAYS/PACK) ×1 IMPLANT
WATER STERILE IRR 1000ML POUR (IV SOLUTION) ×1 IMPLANT

## 2023-01-24 NOTE — Anesthesia Preprocedure Evaluation (Signed)
Anesthesia Evaluation  Patient identified by MRN, date of birth, ID band Patient awake    Reviewed: Allergy & Precautions, NPO status , Patient's Chart, lab work & pertinent test results  History of Anesthesia Complications (+) Family history of anesthesia reactionNegative for: history of anesthetic complications (mom with PONV)  Airway Mallampati: I  TM Distance: >3 FB Neck ROM: Full    Dental  (+) Teeth Intact, Dental Advisory Given   Pulmonary neg pulmonary ROS   Pulmonary exam normal breath sounds clear to auscultation       Cardiovascular negative cardio ROS  Rhythm:Regular Rate:Normal     Neuro/Psych  Headaches, neg Seizures    GI/Hepatic Neg liver ROS,GERD  ,,H/o gastric sleeve 2017   Endo/Other  negative endocrine ROS    Renal/GU negative Renal ROS     Musculoskeletal   Abdominal  (+) + obese  Peds  Hematology negative hematology ROS (+)   Anesthesia Other Findings 32 y.o. female presenting for scheduled RCS with BTL.History of CS for arrest of dilation (7#13). She has PCOS and conceived with clomid. She has a history of gastric sleeve.   Reproductive/Obstetrics (+) Pregnancy                              Anesthesia Physical Anesthesia Plan  ASA: 3  Anesthesia Plan: Spinal   Post-op Pain Management:    Induction:   PONV Risk Score and Plan: 2 and Ondansetron, Dexamethasone and Treatment may vary due to age or medical condition  Airway Management Planned: Natural Airway  Additional Equipment:   Intra-op Plan:   Post-operative Plan:   Informed Consent: I have reviewed the patients History and Physical, chart, labs and discussed the procedure including the risks, benefits and alternatives for the proposed anesthesia with the patient or authorized representative who has indicated his/her understanding and acceptance.     Dental advisory given  Plan Discussed with:  CRNA and Anesthesiologist  Anesthesia Plan Comments: (I have discussed risks of neuraxial anesthesia including but not limited to infection, bleeding, nerve injury, back pain, headache, seizures, and failure of block. Patient denies bleeding disorders and is not currently anticoagulated. Labs have been reviewed. Risks and benefits discussed. All patient's questions answered.  )         Anesthesia Quick Evaluation

## 2023-01-24 NOTE — Anesthesia Postprocedure Evaluation (Signed)
Anesthesia Post Note  Patient: Krystal Ferguson  Procedure(s) Performed: REPEAT CESAREAN SECTION WITH BILATERAL TUBAL LIGATION EDC: 01-30-23 ALLERG: NKDA  PREVIOUS X 1     Patient location during evaluation: PACU Anesthesia Type: Spinal Level of consciousness: awake and alert Pain management: pain level controlled Vital Signs Assessment: post-procedure vital signs reviewed and stable Respiratory status: spontaneous breathing and respiratory function stable Cardiovascular status: blood pressure returned to baseline and stable Postop Assessment: spinal receding Anesthetic complications: no  No notable events documented.  Last Vitals:  Vitals:   01/24/23 1015 01/24/23 1133  BP: 95/68 (!) 97/59  Pulse: 67 62  Resp: 20 20  Temp: 37.1 C   SpO2: 100% 99%    Last Pain:  Vitals:   01/24/23 1133  TempSrc:   PainSc: 0-No pain   Pain Goal:                Epidural/Spinal Function Cutaneous sensation: Normal sensation (01/24/23 1133)  Tiajuana Amass

## 2023-01-24 NOTE — Op Note (Signed)
Cesarean Section Procedure Note  Pre-operative Diagnosis: IUP at 39 weeks, previous c/s desires repeat, desires sterilization  Post-operative Diagnosis: same  Surgeon: Luz Lex   Assistants: Dr Geraldo Docker, An experienced assistant was required given the standard of surgical care given the complexity of the case.  This assistant was needed for exposure, dissection, suctioning, retraction, instrument exchange, assisting with delivery with administration of fundal pressure, and for overall help during the procedure  Anesthesia: Spinal  Procedure:  Low Segment Transverse cesarean section and BTL with filshie clips  Procedure Details  The patient was seen in the Holding Room. The risks, benefits, complications, treatment options, and expected outcomes were discussed with the patient.  The patient concurred with the proposed plan, giving informed consent.  The site of surgery properly noted/marked.. A Time Out was held and the above information confirmed.  After induction of anesthesia, the patient was draped and prepped in the usual sterile manner. A Pfannenstiel incision was made and carried down through the subcutaneous tissue to the fascia. Fascial incision was made and extended transversely. The fascia was separated from the underlying rectus tissue superiorly and inferiorly. The peritoneum was identified and entered. Peritoneal incision was extended longitudinally. The utero-vesical peritoneal reflection was incised transversely and the bladder flap was bluntly freed from the lower uterine segment. A low transverse uterine incision was made. Delivered from vertex presentation was a baby with Apgar scores of 9 at one minute and 9 at five minutes. After the umbilical cord was clamped and cut cord blood was obtained for evaluation. The placenta was removed intact and appeared normal. The uterine outline, tubes and ovaries appeared normal. The uterine incision was closed with running locked sutures of  0 monocryl and imbricated with 0 monocryl. Hemostasis was observed. Lavage was carried out until clear.The fallopian tubes were identified by their fimbriated ends and filshie clips were placed across the tubes at the midportion of each tube.  Good placement was noted bilaterally.  The peritoneum was then closed with 0 monocryl and rectus muscles plicated in the midline.  After hemostasis was assured, the fascia was then reapproximated with running sutures of Double stranded PDS. Irrigation was applied and after adequate hemostasis was assured, the skin was reapproximated with subcutaneous sutures using 4-0 monocryl.  Instrument, sponge, and needle counts were correct prior the abdominal closure and at the conclusion of the case. The patient received 2 grams cefotetan preoperatively.  Findings: Viable female  Estimated Blood Loss:  1021cc         Specimens: Placenta was sent to labor and delivery         Complications:  None

## 2023-01-24 NOTE — Anesthesia Procedure Notes (Addendum)
Spinal  Patient location during procedure: OR Start time: 01/24/2023 7:30 AM End time: 01/24/2023 7:43 AM Reason for block: surgical anesthesia Staffing Performed: anesthesiologist  Anesthesiologist: Suzette Battiest, MD Performed by: Suzette Battiest, MD Authorized by: Suzette Battiest, MD   Preanesthetic Checklist Completed: patient identified, IV checked, site marked, risks and benefits discussed, surgical consent, monitors and equipment checked, pre-op evaluation and timeout performed Spinal Block Patient position: sitting Prep: DuraPrep Patient monitoring: heart rate, cardiac monitor, continuous pulse ox and blood pressure Approach: midline Location: L3-4 Injection technique: single-shot Needle Needle type: Tuohy and Pencan  Needle gauge: 24 G Needle length: 9 cm Assessment Sensory level: T4 Events: CSF return Additional Notes CSE performed due to unsuccessful attempts at primary SAB. LOR to air at 7cm. Easy pass of spinal needle through touhy with return of CSF. Aspiration before and after injection LA.

## 2023-01-24 NOTE — Transfer of Care (Signed)
Immediate Anesthesia Transfer of Care Note  Patient: Krystal Ferguson  Procedure(s) Performed: REPEAT CESAREAN SECTION WITH BILATERAL TUBAL LIGATION EDC: 01-30-23 ALLERG: NKDA  PREVIOUS X 1  Patient Location: PACU  Anesthesia Type:Spinal  Level of Consciousness: awake  Airway & Oxygen Therapy: Patient Spontanous Breathing  Post-op Assessment: Report given to RN  Post vital signs: Reviewed and stable  Last Vitals:  Vitals Value Taken Time  BP    Temp    Pulse 75 01/24/23 0853  Resp 12 01/24/23 0853  SpO2 98 % 01/24/23 0853  Vitals shown include unvalidated device data.  Last Pain:  Vitals:   01/24/23 0548  TempSrc:   PainSc: 0-No pain         Complications: No notable events documented.

## 2023-01-25 LAB — CBC
HCT: 25 % — ABNORMAL LOW (ref 36.0–46.0)
Hemoglobin: 8 g/dL — ABNORMAL LOW (ref 12.0–15.0)
MCH: 26.7 pg (ref 26.0–34.0)
MCHC: 32 g/dL (ref 30.0–36.0)
MCV: 83.3 fL (ref 80.0–100.0)
Platelets: 147 10*3/uL — ABNORMAL LOW (ref 150–400)
RBC: 3 MIL/uL — ABNORMAL LOW (ref 3.87–5.11)
RDW: 14.4 % (ref 11.5–15.5)
WBC: 10.6 10*3/uL — ABNORMAL HIGH (ref 4.0–10.5)
nRBC: 0 % (ref 0.0–0.2)

## 2023-01-25 MED ORDER — SODIUM CHLORIDE 0.9 % IV SOLN
300.0000 mg | Freq: Once | INTRAVENOUS | Status: AC
  Administered 2023-01-25: 300 mg via INTRAVENOUS
  Filled 2023-01-25: qty 15

## 2023-01-25 NOTE — Progress Notes (Signed)
Postpartum Progress Note  Postpartum Day 1 s/p repeat Cesarean section and BTL  Subjective:  Patient reports no overnight events.  She reports well controlled pain, ambulating without difficulty, voiding spontaneously, tolerating PO.  She reports Positive flatus, Negative BM.  Vaginal bleeding is minimal.  Objective: Blood pressure (!) 97/56, pulse (!) 55, temperature 98.1 F (36.7 C), temperature source Oral, resp. rate 16, height 5' 3"$  (1.6 m), weight 110.5 kg, last menstrual period 04/25/2022, SpO2 99 %, unknown if currently breastfeeding.  Physical Exam:  General: alert and no distress Lochia: appropriate Uterine Fundus: firm Incision: dressing in place DVT Evaluation: No evidence of DVT seen on physical exam.  Recent Labs    01/22/23 0903 01/25/23 0506  HGB 11.6* 8.0*  HCT 36.6 25.0*    Assessment/Plan: Postpartum Day 1, s/p C-section Acute blood loss anemia - Will order IV Fe. No signs or symptoms of anemia.  Baby girl Doing well, continue routine postpartum care. Anticipate discharge PPD2 or 3.   LOS: 1 day   Carlyon Shadow 01/25/2023, 7:24 AM

## 2023-01-26 ENCOUNTER — Encounter (HOSPITAL_COMMUNITY): Payer: Self-pay | Admitting: Obstetrics and Gynecology

## 2023-01-26 ENCOUNTER — Other Ambulatory Visit (HOSPITAL_COMMUNITY): Payer: Self-pay

## 2023-01-26 MED ORDER — IBUPROFEN 600 MG PO TABS
600.0000 mg | ORAL_TABLET | Freq: Four times a day (QID) | ORAL | 0 refills | Status: DC | PRN
  Filled 2023-01-26: qty 30, 8d supply, fill #0

## 2023-01-26 MED ORDER — PHENAZOPYRIDINE HCL 200 MG PO TABS: 200.0000 mg | ORAL_TABLET | Freq: Three times a day (TID) | ORAL | 0 refills | Status: AC

## 2023-01-26 MED ORDER — PHENAZOPYRIDINE HCL 200 MG PO TABS
200.0000 mg | ORAL_TABLET | Freq: Three times a day (TID) | ORAL | Status: DC
  Administered 2023-01-26: 200 mg via ORAL
  Filled 2023-01-26: qty 1

## 2023-01-26 MED ORDER — PHENAZOPYRIDINE HCL 200 MG PO TABS
200.0000 mg | ORAL_TABLET | Freq: Three times a day (TID) | ORAL | 0 refills | Status: DC
  Filled 2023-01-26 (×2): qty 3, 1d supply, fill #0

## 2023-01-26 MED ORDER — OXYCODONE HCL 5 MG PO TABS
5.0000 mg | ORAL_TABLET | Freq: Four times a day (QID) | ORAL | 0 refills | Status: DC | PRN
  Filled 2023-01-26: qty 12, 3d supply, fill #0

## 2023-01-26 MED ORDER — ACETAMINOPHEN 325 MG PO TABS
650.0000 mg | ORAL_TABLET | ORAL | 0 refills | Status: AC | PRN
  Filled 2023-01-26: qty 100, 9d supply, fill #0

## 2023-01-26 MED ORDER — OXYCODONE HCL 5 MG PO TABS
5.0000 mg | ORAL_TABLET | Freq: Four times a day (QID) | ORAL | 0 refills | Status: AC | PRN
  Filled 2023-01-26: qty 12, 3d supply, fill #0

## 2023-01-26 MED ORDER — IBUPROFEN 600 MG PO TABS
600.0000 mg | ORAL_TABLET | Freq: Four times a day (QID) | ORAL | 0 refills | Status: DC | PRN
  Filled 2023-01-26 (×3): qty 30, 8d supply, fill #0

## 2023-01-26 MED ORDER — ACETAMINOPHEN 325 MG PO TABS
650.0000 mg | ORAL_TABLET | ORAL | 0 refills | Status: DC | PRN
  Filled 2023-01-26 (×2): qty 30, 3d supply, fill #0

## 2023-01-26 NOTE — Plan of Care (Signed)
Patient to be discharged home with printed instructions. Toya Smothers, RN

## 2023-01-26 NOTE — Progress Notes (Signed)
Postpartum Progress Note  Postpartum Day 2 s/p repeat Cesarean section with BTL  Subjective:  Patient reports no overnight events.  She reports well controlled pain, ambulating without difficulty, voiding spontaneously, tolerating PO.  She reports Positive flatus, Negative BM.  Vaginal bleeding is minimal.  Objective: Blood pressure (!) 96/55, pulse 75, temperature 98 F (36.7 C), temperature source Oral, resp. rate 16, height 5' 3"$  (1.6 m), weight 110.5 kg, last menstrual period 04/25/2022, SpO2 98 %, unknown if currently breastfeeding.  Physical Exam:  General: alert and no distress Lochia: appropriate Uterine Fundus: firm Incision: dressing in place DVT Evaluation: No evidence of DVT seen on physical exam.  Recent Labs    01/25/23 0506  HGB 8.0*  HCT 25.0*    Assessment/Plan: Postpartum Day 2, s/p C-section Acute blood loss anemia, s/p IV Fe. No signs or symptoms of anemia Baby girl Pyridium for post-catheter pain Abdominal binder added Doing well, continue routine postpartum care. Anticipate discharge home today. Plan for 1 week incision check and HGB check in office.    LOS: 2 days   Carlyon Shadow 01/26/2023, 7:04 AM

## 2023-01-26 NOTE — Discharge Summary (Signed)
Obstetric Discharge Summary  Krystal Ferguson is a 32 y.o. female that presented on 01/24/2023 for repeat C section and bilateral tubal ligation.  She delivered a viable female infant on 01/24/2023.  Her postpartum course was uncomplicated and on PPD#2, she reported well controlled pain, spontaneous voiding, ambulating without difficulty, and tolerating PO. She received IV iron for anemia. She was stable for discharge home on 01/26/23 with plans for in-office follow up.  Hemoglobin  Date Value Ref Range Status  01/25/2023 8.0 (L) 12.0 - 15.0 g/dL Final   HCT  Date Value Ref Range Status  01/25/2023 25.0 (L) 36.0 - 46.0 % Final    Physical Exam:  General: alert and no distress Lochia: appropriate Uterine Fundus: firm Incision: healing well DVT Evaluation: No evidence of DVT seen on physical exam.  Discharge Diagnoses: Term Pregnancy-delivered  Discharge Information: Date: 01/26/2023 Activity: Pelvic rest, as tolerated Diet: routine Medications: Tylenol, motrin, oxycodone Condition: stable Instructions: Refer to practice specific booklet.  Discussed prior to discharge.  Discharge to: [facility] Follow up.   Why: Please follow up for a 1 week incision check, followed by 6 week postpartum visit. Contact information: [place] [address] [phone]                 Newborn Data: Live born female  Birth Weight: 7 lb 7.6 oz (3390 g) APGAR: 73, 9  Newborn Delivery   Birth date/time: 01/24/2023 08:07:00 Delivery type: C-Section, Low Transverse Trial of labor: No C-section categorization: Primary      Home with mother.  Carlyon Shadow 01/26/2023, 1:51 PM

## 2023-01-27 ENCOUNTER — Encounter (HOSPITAL_COMMUNITY): Payer: Self-pay | Admitting: Obstetrics and Gynecology

## 2023-01-31 ENCOUNTER — Other Ambulatory Visit (HOSPITAL_COMMUNITY): Payer: Self-pay

## 2023-02-04 DIAGNOSIS — L720 Epidermal cyst: Secondary | ICD-10-CM | POA: Diagnosis not present

## 2023-02-05 ENCOUNTER — Telehealth (HOSPITAL_COMMUNITY): Payer: Self-pay | Admitting: *Deleted

## 2023-02-05 NOTE — Telephone Encounter (Signed)
Hospital Discharge Follow-Up Call:  Patient reports that she is doing well.  She was concerned about some bleeding from her incision, but she saw her OB earlier today and was told that what she is seeing is normal considering she has a known hematoma.  EPDS today was 3 and she endorses this accurately reflects that she is doing well emotionally.  Patient says that baby is well and she has no concerns about baby's health. She reports that baby sleeps in a bassinet.  Reviewed ABCs of Safe Sleep.

## 2023-02-06 ENCOUNTER — Other Ambulatory Visit (HOSPITAL_COMMUNITY): Payer: Self-pay

## 2023-02-06 DIAGNOSIS — Z9884 Bariatric surgery status: Secondary | ICD-10-CM | POA: Diagnosis not present

## 2023-02-06 DIAGNOSIS — R7303 Prediabetes: Secondary | ICD-10-CM | POA: Diagnosis not present

## 2023-02-06 MED ORDER — PHENTERMINE HCL 37.5 MG PO CAPS
37.5000 mg | ORAL_CAPSULE | Freq: Every morning | ORAL | 0 refills | Status: DC
  Filled 2023-02-06: qty 30, 30d supply, fill #0

## 2023-02-08 DIAGNOSIS — R7303 Prediabetes: Secondary | ICD-10-CM | POA: Diagnosis not present

## 2023-02-13 DIAGNOSIS — H43393 Other vitreous opacities, bilateral: Secondary | ICD-10-CM | POA: Diagnosis not present

## 2023-02-13 DIAGNOSIS — H10413 Chronic giant papillary conjunctivitis, bilateral: Secondary | ICD-10-CM | POA: Diagnosis not present

## 2023-02-20 ENCOUNTER — Other Ambulatory Visit (HOSPITAL_COMMUNITY): Payer: Self-pay

## 2023-02-20 MED ORDER — LIDOCAINE 5 % EX PTCH
MEDICATED_PATCH | CUTANEOUS | 1 refills | Status: AC
  Filled 2023-02-20: qty 3, 3d supply, fill #0
  Filled 2023-03-11 – 2023-03-12 (×2): qty 3, 3d supply, fill #1

## 2023-02-28 ENCOUNTER — Other Ambulatory Visit (HOSPITAL_COMMUNITY): Payer: Self-pay

## 2023-02-28 MED ORDER — PHENTERMINE HCL 37.5 MG PO CAPS
37.5000 mg | ORAL_CAPSULE | Freq: Every morning | ORAL | 0 refills | Status: DC
  Filled 2023-03-11: qty 30, 30d supply, fill #0

## 2023-03-11 ENCOUNTER — Other Ambulatory Visit (HOSPITAL_COMMUNITY): Payer: Self-pay

## 2023-03-12 ENCOUNTER — Other Ambulatory Visit (HOSPITAL_COMMUNITY): Payer: Self-pay

## 2023-04-24 ENCOUNTER — Other Ambulatory Visit (HOSPITAL_COMMUNITY): Payer: Self-pay

## 2023-04-24 MED ORDER — PHENTERMINE HCL 37.5 MG PO CAPS
37.5000 mg | ORAL_CAPSULE | Freq: Every morning | ORAL | 0 refills | Status: DC
  Filled 2023-04-24: qty 30, 30d supply, fill #0

## 2023-06-08 ENCOUNTER — Other Ambulatory Visit (HOSPITAL_COMMUNITY): Payer: Self-pay

## 2023-06-08 MED ORDER — PHENTERMINE HCL 37.5 MG PO TABS
37.5000 mg | ORAL_TABLET | Freq: Every morning | ORAL | 0 refills | Status: DC
  Filled 2023-06-08: qty 30, 30d supply, fill #0

## 2023-06-10 ENCOUNTER — Other Ambulatory Visit: Payer: Self-pay

## 2023-06-10 ENCOUNTER — Other Ambulatory Visit (HOSPITAL_COMMUNITY): Payer: Self-pay

## 2023-06-28 ENCOUNTER — Other Ambulatory Visit (HOSPITAL_COMMUNITY): Payer: Self-pay

## 2023-06-28 DIAGNOSIS — Z9884 Bariatric surgery status: Secondary | ICD-10-CM | POA: Diagnosis not present

## 2023-06-28 DIAGNOSIS — R7303 Prediabetes: Secondary | ICD-10-CM | POA: Diagnosis not present

## 2023-06-28 MED ORDER — TOPIRAMATE 25 MG PO TABS
25.0000 mg | ORAL_TABLET | Freq: Every day | ORAL | 0 refills | Status: DC
  Filled 2023-06-28: qty 14, 14d supply, fill #0

## 2023-06-28 MED ORDER — TOPIRAMATE 50 MG PO TABS
50.0000 mg | ORAL_TABLET | Freq: Every day | ORAL | 0 refills | Status: DC
  Filled 2023-06-28: qty 30, 30d supply, fill #0

## 2023-07-11 ENCOUNTER — Other Ambulatory Visit (HOSPITAL_COMMUNITY): Payer: Self-pay

## 2023-07-12 ENCOUNTER — Other Ambulatory Visit (HOSPITAL_COMMUNITY): Payer: Self-pay

## 2023-07-16 ENCOUNTER — Other Ambulatory Visit (HOSPITAL_COMMUNITY): Payer: Self-pay

## 2023-07-17 ENCOUNTER — Other Ambulatory Visit (HOSPITAL_COMMUNITY): Payer: Self-pay

## 2023-07-17 MED ORDER — PHENTERMINE HCL 37.5 MG PO TABS
37.5000 mg | ORAL_TABLET | Freq: Every morning | ORAL | 0 refills | Status: DC
  Filled 2023-07-17: qty 30, 30d supply, fill #0

## 2023-07-23 ENCOUNTER — Other Ambulatory Visit (HOSPITAL_COMMUNITY): Payer: Self-pay

## 2023-08-12 ENCOUNTER — Other Ambulatory Visit (HOSPITAL_COMMUNITY): Payer: Self-pay

## 2023-08-12 MED ORDER — PHENTERMINE HCL 37.5 MG PO CAPS
37.5000 mg | ORAL_CAPSULE | Freq: Every morning | ORAL | 0 refills | Status: DC
  Filled 2023-08-12: qty 30, 30d supply, fill #0

## 2023-08-12 MED ORDER — TOPIRAMATE 50 MG PO TABS
50.0000 mg | ORAL_TABLET | Freq: Every day | ORAL | 0 refills | Status: DC
  Filled 2023-08-12: qty 30, 30d supply, fill #0

## 2023-08-13 ENCOUNTER — Other Ambulatory Visit (HOSPITAL_COMMUNITY): Payer: Self-pay

## 2023-08-13 ENCOUNTER — Other Ambulatory Visit: Payer: Self-pay

## 2023-08-14 ENCOUNTER — Other Ambulatory Visit: Payer: Self-pay

## 2023-09-02 IMAGING — CT CT HEAD W/O CM
3 series · 14 of 47 positions shown, 16 images · non-contrast
Comparison: 04/06/2014

CLINICAL DATA: Severe acute headache

EXAM:
CT HEAD WITHOUT CONTRAST
TECHNIQUE: Contiguous axial images were obtained from the base of the skull
through the vertex without intravenous contrast.

[Series 4: head 5.0 h30s · axial · 0.45mm/px · z∈[+1160,+1295]mm · 8 of 33 slices shown, 10 images]
[im 3/33  brain]
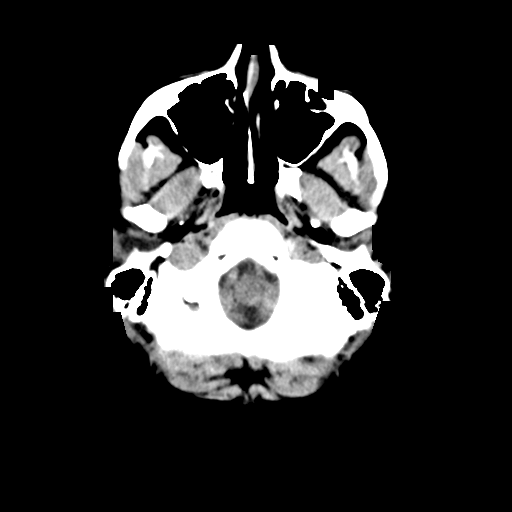
[im 3/33  bone]
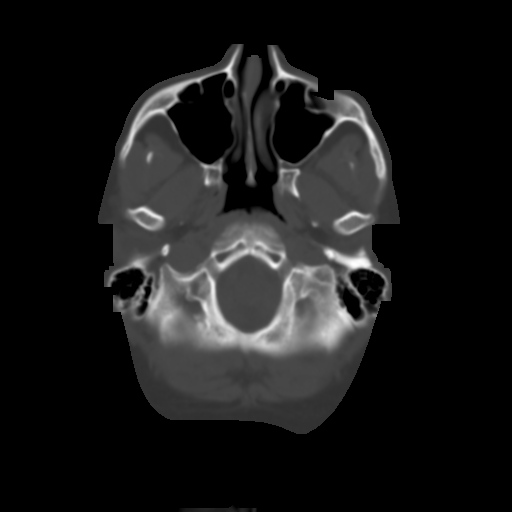
[im 7/33  brain]
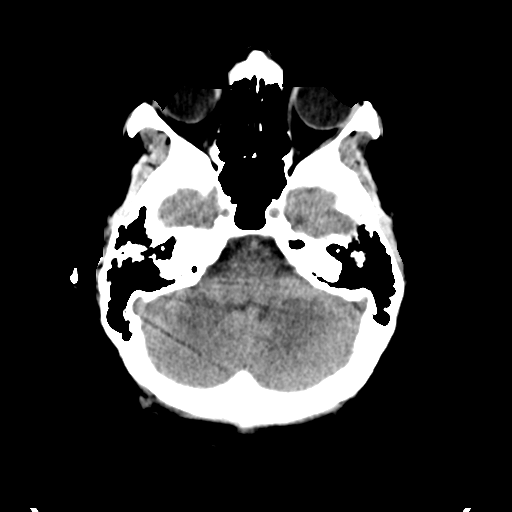
[im 10/33  brain]
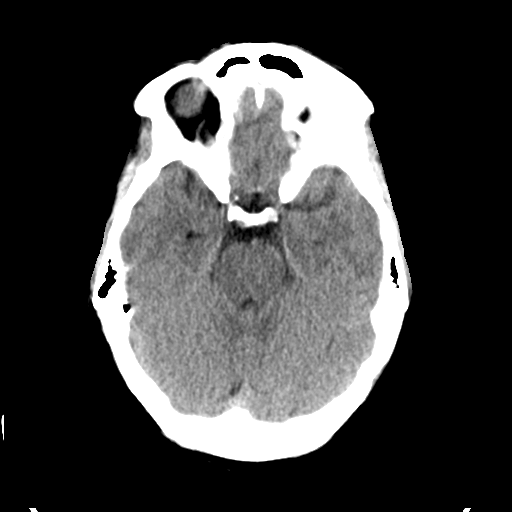
[im 15/33  brain]
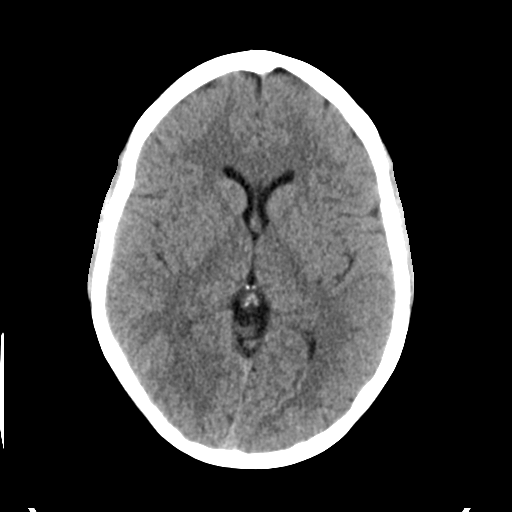
[im 18/33  brain]
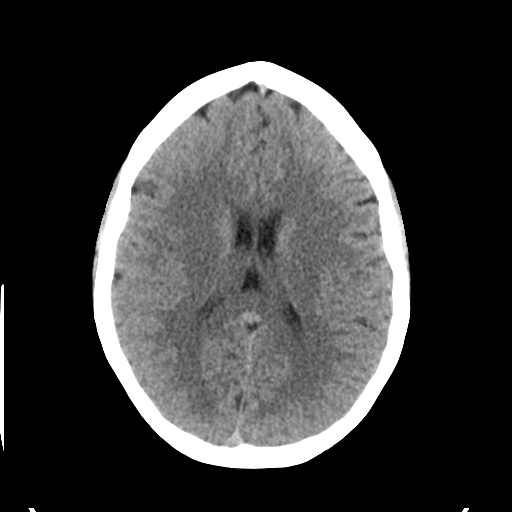
[im 18/33  bone]
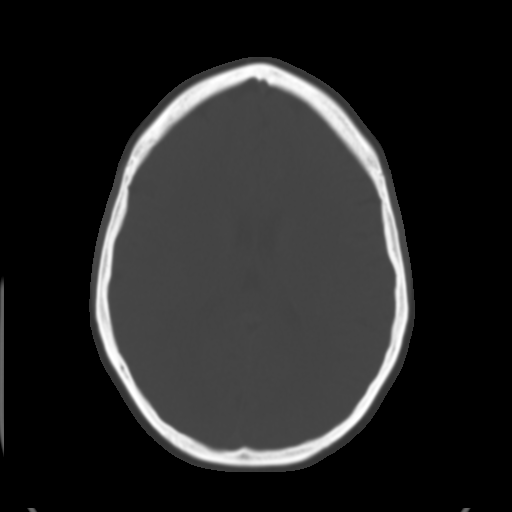
[im 23/33  brain]
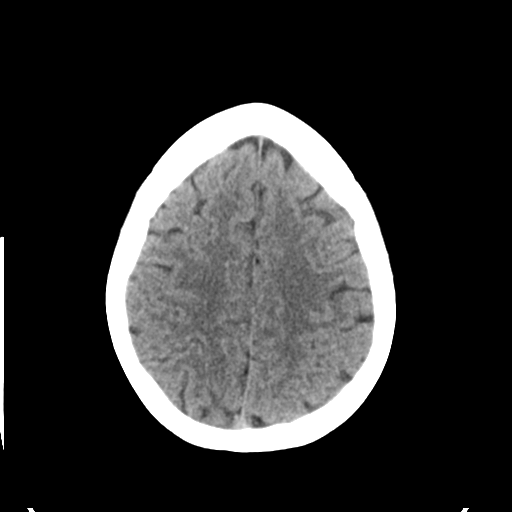
[im 26/33  brain]
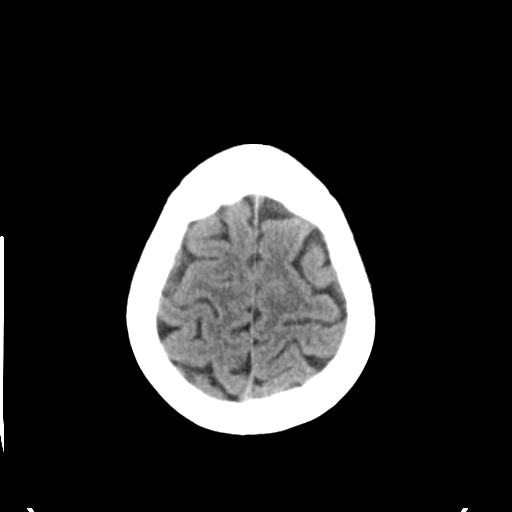
[im 30/33  brain]
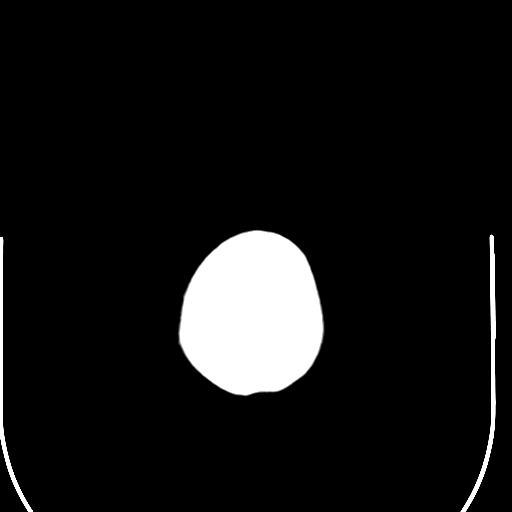

[Series 5: head 3.0 mpr cor · coronal · 0.32mm/px · 3 of 69 slices shown]
[im 23/69  brain]
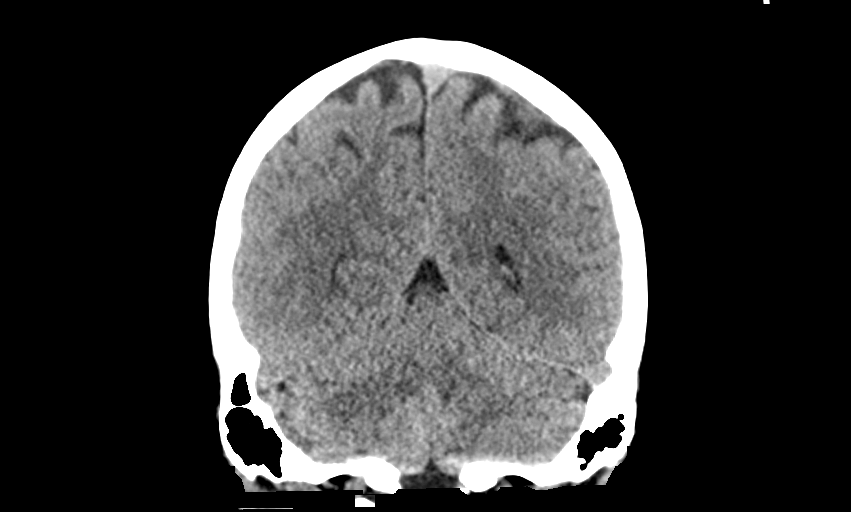
[im 31/69  brain]
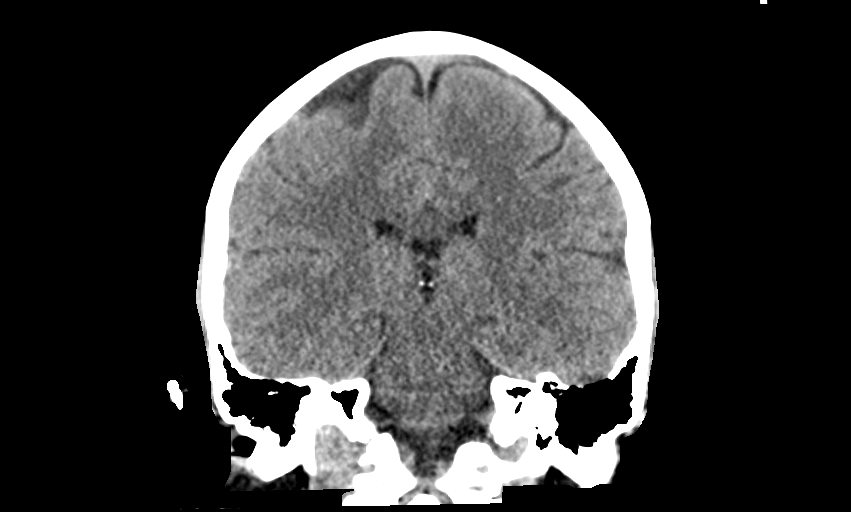
[im 38/69  brain]
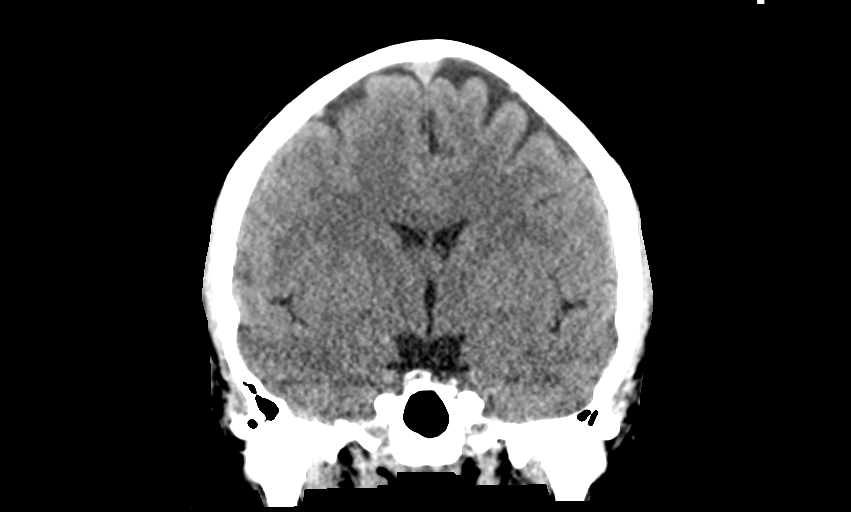

[Series 6: head 3.0 mpr sag · sagittal · 0.32mm/px · 3 of 55 slices shown]
[im 19/55  brain]
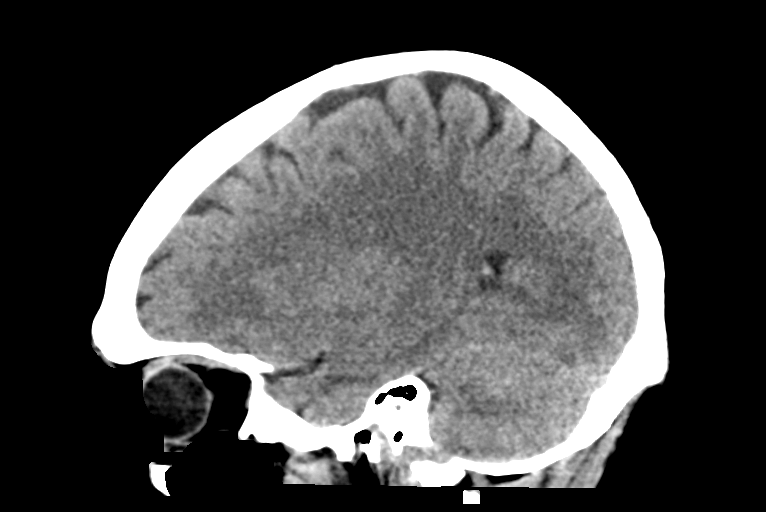
[im 28/55  brain]
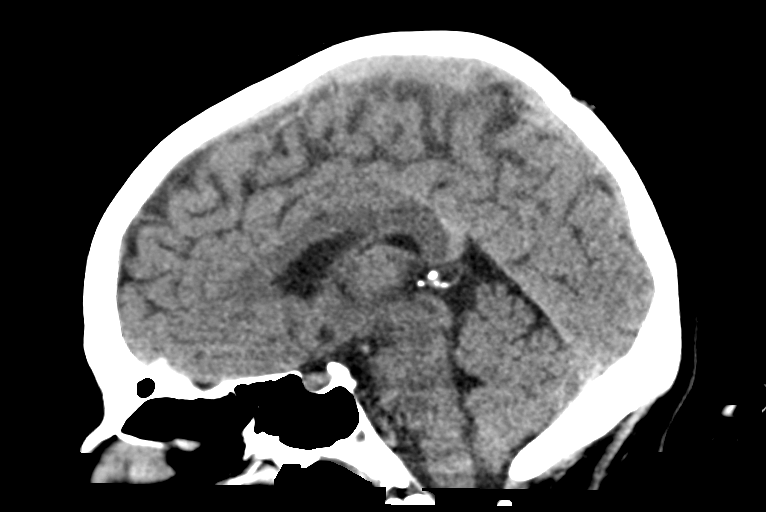
[im 37/55  brain]
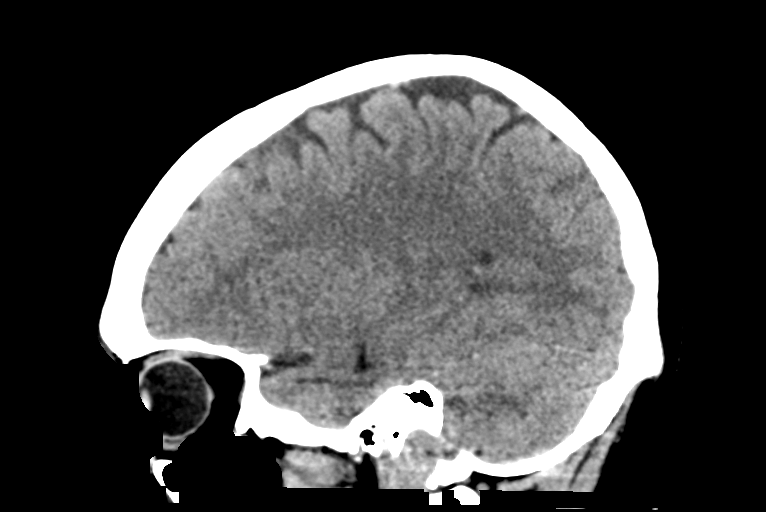

[14 of 47 positions shown; findings below may reference images not displayed]

FINDINGS: Brain: No evidence of acute infarction, hemorrhage, hydrocephalus,
extra-axial collection or mass lesion/mass effect.

Small chronic posterior left frontal subcortical white matter
bandlike hypodensity, image [DATE].

Vascular: No hyperdense vessel or unexpected calcification.

Skull: Normal. Negative for fracture or focal lesion.

Sinuses/Orbits: No acute finding.

Other: None.
IMPRESSION: No acute intracranial abnormality.  No new finding.

Stable posterior left frontal lobe subcortical chronic hypodensity.
Please refer to the prior MRI reports.

## 2023-09-03 DIAGNOSIS — E282 Polycystic ovarian syndrome: Secondary | ICD-10-CM | POA: Diagnosis not present

## 2023-09-03 DIAGNOSIS — R7303 Prediabetes: Secondary | ICD-10-CM | POA: Diagnosis not present

## 2023-09-03 DIAGNOSIS — D509 Iron deficiency anemia, unspecified: Secondary | ICD-10-CM | POA: Diagnosis not present

## 2023-09-03 DIAGNOSIS — Z0000 Encounter for general adult medical examination without abnormal findings: Secondary | ICD-10-CM | POA: Diagnosis not present

## 2023-09-03 DIAGNOSIS — Z9884 Bariatric surgery status: Secondary | ICD-10-CM | POA: Diagnosis not present

## 2023-09-10 ENCOUNTER — Other Ambulatory Visit (HOSPITAL_COMMUNITY): Payer: Self-pay

## 2023-09-10 MED ORDER — TOPIRAMATE 50 MG PO TABS
50.0000 mg | ORAL_TABLET | Freq: Every day | ORAL | 0 refills | Status: DC
  Filled 2023-09-10: qty 30, 30d supply, fill #0

## 2023-09-10 MED ORDER — PHENTERMINE HCL 37.5 MG PO CAPS
37.5000 mg | ORAL_CAPSULE | ORAL | 0 refills | Status: DC
  Filled 2023-09-11: qty 30, 30d supply, fill #0

## 2023-09-11 ENCOUNTER — Other Ambulatory Visit (HOSPITAL_COMMUNITY): Payer: Self-pay

## 2023-09-11 ENCOUNTER — Other Ambulatory Visit: Payer: Self-pay

## 2023-10-02 ENCOUNTER — Other Ambulatory Visit (HOSPITAL_COMMUNITY): Payer: Self-pay

## 2023-10-02 DIAGNOSIS — L700 Acne vulgaris: Secondary | ICD-10-CM | POA: Diagnosis not present

## 2023-10-02 MED ORDER — DOXYCYCLINE MONOHYDRATE 100 MG PO CAPS
100.0000 mg | ORAL_CAPSULE | Freq: Every day | ORAL | 2 refills | Status: DC
  Filled 2023-10-02: qty 30, 30d supply, fill #0
  Filled 2023-11-05: qty 30, 30d supply, fill #1
  Filled 2023-12-14: qty 30, 30d supply, fill #2

## 2023-10-11 ENCOUNTER — Other Ambulatory Visit (HOSPITAL_COMMUNITY): Payer: Self-pay

## 2023-10-11 DIAGNOSIS — R7303 Prediabetes: Secondary | ICD-10-CM | POA: Diagnosis not present

## 2023-10-11 DIAGNOSIS — Z9884 Bariatric surgery status: Secondary | ICD-10-CM | POA: Diagnosis not present

## 2023-10-11 MED ORDER — PHENTERMINE HCL 37.5 MG PO TABS
37.5000 mg | ORAL_TABLET | Freq: Every morning | ORAL | 0 refills | Status: DC
  Filled 2023-10-11: qty 30, 30d supply, fill #0

## 2023-10-11 MED ORDER — TOPIRAMATE 50 MG PO TABS
50.0000 mg | ORAL_TABLET | Freq: Every day | ORAL | 0 refills | Status: DC
  Filled 2023-10-11: qty 30, 30d supply, fill #0

## 2023-11-12 ENCOUNTER — Other Ambulatory Visit (HOSPITAL_COMMUNITY): Payer: Self-pay

## 2023-11-12 MED ORDER — PHENTERMINE HCL 37.5 MG PO TABS
37.5000 mg | ORAL_TABLET | Freq: Every morning | ORAL | 0 refills | Status: DC
  Filled 2023-11-12: qty 30, 30d supply, fill #0

## 2023-11-12 MED ORDER — TOPIRAMATE 50 MG PO TABS
50.0000 mg | ORAL_TABLET | Freq: Every day | ORAL | 0 refills | Status: DC
  Filled 2023-11-12: qty 30, 30d supply, fill #0

## 2023-11-18 DIAGNOSIS — H61001 Unspecified perichondritis of right external ear: Secondary | ICD-10-CM | POA: Diagnosis not present

## 2023-11-18 DIAGNOSIS — L700 Acne vulgaris: Secondary | ICD-10-CM | POA: Diagnosis not present

## 2023-11-18 DIAGNOSIS — L281 Prurigo nodularis: Secondary | ICD-10-CM | POA: Diagnosis not present

## 2023-12-09 ENCOUNTER — Encounter: Payer: Self-pay | Admitting: Podiatry

## 2023-12-09 ENCOUNTER — Ambulatory Visit (INDEPENDENT_AMBULATORY_CARE_PROVIDER_SITE_OTHER): Payer: 59 | Admitting: Podiatry

## 2023-12-09 DIAGNOSIS — L600 Ingrowing nail: Secondary | ICD-10-CM | POA: Diagnosis not present

## 2023-12-09 NOTE — Progress Notes (Signed)
  Subjective:  Patient ID: Krystal Ferguson, female    DOB: 1991-10-30,   MRN: [id number]  Chief Complaint  Patient presents with   Ingrown Toenail    Pt presents for painful ingrown toenail great toe on the right.    32 y.o. female presents for concern of ingrown toenail on right great toe that has been present for a couple weeks. Has been trying to treat herself by trimming also has concern for possible fungus developing in the area as well.   . Denies any other pedal complaints. Denies n/v/f/c.   Past Medical History:  Diagnosis Date   Chest pain    Family history of adverse reaction to anesthesia    mom nausea and vomitting   Migraine    Polycystic ovaries    Vaginal Pap smear, abnormal     Objective:  Physical Exam: Vascular: DP/PT pulses 2/4 bilateral. CFT <3 seconds. Normal hair growth on digits. No edema.  Skin. No lacerations or abrasions bilateral feet. Incurvation of medial border of right great toe with tenderness. No erythema edema or purulence noted.  Musculoskeletal: MMT 5/5 bilateral lower extremities in DF, PF, Inversion and Eversion. Deceased ROM in DF of ankle joint.  Neurological: Sensation intact to light touch.   Assessment:   1. Ingrown right greater toenail      Plan:  Patient was evaluated and treated and all questions answered. Discussed ingrown toenails etiology and treatment options including procedure for removal vs conservative care.  Patient requesting removal of ingrown nail today. Procedure below.  Discussed procedure and post procedure care and patient expressed understanding.  Will follow-up in 2 weeks for nail check or sooner if any problems arise.    Procedure:  Procedure: partial Nail Avulsion of right hallux medial nail border.  Surgeon: Louann Sjogren, DPM  Pre-op Dx: Ingrown toenail without infection Post-op: Same  Place of Surgery: Office exam room.  Indications for surgery: Painful and ingrown toenail.    The patient is  requesting removal of nail with  chemical matrixectomy. Risks and complications were discussed with the patient for which they understand and written consent was obtained. Under sterile conditions a total of 3 mL of  1% lidocaine plain was infiltrated in a hallux block fashion. Once anesthetized, the skin was prepped in sterile fashion. A tourniquet was then applied. Next the medial aspect of hallux nail border was then sharply excised making sure to remove the entire offending nail border.  Next phenol was then applied under standard conditions to permanently destroy the matrix and copiously irrigated. Silvadene was applied. A dry sterile dressing was applied. After application of the dressing the tourniquet was removed and there is found to be an immediate capillary refill time to the digit. The patient tolerated the procedure well without any complications. Post procedure instructions were discussed the patient for which he verbally understood. Follow-up in two weeks for nail check or sooner if any problems are to arise. Discussed signs/symptoms of infection and directed to call the office immediately should any occur or go directly to the emergency room. In the meantime, encouraged to call the office with any questions, concerns, changes symptoms.   Louann Sjogren, DPM

## 2023-12-09 NOTE — Patient Instructions (Signed)

## 2023-12-16 ENCOUNTER — Other Ambulatory Visit (HOSPITAL_COMMUNITY): Payer: Self-pay

## 2023-12-16 ENCOUNTER — Other Ambulatory Visit: Payer: Self-pay

## 2023-12-16 MED ORDER — TOPIRAMATE 50 MG PO TABS
50.0000 mg | ORAL_TABLET | Freq: Every day | ORAL | 0 refills | Status: DC
  Filled 2023-12-16: qty 30, 30d supply, fill #0

## 2023-12-16 MED ORDER — PHENTERMINE HCL 37.5 MG PO TABS
37.5000 mg | ORAL_TABLET | Freq: Every morning | ORAL | 0 refills | Status: DC
  Filled 2023-12-16: qty 30, 30d supply, fill #0

## 2023-12-16 MED ORDER — PHENTERMINE HCL 37.5 MG PO TABS
37.5000 mg | ORAL_TABLET | Freq: Every day | ORAL | 0 refills | Status: DC
  Filled 2023-12-16 – 2024-05-15 (×2): qty 30, 30d supply, fill #0

## 2023-12-17 ENCOUNTER — Other Ambulatory Visit (HOSPITAL_COMMUNITY): Payer: Self-pay

## 2023-12-23 ENCOUNTER — Ambulatory Visit (INDEPENDENT_AMBULATORY_CARE_PROVIDER_SITE_OTHER): Payer: 59 | Admitting: Podiatry

## 2023-12-23 DIAGNOSIS — L600 Ingrowing nail: Secondary | ICD-10-CM

## 2023-12-23 NOTE — Progress Notes (Signed)
  Subjective:  Patient ID: Krystal Ferguson, female    DOB: [birth year],   MRN: [id number]  No chief complaint on file.   33 y.o. female presents for follow-up of right ingrown toenail procedure. Relates doing well with minimal pain. It is still sore and has been soaking and using neosporin and bandaid.  . Denies any other pedal complaints. Denies n/v/f/c.   Past Medical History:  Diagnosis Date   Chest pain    Family history of adverse reaction to anesthesia    mom nausea and vomitting   Migraine    Polycystic ovaries    Vaginal Pap smear, abnormal     Objective:  Physical Exam: Vascular: DP/PT pulses 2/4 bilateral. CFT <3 seconds. Normal hair growth on digits. No edema.  Skin. No lacerations or abrasions bilateral feet. Right hallux nail healing well.  Musculoskeletal: MMT 5/5 bilateral lower extremities in DF, PF, Inversion and Eversion. Deceased ROM in DF of ankle joint.  Neurological: Sensation intact to light touch.   Assessment:   1. Ingrown right greater toenail      Plan:  Patient was evaluated and treated and all questions answered. Toe was evaluated and appears to be healing well.  May discontinue soaks and neosporin.  Patient to follow-up as needed.    Asberry Failing, DPM

## 2024-01-02 ENCOUNTER — Other Ambulatory Visit (HOSPITAL_COMMUNITY): Payer: Self-pay

## 2024-01-02 DIAGNOSIS — L700 Acne vulgaris: Secondary | ICD-10-CM | POA: Diagnosis not present

## 2024-01-02 MED ORDER — DOXYCYCLINE MONOHYDRATE 100 MG PO CAPS
100.0000 mg | ORAL_CAPSULE | Freq: Every day | ORAL | 1 refills | Status: DC
  Filled 2024-01-02 – 2024-01-07 (×2): qty 30, 30d supply, fill #0
  Filled 2024-05-07: qty 30, 30d supply, fill #1

## 2024-01-02 MED ORDER — TRETINOIN 0.05 % EX CREA
1.0000 | TOPICAL_CREAM | Freq: Every day | CUTANEOUS | 3 refills | Status: DC
  Filled 2024-01-02: qty 20, 30d supply, fill #0

## 2024-01-03 ENCOUNTER — Other Ambulatory Visit (HOSPITAL_COMMUNITY): Payer: Self-pay

## 2024-01-07 ENCOUNTER — Other Ambulatory Visit (HOSPITAL_COMMUNITY): Payer: Self-pay

## 2024-01-13 DIAGNOSIS — H43393 Other vitreous opacities, bilateral: Secondary | ICD-10-CM | POA: Diagnosis not present

## 2024-01-13 DIAGNOSIS — T1511XA Foreign body in conjunctival sac, right eye, initial encounter: Secondary | ICD-10-CM | POA: Diagnosis not present

## 2024-01-27 ENCOUNTER — Other Ambulatory Visit (HOSPITAL_COMMUNITY): Payer: Self-pay

## 2024-01-27 MED ORDER — TOPIRAMATE 50 MG PO TABS
50.0000 mg | ORAL_TABLET | Freq: Every day | ORAL | 0 refills | Status: DC
  Filled 2024-01-27: qty 30, 30d supply, fill #0

## 2024-01-27 MED ORDER — PHENTERMINE HCL 37.5 MG PO TABS
37.5000 mg | ORAL_TABLET | Freq: Every morning | ORAL | 0 refills | Status: DC
  Filled 2024-01-27: qty 30, 30d supply, fill #0

## 2024-02-17 DIAGNOSIS — H43393 Other vitreous opacities, bilateral: Secondary | ICD-10-CM | POA: Diagnosis not present

## 2024-02-26 ENCOUNTER — Other Ambulatory Visit (HOSPITAL_COMMUNITY): Payer: Self-pay

## 2024-02-26 MED ORDER — PHENTERMINE HCL 37.5 MG PO TABS
37.5000 mg | ORAL_TABLET | Freq: Every day | ORAL | 0 refills | Status: DC
  Filled 2024-02-26: qty 30, 30d supply, fill #0

## 2024-02-26 MED ORDER — TOPIRAMATE 50 MG PO TABS
50.0000 mg | ORAL_TABLET | Freq: Every day | ORAL | 0 refills | Status: DC
  Filled 2024-02-26: qty 30, 30d supply, fill #0

## 2024-03-26 ENCOUNTER — Other Ambulatory Visit: Payer: Self-pay

## 2024-03-26 ENCOUNTER — Other Ambulatory Visit (HOSPITAL_COMMUNITY): Payer: Self-pay

## 2024-03-26 DIAGNOSIS — D509 Iron deficiency anemia, unspecified: Secondary | ICD-10-CM | POA: Diagnosis not present

## 2024-03-26 DIAGNOSIS — Z1321 Encounter for screening for nutritional disorder: Secondary | ICD-10-CM | POA: Diagnosis not present

## 2024-03-26 DIAGNOSIS — E569 Vitamin deficiency, unspecified: Secondary | ICD-10-CM | POA: Diagnosis not present

## 2024-03-26 DIAGNOSIS — R202 Paresthesia of skin: Secondary | ICD-10-CM | POA: Diagnosis not present

## 2024-03-26 DIAGNOSIS — G43909 Migraine, unspecified, not intractable, without status migrainosus: Secondary | ICD-10-CM | POA: Diagnosis not present

## 2024-03-26 DIAGNOSIS — K912 Postsurgical malabsorption, not elsewhere classified: Secondary | ICD-10-CM | POA: Diagnosis not present

## 2024-03-26 DIAGNOSIS — R519 Headache, unspecified: Secondary | ICD-10-CM | POA: Diagnosis not present

## 2024-03-26 MED ORDER — TOPIRAMATE 50 MG PO TABS
50.0000 mg | ORAL_TABLET | Freq: Every day | ORAL | 0 refills | Status: DC
  Filled 2024-03-26: qty 30, 30d supply, fill #0

## 2024-03-26 MED ORDER — PHENTERMINE HCL 37.5 MG PO TABS
37.5000 mg | ORAL_TABLET | Freq: Every morning | ORAL | 0 refills | Status: DC
  Filled 2024-03-26: qty 30, 30d supply, fill #0

## 2024-03-27 ENCOUNTER — Other Ambulatory Visit: Payer: Self-pay | Admitting: Physician Assistant

## 2024-03-27 ENCOUNTER — Encounter: Payer: Self-pay | Admitting: Neurology

## 2024-03-27 DIAGNOSIS — R519 Headache, unspecified: Secondary | ICD-10-CM

## 2024-03-30 ENCOUNTER — Ambulatory Visit
Admission: RE | Admit: 2024-03-30 | Discharge: 2024-03-30 | Disposition: A | Discharge status: Home / Self Care | Payer: Self-pay | Source: Ambulatory Visit | Attending: Physician Assistant | Admitting: Physician Assistant

## 2024-03-30 DIAGNOSIS — R519 Headache, unspecified: Secondary | ICD-10-CM | POA: Diagnosis not present

## 2024-05-07 ENCOUNTER — Encounter: Payer: Self-pay | Admitting: Pharmacist

## 2024-05-07 ENCOUNTER — Other Ambulatory Visit (HOSPITAL_COMMUNITY): Payer: Self-pay

## 2024-05-07 ENCOUNTER — Other Ambulatory Visit: Payer: Self-pay

## 2024-05-07 DIAGNOSIS — L700 Acne vulgaris: Secondary | ICD-10-CM | POA: Diagnosis not present

## 2024-05-07 DIAGNOSIS — R21 Rash and other nonspecific skin eruption: Secondary | ICD-10-CM | POA: Diagnosis not present

## 2024-05-07 MED ORDER — TRETINOIN 0.1 % EX CREA
1.0000 | TOPICAL_CREAM | Freq: Every day | CUTANEOUS | 4 refills | Status: AC
  Filled 2024-05-07 (×2): qty 45, 90d supply, fill #0

## 2024-05-08 ENCOUNTER — Other Ambulatory Visit (HOSPITAL_COMMUNITY): Payer: Self-pay

## 2024-05-15 ENCOUNTER — Other Ambulatory Visit (HOSPITAL_COMMUNITY): Payer: Self-pay

## 2024-05-15 MED ORDER — TOPIRAMATE 50 MG PO TABS
50.0000 mg | ORAL_TABLET | Freq: Every day | ORAL | 0 refills | Status: DC
  Filled 2024-05-15: qty 30, 30d supply, fill #0

## 2024-05-15 MED ORDER — PHENTERMINE HCL 37.5 MG PO TABS
37.5000 mg | ORAL_TABLET | Freq: Every morning | ORAL | 0 refills | Status: AC
  Filled 2024-05-15: qty 30, 30d supply, fill #0

## 2024-06-03 ENCOUNTER — Other Ambulatory Visit (HOSPITAL_COMMUNITY): Payer: Self-pay

## 2024-06-03 DIAGNOSIS — G4700 Insomnia, unspecified: Secondary | ICD-10-CM | POA: Diagnosis not present

## 2024-06-03 DIAGNOSIS — F4321 Adjustment disorder with depressed mood: Secondary | ICD-10-CM | POA: Diagnosis not present

## 2024-06-03 MED ORDER — ALPRAZOLAM 0.5 MG PO TABS
0.5000 mg | ORAL_TABLET | Freq: Two times a day (BID) | ORAL | 0 refills | Status: AC | PRN
  Filled 2024-06-03 (×2): qty 60, 30d supply, fill #0

## 2024-06-10 DIAGNOSIS — Z124 Encounter for screening for malignant neoplasm of cervix: Secondary | ICD-10-CM | POA: Diagnosis not present

## 2024-06-10 DIAGNOSIS — Z6835 Body mass index (BMI) 35.0-35.9, adult: Secondary | ICD-10-CM | POA: Diagnosis not present

## 2024-06-10 DIAGNOSIS — Z1151 Encounter for screening for human papillomavirus (HPV): Secondary | ICD-10-CM | POA: Diagnosis not present

## 2024-06-10 DIAGNOSIS — Z01419 Encounter for gynecological examination (general) (routine) without abnormal findings: Secondary | ICD-10-CM | POA: Diagnosis not present

## 2024-06-23 NOTE — Progress Notes (Unsigned)
 NEUROLOGY CONSULTATION NOTE  TAYRA DAWE MRN: [id number] DOB: 23-Nov-1991  Referring provider: Sydelle Close, PA Primary care provider: Joen Gentry, MD  Reason for consult:  headache  Assessment/Plan:   New headache - likely migraine with aura, without status migrainosus, not intractable History of migraine with aura, without status migrainosus, not intractable.     As these are new headaches with new sensory symptoms, check MRI of brain with and without contrast Migraine prevention:  She would like to try supplements:  MigreLief (also consider CoQ10) and lifestyle modification.  If no improvement in 3 months, would increase topiramate  to 75mg  at bedtime Migraine rescue:  Tylenol  Limit use of pain relievers to no more than 9 days out of the month to prevent risk of rebound or medication-overuse headache. Keep headache diary Follow up 8 months.   Subjective:  Krystal Ferguson is a 33 year old right-handed female with PCOS who presents for headache.  History supplemented by referring provider's note.  CT and MRI of head personally reviewed.  Beginning around March 2025, she began experiencing a new type of headache.   She describes a pins and needles sensation starting at the top of her head that can spread down, sometimes to just the ears but sometimes down to both shoulders with photosensitivity.  It lasts 5-10 minutes followed by a dull 2-3/10 throbbing or pressure diffuse headache.  No associated nausea, vomiting, phonophobia, visual disturbance, neck pain or unilateral numbness or weakness.  Lasts around 30 minutes with Tylenol .  Occurs 2 to 3 times a week.  CT head on 03/30/2024 was normal.    She has remote history of migraine with aura.  On 09/05/2013, she was seen in the ED for what was diagnosed as an atypical migraine after waking up with severe bi-occipital sharp headache with photophobia, decreased vision in the left eye, left sided numbness and possibly slight left  sided facial droop.  CT head and follow up MRI of brain at that time revealed minimal punctate T2/FLAIR hyperintensities bilaterally likely secondary to history of migraines as well as a well-defined hyperintense focus within the left parietal white matter, felt to be a Virchow-Robin space.  Sed rate and CRP were 21 and <0.5 respectively.  Follow up MRA of head on 09/29/2013 was normal. Due to atypical presentation, she also had an LP performed on 11/05/2013 which was unremarkable, revealing CSF cell count 1, protein 12, glucose 56, IgG index 1, 2 OCBs (also present in serum), negative Lyme Ab, negative Crypto Ag and negative AFB.  She was later hospitalized on 04/06/2014 for a probable migraine aura with onset of left sided facial and arm numbness, left arm weakness and graying of vision in the left eye.  No associated headache.  MRI of brain was stable compared to prior imaging from October 2014.  Echo and carotid ultrasound were unremarkable.  Reportedly also had an inconsistent exam as well raising possibility of psychogenic etiology.  Migraine aura vas TIA were proposed.  At the time, she had significant anxiety and depression and was evaluated by psychiatry.    She had to go to the ED on 09/07/2021 for severe headache.  CT head was unremarkable.  Responded to headache cocktail.    Past NSAIDS/analgesics:  naproxen , Toradol  IM Past abortive triptans:  sumatriptan  Past abortive ergotamine:  none Past muscle relaxants:  none Past anti-emetic:  Zofran  4mg  Past antihypertensive medications:  none Past antidepressant medications:  sertraline, citalopram  Past anticonvulsant medications:  none Past anti-CGRP:  none Past vitamins/Herbal/Supplements:  none Past antihistamines/decongestants:  chlorpheniramine, Benadryl  Other past therapies:  none  Current NSAIDS/analgesics:  Tylenol  Current triptans:  none Current ergotamine:  none Current anti-emetic:  none Current muscle relaxants:  none Current  Antihypertensive medications:  none Current Antidepressant medications:  none Current Anticonvulsant medications:  topiramate  50mg  daily (for weight loss) Current anti-CGRP:  none Birth control:  none Other medications:  phentermine , alprazolam  0.5mg  BID PRN   Caffeine:  tea two times a week.  No coffee, soda or energy drinks Diet:  water.  Salads, protein (chicken, protein shakes or bars).  Avoids greasy food and limits sugar and carbs. Exercise:  walks every evening.  Elliptical 3 days a week Depression/Anxiety:  She has history of anxiety.  She has increased anxiety and depression since her husband passed away last month. Sleep hygiene:  Usually okay.  Limited due to working, going to school full time and caring for her children.  Poor over past month (4-6 hours a night now with Xanax  if needed). Family history of headache:  no      PAST MEDICAL HISTORY: Past Medical History:  Diagnosis Date   Chest pain    Family history of adverse reaction to anesthesia    mom nausea and vomitting   Migraine    Polycystic ovaries    Vaginal Pap smear, abnormal     PAST SURGICAL HISTORY: Past Surgical History:  Procedure Laterality Date   CESAREAN SECTION N/A 05/09/2017   Procedure: CESAREAN SECTION;  Surgeon: Marget Lenis, MD;  Location: [facility];  Service: Obstetrics;  Laterality: N/A;   CESAREAN SECTION WITH BILATERAL TUBAL LIGATION N/A 01/24/2023   Procedure: REPEAT CESAREAN SECTION WITH BILATERAL TUBAL LIGATION EDC: 01-30-23 ALLERG: NKDA  PREVIOUS X 1;  Surgeon: Marget Lenis, MD;  Location: [facility] LD ORS;  Service: Obstetrics;  Laterality: N/A;   COLPOSCOPY     LAPAROSCOPIC GASTRIC SLEEVE RESECTION     TPN x5 weeks   WISDOM TOOTH EXTRACTION      MEDICATIONS: Current Outpatient Medications on File Prior to Visit  Medication Sig Dispense Refill   acetaminophen  (TYLENOL ) 325 MG tablet Take 2 tablets (650 mg total) by mouth every 4 (four) hours as needed for mild pain (temperature  > 101.5.). 100 tablet 0   ALPRAZolam  (XANAX ) 0.5 MG tablet Take 1 tablet (0.5 mg total) by mouth 2 (two) times daily as needed. 60 tablet 0   calcium  elemental as carbonate (TUMS ULTRA 1000) 400 MG chewable tablet Chew 1,000 mg by mouth daily as needed for heartburn.     citalopram  (CELEXA ) 10 MG tablet Take 1 tablet (10 mg total) by mouth daily. (Patient not taking: Reported on 01/22/2023) 30 tablet 5   doxycycline  (MONODOX ) 100 MG capsule Take 1 capsule (100 mg total) by mouth daily for 2 months. 30 capsule 1   ibuprofen  (ADVIL ) 600 MG tablet Take 1 tablet (600 mg total) by mouth every 6 (six) hours as needed for mild pain, moderate pain or cramping. 30 tablet 0   lidocaine  (LIDODERM ) 5 % Apply 1 patch onto the skin once daily. May wear up to 12 hours. 3 patch 1   phentermine  (ADIPEX-P ) 37.5 MG tablet Take 1 tablet (37.5 mg total) by mouth every morning before breakfast 30 tablet 0   phentermine  (ADIPEX-P ) 37.5 MG tablet Take 1 tablet (37.5 mg total) by mouth daily before breakfast. 30 tablet 0   phentermine  (ADIPEX-P ) 37.5 MG tablet Take 1 tablet (37.5 mg total) by mouth every morning  before breakfast. 30 tablet 0   phentermine  (ADIPEX-P ) 37.5 MG tablet Take 1 tablet (37.5 mg total) by mouth daily before breakfast. 30 tablet 0   phentermine  (ADIPEX-P ) 37.5 MG tablet Take 1 tablet (37.5 mg total) by mouth in the morning before breakfast 30 tablet 0   phentermine  (ADIPEX-P ) 37.5 MG tablet Take 1 tablet (37.5 mg total) by mouth every morning before breakfast. 30 tablet 0   phentermine  37.5 MG capsule Take 1 capsule (37.5 mg total) by mouth in the morning before breakfast 30 capsule 0   phentermine  37.5 MG capsule Take 1 capsule (37.5 mg total) by mouth every morning before breakfast 30 capsule 0   Prenatal Vit-Fe Fumarate-FA (PRENATAL MULTIVITAMIN) TABS tablet Take 2 tablets by mouth at bedtime.     topiramate  (TOPAMAX ) 25 MG tablet Take 1 tablet (25 mg total) by mouth once  daily for 14 days 14 tablet  0   topiramate  (TOPAMAX ) 50 MG tablet Take 1 tablet (50 mg total) by mouth daily. 30 tablet 0   tretinoin  (RETIN-A ) 0.05 % cream Apply 1 Application topically at bedtime (after applying moisturizer). 20 g 3   tretinoin  (RETIN-A ) 0.1 % cream Apply as directed to affected area every night 45 g 4   No current facility-administered medications on file prior to visit.    ALLERGIES: Allergies  Allergen Reactions   Citrus Hives, Rash and Other (See Comments)    FAMILY HISTORY: Family History  Problem Relation Age of Onset   Bladder Cancer Mother    Thyroid  disease Mother    Kidney disease Father    Diverticulitis Father    Heart disease Maternal Grandmother    Thyroid  disease Maternal Grandmother    Hypertension Paternal Grandmother    Breast cancer Paternal Grandmother    Heart disease Paternal Grandfather    Diabetes Paternal Grandfather    Lung cancer Paternal Grandfather    Thyroid  disease Maternal Aunt     Objective:  Blood pressure 123/62, pulse 93, height 5' 3 (1.6 m), weight 200 lb (90.7 kg), SpO2 100%, unknown if currently breastfeeding. General: No acute distress.  Patient appears well-groomed.   Head:  Normocephalic/atraumatic Eyes:  fundi examined but not visualized Neck: supple, no paraspinal tenderness, full range of motion Heart: regular rate and rhythm Neurological Exam: Mental status: alert and oriented to person, place, and time, speech fluent and not dysarthric, language intact. Cranial nerves: CN I: not tested CN II: pupils equal, round and reactive to light, visual fields intact CN III, IV, VI:  full range of motion, no nystagmus, no ptosis CN V: facial sensation intact. CN VII: upper and lower face symmetric CN VIII: hearing intact CN IX, X: gag intact, uvula midline CN XI: sternocleidomastoid and trapezius muscles intact CN XII: tongue midline Bulk & Tone: normal, no fasciculations. Motor:  muscle strength 5/5 throughout Sensation:  Pinprick,  temperature and vibratory sensation intact. Deep Tendon Reflexes:  2+ throughout,  toes downgoing.   Finger to nose testing:  Without dysmetria.   Heel to shin:  Without dysmetria.   Gait:  Normal station and stride.  Romberg negative.    Thank you for allowing me to take part in the care of this patient.  Juliene Dunnings, DO  CC:  Sydelle Close, [place]  Joen Gentry, MD

## 2024-06-24 ENCOUNTER — Encounter: Payer: Self-pay | Admitting: Neurology

## 2024-06-24 ENCOUNTER — Ambulatory Visit (INDEPENDENT_AMBULATORY_CARE_PROVIDER_SITE_OTHER): Payer: Self-pay | Admitting: Neurology

## 2024-06-24 VITALS — BP 123/62 | HR 93 | Ht 63.0 in | Wt 200.0 lb

## 2024-06-24 DIAGNOSIS — G43101 Migraine with aura, not intractable, with status migrainosus: Secondary | ICD-10-CM

## 2024-06-24 NOTE — Patient Instructions (Signed)
  MRI of brain with and without contrast Start MigreLief 1 tablet twice daily; consider CoQ10 300mg  daily.  If no improvement in 3 months, contact me and we can try increasing topiramate  Take Tylenol  but limit use of pain relievers to no more than 9 days out of the month.  These medications include acetaminophen , NSAIDs (ibuprofen /Advil /Motrin , naproxen /Aleve , triptans (Imitrex /sumatriptan ), Excedrin, and narcotics.  This will help reduce risk of rebound headaches. Be aware of common food triggers Routine exercise Stay adequately hydrated (aim for 64 oz water daily) Keep headache diary Maintain proper stress management Maintain proper sleep hygiene Do not skip meals

## 2024-06-25 ENCOUNTER — Ambulatory Visit
Admission: RE | Admit: 2024-06-25 | Discharge: 2024-06-25 | Disposition: A | Discharge status: Home / Self Care | Source: Ambulatory Visit | Attending: Neurology | Admitting: Neurology

## 2024-06-25 DIAGNOSIS — G43101 Migraine with aura, not intractable, with status migrainosus: Secondary | ICD-10-CM

## 2024-06-25 DIAGNOSIS — R9082 White matter disease, unspecified: Secondary | ICD-10-CM | POA: Diagnosis not present

## 2024-06-25 DIAGNOSIS — D354 Benign neoplasm of pineal gland: Secondary | ICD-10-CM | POA: Diagnosis not present

## 2024-06-25 MED ORDER — GADOPICLENOL 0.5 MMOL/ML IV SOLN
10.0000 mL | Freq: Once | INTRAVENOUS | Status: AC | PRN
  Administered 2024-06-25: 10 mL via INTRAVENOUS

## 2024-06-26 ENCOUNTER — Ambulatory Visit: Payer: Self-pay | Admitting: Neurology

## 2024-06-26 NOTE — Telephone Encounter (Signed)
Pt. Called back for results.

## 2024-06-26 NOTE — Progress Notes (Signed)
 LMOVM to call the office [place].

## 2024-06-29 NOTE — Progress Notes (Signed)
 LMOVM to call the office [place].

## 2024-06-29 NOTE — Progress Notes (Signed)
 Patient advised.

## 2024-07-06 ENCOUNTER — Other Ambulatory Visit (HOSPITAL_COMMUNITY): Payer: Self-pay

## 2024-07-07 ENCOUNTER — Other Ambulatory Visit (HOSPITAL_COMMUNITY): Payer: Self-pay

## 2024-07-07 MED ORDER — TOPIRAMATE 50 MG PO TABS
50.0000 mg | ORAL_TABLET | Freq: Every day | ORAL | 0 refills | Status: DC
  Filled 2024-07-07: qty 30, 30d supply, fill #0

## 2024-07-07 MED ORDER — PHENTERMINE HCL 37.5 MG PO TABS
37.5000 mg | ORAL_TABLET | ORAL | 0 refills | Status: AC
  Filled 2024-07-07: qty 30, 30d supply, fill #0

## 2024-08-12 ENCOUNTER — Other Ambulatory Visit (HOSPITAL_COMMUNITY): Payer: Self-pay

## 2024-08-12 MED ORDER — PHENTERMINE HCL 37.5 MG PO TABS
37.5000 mg | ORAL_TABLET | Freq: Every morning | ORAL | 0 refills | Status: AC
  Filled 2024-08-12 – 2024-08-18 (×2): qty 30, 30d supply, fill #0

## 2024-08-12 MED ORDER — TOPIRAMATE 50 MG PO TABS
50.0000 mg | ORAL_TABLET | Freq: Every day | ORAL | 0 refills | Status: DC
  Filled 2024-08-12 – 2024-08-18 (×2): qty 30, 30d supply, fill #0

## 2024-08-18 ENCOUNTER — Other Ambulatory Visit (HOSPITAL_COMMUNITY): Payer: Self-pay

## 2024-08-20 ENCOUNTER — Ambulatory Visit: Payer: Self-pay | Admitting: Neurology

## 2024-09-04 ENCOUNTER — Other Ambulatory Visit (HOSPITAL_COMMUNITY): Payer: Self-pay

## 2024-09-04 DIAGNOSIS — L304 Erythema intertrigo: Secondary | ICD-10-CM | POA: Diagnosis not present

## 2024-09-04 DIAGNOSIS — R7303 Prediabetes: Secondary | ICD-10-CM | POA: Diagnosis not present

## 2024-09-04 DIAGNOSIS — E669 Obesity, unspecified: Secondary | ICD-10-CM | POA: Diagnosis not present

## 2024-09-04 DIAGNOSIS — Z13220 Encounter for screening for lipoid disorders: Secondary | ICD-10-CM | POA: Diagnosis not present

## 2024-09-04 DIAGNOSIS — Z9884 Bariatric surgery status: Secondary | ICD-10-CM | POA: Diagnosis not present

## 2024-09-04 DIAGNOSIS — Z8379 Family history of other diseases of the digestive system: Secondary | ICD-10-CM | POA: Diagnosis not present

## 2024-09-04 DIAGNOSIS — Z0000 Encounter for general adult medical examination without abnormal findings: Secondary | ICD-10-CM | POA: Diagnosis not present

## 2024-09-04 DIAGNOSIS — D509 Iron deficiency anemia, unspecified: Secondary | ICD-10-CM | POA: Diagnosis not present

## 2024-09-04 DIAGNOSIS — E282 Polycystic ovarian syndrome: Secondary | ICD-10-CM | POA: Diagnosis not present

## 2024-09-04 DIAGNOSIS — G43909 Migraine, unspecified, not intractable, without status migrainosus: Secondary | ICD-10-CM | POA: Diagnosis not present

## 2024-09-04 DIAGNOSIS — G4700 Insomnia, unspecified: Secondary | ICD-10-CM | POA: Diagnosis not present

## 2024-09-04 MED ORDER — ALPRAZOLAM 0.5 MG PO TABS
0.5000 mg | ORAL_TABLET | Freq: Two times a day (BID) | ORAL | 0 refills | Status: AC | PRN
  Filled 2024-09-04: qty 60, 30d supply, fill #0

## 2024-09-19 DIAGNOSIS — S1086XA Insect bite of other specified part of neck, initial encounter: Secondary | ICD-10-CM | POA: Diagnosis not present

## 2024-09-20 ENCOUNTER — Emergency Department (HOSPITAL_BASED_OUTPATIENT_CLINIC_OR_DEPARTMENT_OTHER)
Admission: EM | Admit: 2024-09-20 | Discharge: 2024-09-20 | Disposition: A | Discharge status: Home / Self Care | Attending: Emergency Medicine | Admitting: Emergency Medicine

## 2024-09-20 ENCOUNTER — Emergency Department (HOSPITAL_BASED_OUTPATIENT_CLINIC_OR_DEPARTMENT_OTHER)

## 2024-09-20 DIAGNOSIS — L03221 Cellulitis of neck: Secondary | ICD-10-CM | POA: Diagnosis not present

## 2024-09-20 DIAGNOSIS — M799 Soft tissue disorder, unspecified: Secondary | ICD-10-CM | POA: Diagnosis not present

## 2024-09-20 DIAGNOSIS — M542 Cervicalgia: Secondary | ICD-10-CM | POA: Diagnosis present

## 2024-09-20 LAB — BASIC METABOLIC PANEL WITH GFR
Anion gap: 10 (ref 5–15)
BUN: 7 mg/dL (ref 6–20)
CO2: 25 mmol/L (ref 22–32)
Calcium: 9.7 mg/dL (ref 8.9–10.3)
Chloride: 107 mmol/L (ref 98–111)
Creatinine, Ser: 0.83 mg/dL (ref 0.44–1.00)
GFR, Estimated: >60 mL/min (ref >60)
Glucose, Bld: 108 mg/dL — ABNORMAL HIGH (ref 70–99)
Potassium: 3.7 mmol/L (ref 3.5–5.1)
Sodium: 141 mmol/L (ref 135–145)

## 2024-09-20 LAB — CBC WITH DIFFERENTIAL/PLATELET
Abs Immature Granulocytes: 0.03 K/uL (ref 0.00–0.07)
Basophils Absolute: 0 K/uL (ref 0.0–0.1)
Basophils Relative: 0 %
Eosinophils Absolute: 0.1 K/uL (ref 0.0–0.5)
Eosinophils Relative: 1 %
HCT: 36.7 % (ref 36.0–46.0)
Hemoglobin: 11.3 g/dL — ABNORMAL LOW (ref 12.0–15.0)
Immature Granulocytes: 0 %
Lymphocytes Relative: 9 %
Lymphs Abs: 0.9 K/uL (ref 0.7–4.0)
MCH: 26.2 pg (ref 26.0–34.0)
MCHC: 30.8 g/dL (ref 30.0–36.0)
MCV: 85.2 fL (ref 80.0–100.0)
Monocytes Absolute: 0.7 K/uL (ref 0.1–1.0)
Monocytes Relative: 6 %
Neutro Abs: 8.9 K/uL — ABNORMAL HIGH (ref 1.7–7.7)
Neutrophils Relative %: 84 %
Platelets: 239 K/uL (ref 150–400)
RBC: 4.31 MIL/uL (ref 3.87–5.11)
RDW: 15.9 % — ABNORMAL HIGH (ref 11.5–15.5)
WBC: 10.7 K/uL — ABNORMAL HIGH (ref 4.0–10.5)
nRBC: 0 % (ref 0.0–0.2)

## 2024-09-20 MED ORDER — IOHEXOL 300 MG/ML  SOLN
100.0000 mL | Freq: Once | INTRAMUSCULAR | Status: AC | PRN
  Administered 2024-09-20: 80 mL via INTRAVENOUS

## 2024-09-20 MED ORDER — DOXYCYCLINE MONOHYDRATE 100 MG PO CAPS
100.0000 mg | ORAL_CAPSULE | Freq: Two times a day (BID) | ORAL | 0 refills | Status: AC
  Filled 2024-09-20: qty 14, 7d supply, fill #0

## 2024-09-20 NOTE — ED Provider Notes (Signed)
 [place] EMERGENCY DEPARTMENT AT [facility]  Provider Note  CSN: [id number] Arrival date & time: 09/20/24 1135  History Chief Complaint  Patient presents with   Insect Bite    Krystal Ferguson is a 33 y.o. female reports 2 days ago she was outside with kids and felt something bite her on her anterior neck. Had some swelling initially that worsened the following morning prompting an UC visit. They felt this was localized allergic reaction to insect sting, gave her steroid cream. She is already on doxycycline  for cystic acne, but only taking it once daily. She reports worsening swelling and pain to the area today, some pain with swallowing but no difficulty breathing. No fevers or drainage.    Home Medications Prior to Admission medications   Medication Sig Start Date End Date Taking? Authorizing Provider  acetaminophen  (TYLENOL ) 325 MG tablet Take 2 tablets (650 mg total) by mouth every 4 (four) hours as needed for mild pain (temperature > 101.5.). 01/26/23   [name], MD  ALPRAZolam  (XANAX ) 0.5 MG tablet Take 1 tablet (0.5 mg total) by mouth 2 (two) times daily as needed. 06/03/24     ALPRAZolam  (XANAX ) 0.5 MG tablet Take 1 tablet (0.5 mg total) by mouth 2 (two) times daily as needed. 09/04/24     calcium  elemental as carbonate (TUMS ULTRA 1000) 400 MG chewable tablet Chew 1,000 mg by mouth daily as needed for heartburn.    [provider]  doxycycline  (MONODOX ) 100 MG capsule Take 1 capsule (100 mg total) by mouth 2 (two) times daily for 7 days. 09/20/24 09/27/24  Roselyn Carlin NOVAK, MD  lidocaine  (LIDODERM ) 5 % Apply 1 patch onto the skin once daily. May wear up to 12 hours. 02/20/23     phentermine  (ADIPEX-P ) 37.5 MG tablet Take 1 tablet (37.5 mg total) by mouth every morning before breakfast. 05/15/24     phentermine  (ADIPEX-P ) 37.5 MG tablet Take 1 tablet (37.5 mg total) by mouth every morning before breakfast 07/07/24     phentermine  (ADIPEX-P ) 37.5 MG tablet  Take 1 tablet (37.5 mg total) by mouth every morning before breakfast. 08/12/24     topiramate  (TOPAMAX ) 50 MG tablet Take 1 tablet (50 mg total) by mouth daily. 08/12/24     tretinoin  (RETIN-A ) 0.1 % cream Apply as directed to affected area every night 05/07/24        Allergies    Citrus   Review of Systems   Review of Systems Please see HPI for pertinent positives and negatives  Physical Exam BP 119/78 (BP Location: Right Arm)   Pulse (!) 107   Temp 98.3 F (36.8 C)   Resp 20   LMP 09/15/2024 (Exact Date)   SpO2 100%   Breastfeeding No   Physical Exam Vitals and nursing note reviewed.  HENT:     Head: Normocephalic.     Nose: Nose normal.  Eyes:     Extraocular Movements: Extraocular movements intact.  Neck:     Comments: Large area of induration, erythema and warmth to R anterior neck, no fluctuance Pulmonary:     Effort: Pulmonary effort is normal.     Breath sounds: No stridor.  Musculoskeletal:        General: Normal range of motion.     Cervical back: Neck supple.  Skin:    Findings: No rash (on exposed skin).  Neurological:     Mental Status: She is alert and oriented to person, place, and time.  Psychiatric:  Mood and Affect: Mood normal.     ED Results / Procedures / Treatments   EKG None  Procedures Procedures  Medications Ordered in the ED Medications  iohexol  (OMNIPAQUE ) 300 MG/ML solution 100 mL (80 mLs Intravenous Contrast Given 09/20/24 1312)    Initial Impression and Plan  Patient here with worsening area of swelling under her neck, some pain with swallowing. Vitals are reassuring but concern for developing infection or deep space abscess. Will check labs and send for CT. Currently on oral Abx but not at treatment dose for acute infection.   ED Course   Clinical Course as of 09/20/24 1447  Sun Sep 20, 2024  1244 CBC with mild leukocytosis, otherwise unremarkable.  [CS]  1302 BMP is normal.  [CS]  1443 I personally viewed the  images from radiology studies and agree with radiologist interpretation: CT is neg for abscess, consistent with uncomplicated cellulitis. Will have her increase doxycycline  to full BID dosing. Warm compresses, OTC analgesia, PCP follow up if not improving. RTED for any other concerns.  [CS]    Clinical Course User Index [CS] Roselyn Carlin NOVAK, MD     MDM Rules/Calculators/A&P Medical Decision Making Problems Addressed: Cellulitis of neck: acute illness or injury  Amount and/or Complexity of Data Reviewed Labs: ordered. Decision-making details documented in ED Course. Radiology: ordered and independent interpretation performed. Decision-making details documented in ED Course.  Risk Prescription drug management.     Final Clinical Impression(s) / ED Diagnoses Final diagnoses:  Cellulitis of neck    Rx / DC Orders ED Discharge Orders          Ordered    doxycycline  (MONODOX ) 100 MG capsule  2 times daily        09/20/24 1447             Roselyn Carlin NOVAK, MD 09/20/24 1447

## 2024-09-20 NOTE — ED Triage Notes (Signed)
 Pt reports swelling and redness to upper neck after being bitten by an unknown bug on Friday evening.  Pt seen in UC and since has been using abx cream and doxycycline  (that she'd been taking for acne flares) with sx continuing to worsen.  Visible swelling noted to  front of neck around site of bite.

## 2024-09-21 ENCOUNTER — Other Ambulatory Visit (HOSPITAL_COMMUNITY): Payer: Self-pay

## 2024-09-24 ENCOUNTER — Other Ambulatory Visit (HOSPITAL_COMMUNITY): Payer: Self-pay

## 2024-09-24 DIAGNOSIS — B3731 Acute candidiasis of vulva and vagina: Secondary | ICD-10-CM | POA: Diagnosis not present

## 2024-09-24 DIAGNOSIS — L03221 Cellulitis of neck: Secondary | ICD-10-CM | POA: Diagnosis not present

## 2024-09-24 DIAGNOSIS — M542 Cervicalgia: Secondary | ICD-10-CM | POA: Diagnosis not present

## 2024-09-24 MED ORDER — FLUCONAZOLE 150 MG PO TABS
150.0000 mg | ORAL_TABLET | ORAL | 1 refills | Status: AC
  Filled 2024-09-24: qty 2, 6d supply, fill #0
  Filled 2024-09-29: qty 2, 6d supply, fill #1

## 2024-09-24 MED ORDER — TRAMADOL HCL 50 MG PO TABS
50.0000 mg | ORAL_TABLET | Freq: Three times a day (TID) | ORAL | 1 refills | Status: AC | PRN
  Filled 2024-09-24: qty 21, 7d supply, fill #0
  Filled 2024-10-10: qty 21, 7d supply, fill #1

## 2024-09-28 ENCOUNTER — Other Ambulatory Visit: Payer: Self-pay | Admitting: Family Medicine

## 2024-09-28 DIAGNOSIS — L0390 Cellulitis, unspecified: Secondary | ICD-10-CM

## 2024-09-29 ENCOUNTER — Encounter (HOSPITAL_BASED_OUTPATIENT_CLINIC_OR_DEPARTMENT_OTHER): Payer: Self-pay | Admitting: *Deleted

## 2024-09-29 ENCOUNTER — Emergency Department (HOSPITAL_BASED_OUTPATIENT_CLINIC_OR_DEPARTMENT_OTHER)
Admission: EM | Admit: 2024-09-29 | Discharge: 2024-09-30 | Disposition: A | Discharge status: Home / Self Care | Attending: Emergency Medicine | Admitting: Emergency Medicine

## 2024-09-29 ENCOUNTER — Ambulatory Visit
Admission: RE | Admit: 2024-09-29 | Discharge: 2024-09-29 | Disposition: A | Discharge status: Home / Self Care | Source: Ambulatory Visit | Attending: Family Medicine | Admitting: Family Medicine

## 2024-09-29 ENCOUNTER — Other Ambulatory Visit: Payer: Self-pay

## 2024-09-29 ENCOUNTER — Emergency Department (HOSPITAL_BASED_OUTPATIENT_CLINIC_OR_DEPARTMENT_OTHER)

## 2024-09-29 DIAGNOSIS — L0291 Cutaneous abscess, unspecified: Secondary | ICD-10-CM

## 2024-09-29 DIAGNOSIS — R000 Tachycardia, unspecified: Secondary | ICD-10-CM | POA: Diagnosis not present

## 2024-09-29 DIAGNOSIS — L0211 Cutaneous abscess of neck: Secondary | ICD-10-CM | POA: Diagnosis not present

## 2024-09-29 DIAGNOSIS — L03221 Cellulitis of neck: Secondary | ICD-10-CM | POA: Diagnosis not present

## 2024-09-29 DIAGNOSIS — R221 Localized swelling, mass and lump, neck: Secondary | ICD-10-CM | POA: Diagnosis not present

## 2024-09-29 DIAGNOSIS — L0390 Cellulitis, unspecified: Secondary | ICD-10-CM

## 2024-09-29 LAB — BASIC METABOLIC PANEL WITH GFR
Anion gap: 10 (ref 5–15)
BUN: 10 mg/dL (ref 6–20)
CO2: 26 mmol/L (ref 22–32)
Calcium: 9.7 mg/dL (ref 8.9–10.3)
Chloride: 104 mmol/L (ref 98–111)
Creatinine, Ser: 0.98 mg/dL (ref 0.44–1.00)
GFR, Estimated: >60 mL/min
Glucose, Bld: 94 mg/dL (ref 70–99)
Potassium: 3.8 mmol/L (ref 3.5–5.1)
Sodium: 140 mmol/L (ref 135–145)

## 2024-09-29 LAB — CBC
HCT: 39.5 % (ref 36.0–46.0)
Hemoglobin: 12 g/dL (ref 12.0–15.0)
MCH: 25.9 pg — ABNORMAL LOW (ref 26.0–34.0)
MCHC: 30.4 g/dL (ref 30.0–36.0)
MCV: 85.1 fL (ref 80.0–100.0)
Platelets: 305 10*3/uL (ref 150–400)
RBC: 4.64 MIL/uL (ref 3.87–5.11)
RDW: 15.5 % (ref 11.5–15.5)
WBC: 10.2 10*3/uL (ref 4.0–10.5)
nRBC: 0 % (ref 0.0–0.2)

## 2024-09-29 LAB — HCG, SERUM, QUALITATIVE: Preg, Serum: NEGATIVE

## 2024-09-29 MED ORDER — LIDOCAINE-EPINEPHRINE-TETRACAINE (LET) TOPICAL GEL
3.0000 mL | Freq: Once | TOPICAL | Status: AC
  Administered 2024-09-29: 3 mL via TOPICAL
  Filled 2024-09-29: qty 3

## 2024-09-29 MED ORDER — LIDOCAINE-EPINEPHRINE (PF) 2 %-1:200000 IJ SOLN
INTRAMUSCULAR | Status: AC
  Filled 2024-09-29: qty 20

## 2024-09-29 MED ORDER — LIDOCAINE-EPINEPHRINE 2 %-1:100000 IJ SOLN
20.0000 mL | Freq: Once | INTRAMUSCULAR | Status: DC
  Filled 2024-09-29: qty 20.4

## 2024-09-29 MED ORDER — IOHEXOL 300 MG/ML  SOLN
75.0000 mL | Freq: Once | INTRAMUSCULAR | Status: AC | PRN
  Administered 2024-09-29: 75 mL via INTRAVENOUS

## 2024-09-29 NOTE — ED Triage Notes (Signed)
 Pt is here for re-evaluation of pain and swelling to her neck.  Pt states that she felt herself being bitten on 10/3 and was seen 10/4 at [facility] and started on doxycycline  and was then seen on 10/5 and had a CT of her neck and given rocephin shot.  Pt reports that that the cellulitis has gotten better but the lump itself has gotten much bigger in the past 48 hours.  Pt states that it is painful to swallow.  Site is not draining.  No fever, pt has had some chills today.  Picture taken in triage and placed on chart.

## 2024-09-29 NOTE — ED Provider Triage Note (Signed)
 Emergency Medicine Provider Triage Evaluation Note  Krystal Ferguson , a 33 y.o. female  was evaluated in triage.  Pt complains of neck infection.  Review of Systems  Positive: Large abscess anterior neck, dysphagia Negative: fever  Physical Exam  BP 136/78 (BP Location: Left Arm)   Pulse (!) 103   Temp 98.6 F (37 C) (Oral)   Resp 18   LMP 09/15/2024 (Exact Date)   SpO2 100%  Gen:   Awake, no distress   Resp:  Normal effort  MSK:   Moves extremities without difficulty  Other:  See triage photo in chart  Medical Decision Making  Medically screening exam initiated at 8:52 PM.  Appropriate orders placed.  Krystal Ferguson was informed that the remainder of the evaluation will be completed by another provider, this initial triage assessment does not replace that evaluation, and the importance of remaining in the ED until their evaluation is complete.  Patient returns to ED with worsening anterior neck swelling. Seen 10/4 and 10/5 with cellulitis and insect bite, has been on Doxycycline  since with improvement to the redness and generalized swelling. Today had US  showing multi-loculated abscess. She comes back to ED with worsening pain at the site and a sense of difficulty swallowing described as being able to pass solids and liquids but is increasingly painful.    Krystal Balls, PA-C 09/29/24 2055

## 2024-09-29 NOTE — ED Provider Notes (Signed)
 [place] EMERGENCY DEPARTMENT AT [facility] Provider Note   CSN: [id number] Arrival date & time: 09/29/24  2014     Patient presents with: Insect Bite   Krystal Ferguson is a 33 y.o. female.  {Add pertinent medical, surgical, social history, OB history to HPI:32947} HPI     This is a 33 year old female who presents with concern for neck infection.  Patient states that she was bitten by something on 10/3.  She subsequently developed cellulitis of the neck and was treated with doxycycline .  She had progressive symptoms and was seen here and had imaging on 10/5 which confirmed localized cellulitis.  She has been on doxycycline  since.  She states that in general the diffuse swelling and redness has improved; however the area where the bite was has gotten hard and larger.  It has not drained anything.  She had an ultrasound earlier yesterday which confirmed an abscess.  She states that she has significant pain with swallowing but is able to swallow.  No fevers.  Prior to Admission medications   Medication Sig Start Date End Date Taking? Authorizing Provider  acetaminophen  (TYLENOL ) 325 MG tablet Take 2 tablets (650 mg total) by mouth every 4 (four) hours as needed for mild pain (temperature > 101.5.). 01/26/23   [name], MD  ALPRAZolam  (XANAX ) 0.5 MG tablet Take 1 tablet (0.5 mg total) by mouth 2 (two) times daily as needed. 06/03/24     ALPRAZolam  (XANAX ) 0.5 MG tablet Take 1 tablet (0.5 mg total) by mouth 2 (two) times daily as needed. 09/04/24     calcium  elemental as carbonate (TUMS ULTRA 1000) 400 MG chewable tablet Chew 1,000 mg by mouth daily as needed for heartburn.    [provider]  doxycycline  (MONODOX ) 100 MG capsule Take 1 capsule (100 mg total) by mouth 2 (two) times daily for 7 days. 09/20/24 10/01/24  Roselyn Carlin NOVAK, MD  fluconazole (DIFLUCAN) 150 MG tablet Take 1 tablet (150 mg total) by mouth every 3 (three) days. 09/24/24     lidocaine   (LIDODERM ) 5 % Apply 1 patch onto the skin once daily. May wear up to 12 hours. 02/20/23     phentermine  (ADIPEX-P ) 37.5 MG tablet Take 1 tablet (37.5 mg total) by mouth every morning before breakfast. 05/15/24     phentermine  (ADIPEX-P ) 37.5 MG tablet Take 1 tablet (37.5 mg total) by mouth every morning before breakfast 07/07/24     phentermine  (ADIPEX-P ) 37.5 MG tablet Take 1 tablet (37.5 mg total) by mouth every morning before breakfast. 08/12/24     topiramate  (TOPAMAX ) 50 MG tablet Take 1 tablet (50 mg total) by mouth daily. 08/12/24     traMADol  (ULTRAM ) 50 MG tablet Take 1 tablet (50 mg total) by mouth every 8 (eight) hours as needed. 09/24/24     tretinoin  (RETIN-A ) 0.1 % cream Apply as directed to affected area every night 05/07/24       Allergies: Citrus    Review of Systems  Constitutional:  Negative for fever.  Musculoskeletal:  Positive for neck pain.  Skin:  Positive for color change.       Abscess  All other systems reviewed and are negative.   Updated Vital Signs BP 136/78 (BP Location: Left Arm)   Pulse (!) 103   Temp 98.6 F (37 C) (Oral)   Resp 18   LMP 09/15/2024 (Exact Date)   SpO2 100%   Physical Exam Vitals and nursing note reviewed.  Constitutional:  Appearance: She is well-developed. She is not ill-appearing.     Comments: ABCs intact  HENT:     Head: Normocephalic and atraumatic.     Mouth/Throat:     Mouth: Mucous membranes are moist.  Eyes:     Pupils: Pupils are equal, round, and reactive to light.  Neck:     Comments: Focused examination of the anterior neck with large 4 x 2 cm area of induration and fluctuance just right of midline, slight erythema Cardiovascular:     Rate and Rhythm: Normal rate and regular rhythm.  Pulmonary:     Effort: Pulmonary effort is normal. No respiratory distress.  Abdominal:     Palpations: Abdomen is soft.  Musculoskeletal:     Cervical back: Normal range of motion and neck supple.  Skin:    General: Skin is  warm and dry.  Neurological:     Mental Status: She is alert and oriented to person, place, and time.  Psychiatric:        Mood and Affect: Mood normal.     (all labs ordered are listed, but only abnormal results are displayed) Labs Reviewed  CBC - Abnormal; Notable for the following components:      Result Value   MCH 25.9 (*)    All other components within normal limits  BASIC METABOLIC PANEL WITH GFR  HCG, SERUM, QUALITATIVE    EKG: None  Radiology: CT Soft Tissue Neck W Contrast Result Date: 09/29/2024 EXAM: CT NECK WITH CONTRAST 09/29/2024 09:34:08 PM TECHNIQUE: CT of the neck was performed with the administration of 75 mL of iohexol  (OMNIPAQUE ) 300 MG/ML solution. Multiplanar reformatted images are provided for review. Automated exposure control, iterative reconstruction, and/or weight based adjustment of the mA/kV was utilized to reduce the radiation dose to as low as reasonably achievable. COMPARISON: None available. CLINICAL HISTORY: Soft tissue infection suspected, neck, xray done. Triage note: Patient returns to ED with worsening anterior neck swelling. Seen 10/4 and 10/5 with cellulitis and insect bite, has been on Doxycycline  since with improvement to the redness and generalized swelling. Today had US  showing multi-loculated abscess. She comes back to ED with worsening pain at the site and a sense of difficulty swallowing described as being able to pass solids and liquids but is increasingly painful. FINDINGS: AERODIGESTIVE TRACT: No discrete mass. No edema. SALIVARY GLANDS: The parotid and submandibular glands are unremarkable. THYROID : Unremarkable. LYMPH NODES: Few mildly prominent submental lymph nodes noted, likely reactive. SOFT TISSUES: Soft tissue swelling with hazy inflammatory stranding seen involving the right anterior neck at the level of the hyoid concerning for localized infection/cellulitis. Superimposed hypodense collection just deep to the skin at this location  measures 3.0 x 1.3 x 1.8 cm, suspicious for abscess (series 5, image 75). BRAIN, ORBITS, SINUSES AND MASTOIDS: No acute abnormality. LUNGS AND MEDIASTINUM: No acute abnormality. BONES: No focal bone abnormality. IMPRESSION: 1. Right anterior neck soft tissue swelling with inflammatory stranding, suggestive of cellulitis 2. Superimposed 3.0 x 1.3 x 1.8 cm subcutaneous hypodense collection. suspicious for abscess. Electronically signed by: Morene Hoard MD 09/29/2024 10:05 PM EDT RP Workstation: [id number]   US  SOFT TISSUE HEAD & NECK (NON-THYROID ) Result Date: 09/29/2024 CLINICAL DATA:  Acute cellulitis to the anterior neck after spider bite EXAM: ULTRASOUND OF HEAD/NECK SOFT TISSUES TECHNIQUE: Ultrasound examination of the head and neck soft tissues was performed in the area of clinical concern. COMPARISON:  None Available. FINDINGS: Focal sonographic interrogation of the region of clinical concern demonstrates extensive soft tissue  thickening and an irregular complex fluid collection measuring 3.2 x 1.3 x 2.1 cm in the superficial soft tissues. Findings are most consistent with abscess. IMPRESSION: Extensive soft tissue thickening consistent with the clinical history of cellulitis. There is an irregular complex fluid collection within the soft tissues at the region of clinical concern highly concerning for abscess. Electronically Signed   By: Wilkie Lent M.D.   On: 09/29/2024 14:31    {Document cardiac monitor, telemetry assessment procedure when appropriate:32947} Procedures   Medications Ordered in the ED  lidocaine -EPINEPHrine (XYLOCAINE  W/EPI) 2 %-1:100000 (with pres) injection 20 mL (has no administration in time range)  lidocaine -EPINEPHrine (XYLOCAINE  W/EPI) 2 %-1:200000 (PF) injection (has no administration in time range)  iohexol  (OMNIPAQUE ) 300 MG/ML solution 75 mL (75 mLs Intravenous Contrast Given 09/29/24 2134)  lidocaine -EPINEPHrine-tetracaine (LET) topical gel (3 mLs Topical  Given 09/29/24 2316)      {Click here for ABCD2, HEART and other calculators REFRESH Note before signing:1}                              Medical Decision Making Amount and/or Complexity of Data Reviewed Labs: ordered.  Risk Prescription drug management.   ***  {Document critical care time when appropriate  Document review of labs and clinical decision tools ie CHADS2VASC2, etc  Document your independent review of radiology images and any outside records  Document your discussion with family members, caretakers and with consultants  Document social determinants of health affecting pt's care  Document your decision making why or why not admission, treatments were needed:32947:::1}   Final diagnoses:  None    ED Discharge Orders     None

## 2024-09-30 ENCOUNTER — Other Ambulatory Visit (HOSPITAL_COMMUNITY): Payer: Self-pay

## 2024-09-30 ENCOUNTER — Encounter (INDEPENDENT_AMBULATORY_CARE_PROVIDER_SITE_OTHER): Payer: Self-pay | Admitting: Otolaryngology

## 2024-09-30 ENCOUNTER — Ambulatory Visit (INDEPENDENT_AMBULATORY_CARE_PROVIDER_SITE_OTHER): Admitting: Otolaryngology

## 2024-09-30 VITALS — BP 118/80 | HR 81 | Temp 98.9°F | Ht 63.0 in | Wt 190.0 lb

## 2024-09-30 DIAGNOSIS — L0211 Cutaneous abscess of neck: Secondary | ICD-10-CM

## 2024-09-30 DIAGNOSIS — R590 Localized enlarged lymph nodes: Secondary | ICD-10-CM

## 2024-09-30 MED ORDER — TOPIRAMATE 50 MG PO TABS
50.0000 mg | ORAL_TABLET | Freq: Every day | ORAL | 0 refills | Status: AC
  Filled 2024-09-30 (×2): qty 30, 30d supply, fill #0

## 2024-09-30 MED ORDER — PHENTERMINE HCL 37.5 MG PO TABS
37.5000 mg | ORAL_TABLET | Freq: Every morning | ORAL | 0 refills | Status: AC
  Filled 2024-09-30 (×2): qty 30, 30d supply, fill #0

## 2024-09-30 MED ORDER — AMOXICILLIN-POT CLAVULANATE 875-125 MG PO TABS
1.0000 | ORAL_TABLET | Freq: Two times a day (BID) | ORAL | 0 refills | Status: DC
  Filled 2024-09-30: qty 20, 10d supply, fill #0

## 2024-09-30 NOTE — Progress Notes (Signed)
 ENT CONSULT:  Reason for Consult: neck abscess, s/p I&D in ED   HPI: Discussed the use of AI scribe software for clinical note transcription with the patient, who gave verbal consent to proceed.  History of Present Illness Krystal Ferguson is a 33 year old female who presents with a draining neck abscess.  She initially sought treatment in the emergency room where the abscess was lanced last night, providing significant relief despite numbness in the area. Since the procedure, the abscess has been actively draining, necessitating multiple bandage changes due to persistent drainage.  The symptoms began on September 18, 2024, and have persisted. Despite the drainage, she feels the area remains somewhat full. The drainage is primarily blood-tinged secretions rather than pus.  Her past medical history includes a previous brown recluse spider bite on her arm, which also resulted in abscess formation. She is currently completing a course of doxycycline , prescribed on September 20, 2024, for ten days, ending today.  No fever or other systemic symptoms were reported during the review of symptoms.   Records Reviewed:  ED visit yesterday  This is a 33 year old female who presents with concern for neck infection. Patient states that she was bitten by something on 10/3. She subsequently developed cellulitis of the neck and was treated with doxycycline . She had progressive symptoms and was seen here and had imaging on 10/5 which confirmed localized cellulitis. She has been on doxycycline  since. She states that in general the diffuse swelling and redness has improved; however the area where the bite was has gotten hard and larger. It has not drained anything. She had an ultrasound earlier yesterday which confirmed an abscess. She states that she has significant pain with swallowing but is able to swallow. No fevers.    I&D was performed with good resolution and drainage of the abscess. She will continue  antibiotics and follow-up with ENT as an outpatient.   Past Medical History:  Diagnosis Date   Chest pain    Family history of adverse reaction to anesthesia    mom nausea and vomitting   Migraine    Polycystic ovaries    Vaginal Pap smear, abnormal     Past Surgical History:  Procedure Laterality Date   CESAREAN SECTION N/A 05/09/2017   Procedure: CESAREAN SECTION;  Surgeon: Marget Lenis, MD;  Location: [facility] BIRTHING SUITES;  Service: Obstetrics;  Laterality: N/A;   CESAREAN SECTION WITH BILATERAL TUBAL LIGATION N/A 01/24/2023   Procedure: REPEAT CESAREAN SECTION WITH BILATERAL TUBAL LIGATION EDC: 01-30-23 ALLERG: NKDA  PREVIOUS X 1;  Surgeon: Marget Lenis, MD;  Location: [facility] LD ORS;  Service: Obstetrics;  Laterality: N/A;   COLPOSCOPY     LAPAROSCOPIC GASTRIC SLEEVE RESECTION     TPN x5 weeks   WISDOM TOOTH EXTRACTION      Family History  Problem Relation Age of Onset   Bladder Cancer Mother    Thyroid  disease Mother    Kidney disease Father    Diverticulitis Father    Heart disease Maternal Grandmother    Thyroid  disease Maternal Grandmother    Hypertension Paternal Grandmother    Breast cancer Paternal Grandmother    Heart disease Paternal Grandfather    Diabetes Paternal Grandfather    Lung cancer Paternal Grandfather    Thyroid  disease Maternal Aunt     Social History:  reports that she has never smoked. She has never been exposed to tobacco smoke. She has never used smokeless tobacco. She reports that she does not currently use  alcohol. She reports that she does not use drugs.  Allergies:  Allergies  Allergen Reactions   Citrus Hives, Rash and Other (See Comments)    Medications: I have reviewed the patient's current medications.  The PMH, PSH, Medications, Allergies, and SH were reviewed and updated.  ROS: Constitutional: Negative for fever, weight loss and weight gain. Cardiovascular: Negative for chest pain and dyspnea on exertion. Respiratory: Is not  experiencing shortness of breath at rest. Gastrointestinal: Negative for nausea and vomiting. Neurological: Negative for headaches. Psychiatric: The patient is not nervous/anxious  Blood pressure 118/80, pulse 81, temperature 98.9 F (37.2 C), height 5' 3 (1.6 m), weight 190 lb (86.2 kg), last menstrual period 09/15/2024, SpO2 99%, not currently breastfeeding. Body mass index is 33.66 kg/m.  PHYSICAL EXAM:  Exam: General: Well-developed, well-nourished Respiratory Respiratory effort: Equal inspiration and expiration without stridor Cardiovascular Peripheral Vascular: Warm extremities with equal color/perfusion Eyes: No nystagmus with equal extraocular motion bilaterally Neuro/Psych/Balance: Patient oriented to person, place, and time; Appropriate mood and affect; Gait is intact with no imbalance; Cranial nerves I-XII are intact Head and Face Inspection: Normocephalic and atraumatic without mass or lesion Palpation: Facial skeleton intact without bony stepoffs Salivary Glands: No mass or tenderness Facial Strength: Facial motility symmetric and full bilaterally ENT Pinna: External ear intact and fully developed External canal: Canal is patent with intact skin Tympanic Membrane: Clear and mobile External Nose: No scar or anatomic deformity Lips, Teeth, and gums: Mucosa and teeth intact and viable TMJ: No pain to palpation with full mobility Oral cavity/oropharynx: No erythema or exudate, no lesions present Nasopharynx: No mass or lesion with intact mucosa Hypopharynx: Intact mucosa without pooling of secretions Larynx Glottic: Full true vocal cord mobility without lesion or mass Supraglottic: Normal appearing epiglottis and AE folds Interarytenoid Space: Moderate pachydermia&edema Subglottic Space: Patent without lesion or edema Neck Neck and Trachea: Midline trachea without mass or lesion there is some fullness along anterior upper neck near hyoid, but no fluctuance, no  erythema, packing removed, when palpated only blood tinged secretions were expressed Thyroid : No mass or nodularity Lymphatics: No lymphadenopathy  Studies Reviewed: CT neck w/con 09/29/24 IMPRESSION: 1. Right anterior neck soft tissue swelling with inflammatory stranding, suggestive of cellulitis 2. Superimposed 3.0 x 1.3 x 1.8 cm subcutaneous hypodense collection. suspicious for abscess.  Neck U/S 09/29/24 Focal sonographic interrogation of the region of clinical concern demonstrates extensive soft tissue thickening and an irregular complex fluid collection measuring 3.2 x 1.3 x 2.1 cm in the superficial soft tissues. Findings are most consistent with abscess.   IMPRESSION: Extensive soft tissue thickening consistent with the clinical history of cellulitis.   There is an irregular complex fluid collection within the soft tissues at the region of clinical concern highly concerning for abscess.    Assessment/Plan: Encounter Diagnoses  Name Primary?   Neck abscess Yes   Cervical lymphadenopathy     Assessment and Plan Assessment & Plan Superficial neck abscess (likely lymph node) The abscess, likely from a lymph node, was lanced in the ER, providing relief. It is superficial, not attached to deeper structures, and not a thyroglossal duct cyst based on anatomic location. Fullness likely due to scar tissue, expected to resolve. No concern for persistent infection; drainage mostly blood-tinged secretions. - Augmentin for 10 days. S/p Doxy  - Recommend gentle massage to aid drainage. - Schedule follow-up in two weeks for wound check       Thank you for allowing me to participate in the care of  this patient. Please do not hesitate to contact me with any questions or concerns.   Elena Larry, MD Otolaryngology [facility] Phone: [phone] Fax: [phone]    09/30/2024, 3:35 PM

## 2024-09-30 NOTE — Discharge Instructions (Signed)
 You were seen today and found to have an abscess of the neck.  This appeared to be limited to the subcutaneous tissues.  This was drained in the emergency department.  Keep packing in place for the next 2 days and finish your course of antibiotics.  ENT follow-up provided.  If you note redness, reaccumulation of abscess, any new or worsening symptoms, you should be reevaluated.

## 2024-10-05 DIAGNOSIS — Z8379 Family history of other diseases of the digestive system: Secondary | ICD-10-CM | POA: Diagnosis not present

## 2024-10-05 DIAGNOSIS — R939 Diagnostic imaging inconclusive due to excess body fat of patient: Secondary | ICD-10-CM | POA: Diagnosis not present

## 2024-10-05 DIAGNOSIS — R932 Abnormal findings on diagnostic imaging of liver and biliary tract: Secondary | ICD-10-CM | POA: Diagnosis not present

## 2024-10-06 ENCOUNTER — Telehealth (INDEPENDENT_AMBULATORY_CARE_PROVIDER_SITE_OTHER): Payer: Self-pay | Admitting: Otolaryngology

## 2024-10-06 NOTE — Telephone Encounter (Signed)
 Returned patient's voicemail to get her appointment rescheduled, requested that she call back and we would be glad to get her rescheduled.

## 2024-10-07 ENCOUNTER — Other Ambulatory Visit (HOSPITAL_COMMUNITY): Payer: Self-pay

## 2024-10-07 MED ORDER — WEGOVY 0.25 MG/0.5ML ~~LOC~~ SOAJ
0.2500 mg | SUBCUTANEOUS | 3 refills | Status: AC
  Filled 2024-10-07: qty 2, 28d supply, fill #0

## 2024-10-10 ENCOUNTER — Other Ambulatory Visit (HOSPITAL_COMMUNITY): Payer: Self-pay

## 2024-10-12 ENCOUNTER — Other Ambulatory Visit: Payer: Self-pay

## 2024-10-15 ENCOUNTER — Encounter (INDEPENDENT_AMBULATORY_CARE_PROVIDER_SITE_OTHER): Payer: Self-pay

## 2024-10-15 ENCOUNTER — Ambulatory Visit (INDEPENDENT_AMBULATORY_CARE_PROVIDER_SITE_OTHER)

## 2024-10-15 ENCOUNTER — Other Ambulatory Visit (HOSPITAL_COMMUNITY): Payer: Self-pay

## 2024-10-15 ENCOUNTER — Ambulatory Visit (INDEPENDENT_AMBULATORY_CARE_PROVIDER_SITE_OTHER): Admitting: Otolaryngology

## 2024-10-15 VITALS — BP 115/81 | HR 90

## 2024-10-15 DIAGNOSIS — L0211 Cutaneous abscess of neck: Secondary | ICD-10-CM

## 2024-10-15 NOTE — Progress Notes (Signed)
 Dear Dr. Loreli, Here is my assessment for our mutual patient, Krystal Ferguson. Thank you for allowing me the opportunity to care for your patient. Please do not hesitate to contact me should you have any other questions. Sincerely, Dr. Hadassah Parody  Otolaryngology Clinic Note Referring provider: Dr. Loreli HPI:   Initial HPI Krystal Ferguson - 09/30/24) Krystal Ferguson is a 33 y.o. female with hx of neck abscess drained by Dr. Okey.   She was seen in ED on 09/29/24 and had small abscess after being bitten by something on 10/3. Subsequently developed neck cellulitis. Despite drainage the next day she felt the area was still full. Dr. Soldatova put her on augmentin for 10 days and had her f/u in clinic.   --------------------------------------------------------- 10/15/2024  She is here today for f/u.   Krystal Ferguson is a 33 year old female who presents with a persistent neck abscess following a bug bite.  Neck abscess - Onset following a bug bite on September 18, 2024 - Initially large and painful, described as resembling a 'golf ball' in the neck - Significant discomfort and swelling at onset - Abscess was drained in the emergency department with removal of a large amount of pus - was not cultured  - Completed a course of Augmentin - Persistent small amount of clear, sometimes blood-tinged discharge that occurs only when she squeezes it - she was instructed to squeeze the area daily and has been doing this.  - Swelling has decreased significantly  - No current pain with neck movement  Independent Review of Additional Tests or Records:  ED note Charmaine Bough, MD (09/29/24) reviewed: pt with neck abscess,drained  Note from Liuba Soldatova (09/30/24): pt f/u with neck abscess. Still feeling full. Sent her on augmentin.    PMH/Meds/All/SocHx/FamHx/ROS:   Past Medical History:  Diagnosis Date   Chest pain    Family history of adverse reaction to anesthesia    mom nausea and  vomitting   Migraine    Polycystic ovaries    Vaginal Pap smear, abnormal      Past Surgical History:  Procedure Laterality Date   CESAREAN SECTION N/A 05/09/2017   Procedure: CESAREAN SECTION;  Surgeon: Marget Lenis, MD;  Location: [facility] BIRTHING SUITES;  Service: Obstetrics;  Laterality: N/A;   CESAREAN SECTION WITH BILATERAL TUBAL LIGATION N/A 01/24/2023   Procedure: REPEAT CESAREAN SECTION WITH BILATERAL TUBAL LIGATION EDC: 01-30-23 ALLERG: NKDA  PREVIOUS X 1;  Surgeon: Marget Lenis, MD;  Location: [facility] LD ORS;  Service: Obstetrics;  Laterality: N/A;   COLPOSCOPY     LAPAROSCOPIC GASTRIC SLEEVE RESECTION     TPN x5 weeks   WISDOM TOOTH EXTRACTION      Family History  Problem Relation Age of Onset   Bladder Cancer Mother    Thyroid  disease Mother    Kidney disease Father    Diverticulitis Father    Heart disease Maternal Grandmother    Thyroid  disease Maternal Grandmother    Hypertension Paternal Grandmother    Breast cancer Paternal Grandmother    Heart disease Paternal Grandfather    Diabetes Paternal Grandfather    Lung cancer Paternal Grandfather    Thyroid  disease Maternal Aunt      Social Connections: Not on file     Current Outpatient Medications  Medication Instructions   acetaminophen  (TYLENOL ) 650 mg, Oral, Every 4 hours PRN   ALPRAZolam  (XANAX ) 0.5 mg, Oral, 2 times daily PRN   ALPRAZolam  (XANAX ) 0.5 mg, Oral, 2 times daily PRN  amoxicillin -clavulanate (AUGMENTIN) 875-125 MG tablet 1 tablet, Oral, 2 times daily   fluconazole (DIFLUCAN) 150 mg, Oral, Every 3 DAYS   lidocaine  (LIDODERM ) 5 % Apply 1 patch onto the skin once daily. May wear up to 12 hours.   phentermine  (ADIPEX-P ) 37.5 MG tablet Take 1 tablet (37.5 mg total) by mouth every morning before breakfast.   phentermine  (ADIPEX-P ) 37.5 MG tablet Take 1 tablet (37.5 mg total) by mouth every morning before breakfast   phentermine  (ADIPEX-P ) 37.5 MG tablet Take 1 tablet (37.5 mg total) by mouth every morning  before breakfast.   phentermine  (ADIPEX-P ) 37.5 MG tablet Take 1 tablet (37.5 mg total) by mouth every morning before breakfast.   topiramate  (TOPAMAX ) 50 mg, Oral, Daily   traMADol  (ULTRAM ) 50 mg, Oral, Every 8 hours PRN   tretinoin  (RETIN-A ) 0.1 % cream Apply as directed to affected area every night   Tums Ultra 1000 1,000 mg, Daily PRN   Wegovy  0.25 mg, Subcutaneous, Weekly     Physical Exam:   BP 115/81   Pulse 90   LMP 09/15/2024 (Exact Date)   SpO2 98%   Salient findings:  CN II-XII intact No lesions of oral cavity/oropharynx No obviously palpable neck masses/lymphadenopathy/thyromegaly Anterior neck prior abscess site with small scabbed region that oozes clear fluid at times. No fluctuance palpated.  No respiratory distress or stridor  Seprately Identifiable Procedures:  Prior to initiating any procedures, risks/benefits/alternatives were explained to the patient and verbal consent obtained. None  Impression & Plans:  Krystal Ferguson is a 33 y.o. female with   1. Neck abscess    Resolving superficial neck abscess and cellulitis No active infection. She does have some clear drainage when squeezing but discussed that persistent manipulation may impede healing. - Advise cessation of manipulation to promote healing. - Monitor for increase in size, pain, or pus. - Consider surgical excision if symptoms worsen. If the area heals and she is unhappy with scar can consider scar revision. - Schedule follow-up in two weeks.  I personally spent a total of 15 minutes in the care of the patient today including preparing to see the patient, getting/reviewing separately obtained history, and counseling and educating.    See below regarding exact medications prescribed this encounter including dosages and route: No orders of the defined types were placed in this encounter.   Thank you for allowing me the opportunity to care for your patient. Please do not hesitate to contact me  should you have any other questions.  Sincerely, Hadassah Parody, MD Otolaryngologist (ENT), [facility] Phone: [phone] Fax: [phone]

## 2024-10-16 ENCOUNTER — Encounter (HOSPITAL_COMMUNITY): Payer: Self-pay

## 2024-10-16 ENCOUNTER — Other Ambulatory Visit: Payer: Self-pay

## 2024-10-16 ENCOUNTER — Other Ambulatory Visit (HOSPITAL_COMMUNITY): Payer: Self-pay

## 2024-10-17 ENCOUNTER — Other Ambulatory Visit (HOSPITAL_COMMUNITY): Payer: Self-pay

## 2024-10-26 ENCOUNTER — Other Ambulatory Visit (HOSPITAL_COMMUNITY): Payer: Self-pay

## 2024-11-02 ENCOUNTER — Other Ambulatory Visit (HOSPITAL_COMMUNITY): Payer: Self-pay

## 2024-11-02 ENCOUNTER — Ambulatory Visit (INDEPENDENT_AMBULATORY_CARE_PROVIDER_SITE_OTHER)

## 2024-11-02 ENCOUNTER — Encounter (INDEPENDENT_AMBULATORY_CARE_PROVIDER_SITE_OTHER): Payer: Self-pay

## 2024-11-02 VITALS — BP 119/84 | HR 80

## 2024-11-02 DIAGNOSIS — Z872 Personal history of diseases of the skin and subcutaneous tissue: Secondary | ICD-10-CM | POA: Diagnosis not present

## 2024-11-02 DIAGNOSIS — Z09 Encounter for follow-up examination after completed treatment for conditions other than malignant neoplasm: Secondary | ICD-10-CM | POA: Diagnosis not present

## 2024-11-02 DIAGNOSIS — L0211 Cutaneous abscess of neck: Secondary | ICD-10-CM

## 2024-11-02 MED ORDER — WEGOVY 0.5 MG/0.5ML ~~LOC~~ SOAJ
0.5000 mL | SUBCUTANEOUS | 3 refills | Status: AC
  Filled 2024-11-02: qty 2, 28d supply, fill #0

## 2024-11-03 ENCOUNTER — Other Ambulatory Visit (HOSPITAL_COMMUNITY): Payer: Self-pay

## 2024-11-03 NOTE — Progress Notes (Signed)
 Dear Dr. Loreli, Here is my assessment for our mutual patient, Krystal Ferguson. Thank you for allowing me the opportunity to care for your patient. Please do not hesitate to contact me should you have any other questions. Sincerely, Dr. Hadassah Parody  Otolaryngology Clinic Note Referring provider: Dr. Loreli HPI:   Initial HPI Krystal Ferguson - 09/30/24) Krystal Ferguson is a 33 y.o. female with hx of neck abscess drained by Dr. Okey.   She was seen in ED on 09/29/24 and had small abscess after being bitten by something on 10/3. Subsequently developed neck cellulitis. Despite drainage the next day she felt the area was still full. Dr. Soldatova put her on augmentin for 10 days and had her f/u in clinic.   --------------------------------------------------------- 10/15/2024  She is here today for f/u.   Krystal Ferguson is a 33 year old female who presents with a persistent neck abscess following a bug bite.  Neck abscess - Onset following a bug bite on September 18, 2024 - Initially large and painful, described as resembling a 'golf ball' in the neck - Significant discomfort and swelling at onset - Abscess was drained in the emergency department with removal of a large amount of pus - was not cultured  - Completed a course of Augmentin - Persistent small amount of clear, sometimes blood-tinged discharge that occurs only when she squeezes it - she was instructed to squeeze the area daily and has been doing this.  - Swelling has decreased significantly  - No current pain with neck movement  --------------------------------------------------------- 11/02/2024  Today she is doing much better and the area on her neck has healed. She just feels small lump where there is scar tissue.   Independent Review of Additional Tests or Records:  ED note Krystal Bough, MD (09/29/24) reviewed: pt with neck abscess,drained  Note from Krystal Ferguson (09/30/24): pt f/u with neck abscess. Still feeling  full. Sent her on augmentin.    PMH/Meds/All/SocHx/FamHx/ROS:   Past Medical History:  Diagnosis Date   Chest pain    Family history of adverse reaction to anesthesia    mom nausea and vomitting   Migraine    Polycystic ovaries    Vaginal Pap smear, abnormal      Past Surgical History:  Procedure Laterality Date   CESAREAN SECTION N/A 05/09/2017   Procedure: CESAREAN SECTION;  Surgeon: Marget Lenis, MD;  Location: [facility];  Service: Obstetrics;  Laterality: N/A;   CESAREAN SECTION WITH BILATERAL TUBAL LIGATION N/A 01/24/2023   Procedure: REPEAT CESAREAN SECTION WITH BILATERAL TUBAL LIGATION EDC: 01-30-23 ALLERG: NKDA  PREVIOUS X 1;  Surgeon: Marget Lenis, MD;  Location: [facility];  Service: Obstetrics;  Laterality: N/A;   COLPOSCOPY     LAPAROSCOPIC GASTRIC SLEEVE RESECTION     TPN x5 weeks   WISDOM TOOTH EXTRACTION      Family History  Problem Relation Age of Onset   Bladder Cancer Mother    Thyroid  disease Mother    Kidney disease Father    Diverticulitis Father    Heart disease Maternal Grandmother    Thyroid  disease Maternal Grandmother    Hypertension Paternal Grandmother    Breast cancer Paternal Grandmother    Heart disease Paternal Grandfather    Diabetes Paternal Grandfather    Lung cancer Paternal Grandfather    Thyroid  disease Maternal Aunt      Social Connections: Not on file     Current Outpatient Medications  Medication Instructions   acetaminophen  (TYLENOL ) 650 mg,  Oral, Every 4 hours PRN   ALPRAZolam  (XANAX ) 0.5 mg, Oral, 2 times daily PRN   ALPRAZolam  (XANAX ) 0.5 mg, Oral, 2 times daily PRN   amoxicillin -clavulanate (AUGMENTIN) 875-125 MG tablet 1 tablet, Oral, 2 times daily   [employer] F[employer] G[employer]MY [employer] CHILDREN PO 2 tablets, Daily   fluconazole (DIFLUCAN) 150 mg, Oral, Every 3 DAYS   lidocaine  (LIDODERM ) 5 % Apply 1 patch onto the skin once daily. May wear up to 12 hours.   Multiple Vitamins-Minerals (MULTIVITAMIN GUMMIES WOMENS) CHEW 1  tablet, Daily   phentermine  (ADIPEX-P ) 37.5 MG tablet Take 1 tablet (37.5 mg total) by mouth every morning before breakfast.   phentermine  (ADIPEX-P ) 37.5 MG tablet Take 1 tablet (37.5 mg total) by mouth every morning before breakfast   phentermine  (ADIPEX-P ) 37.5 MG tablet Take 1 tablet (37.5 mg total) by mouth every morning before breakfast.   phentermine  (ADIPEX-P ) 37.5 MG tablet Take 1 tablet (37.5 mg total) by mouth every morning before breakfast.   topiramate  (TOPAMAX ) 50 mg, Oral, Daily   traMADol  (ULTRAM ) 50 mg, Oral, Every 8 hours PRN   tretinoin  (RETIN-A ) 0.1 % cream Apply as directed to affected area every night   Tums Ultra 1000 1,000 mg, Daily PRN   Wegovy  0.25 mg, Subcutaneous, Weekly   Wegovy  0.5 mg, Subcutaneous, Weekly     Physical Exam:   BP 119/84 (BP Location: Right Arm, Patient Position: Sitting)   Pulse 80   SpO2 99%   Salient findings:  CN II-XII intact No lesions of oral cavity/oropharynx No obviously palpable neck masses/lymphadenopathy/thyromegaly Anterior neck prior abscess site how completely healed with small amount of fibrous tissue underlying  No respiratory distress or stridor  Seprately Identifiable Procedures:  Prior to initiating any procedures, risks/benefits/alternatives were explained to the patient and verbal consent obtained. None  Impression & Plans:  Krystal Ferguson is a 33 y.o. female with   1. Neck abscess     Resolved superficial neck abscess and cellulitis - can start massaging the area to soften underlying scarring - Right now bothers her that she can feel it. Recommended we wait at least 6 months for thing to totally heal before considering scar revision  - Schedule follow-up in 6 mo.   See below regarding exact medications prescribed this encounter including dosages and route: No orders of the defined types were placed in this encounter.   Thank you for allowing me the opportunity to care for your patient. Please do not  hesitate to contact me should you have any other questions.  Sincerely, Hadassah Parody, MD Otolaryngologist (ENT), [facility] Phone: [phone] Fax: [phone]

## 2024-11-05 ENCOUNTER — Other Ambulatory Visit (HOSPITAL_COMMUNITY): Payer: Self-pay

## 2024-11-05 MED ORDER — SERTRALINE HCL 50 MG PO TABS
50.0000 mg | ORAL_TABLET | Freq: Every day | ORAL | 5 refills | Status: AC
  Filled 2024-11-05: qty 30, 30d supply, fill #0

## 2024-11-23 ENCOUNTER — Other Ambulatory Visit (HOSPITAL_COMMUNITY): Payer: Self-pay

## 2024-11-23 DIAGNOSIS — L0201 Cutaneous abscess of face: Secondary | ICD-10-CM | POA: Diagnosis not present

## 2024-11-23 DIAGNOSIS — L0889 Other specified local infections of the skin and subcutaneous tissue: Secondary | ICD-10-CM | POA: Diagnosis not present

## 2024-11-23 MED ORDER — DOXYCYCLINE MONOHYDRATE 100 MG PO TABS
100.0000 mg | ORAL_TABLET | Freq: Two times a day (BID) | ORAL | 0 refills | Status: DC
  Filled 2024-11-23: qty 14, 7d supply, fill #0

## 2024-11-26 ENCOUNTER — Other Ambulatory Visit (HOSPITAL_COMMUNITY): Payer: Self-pay

## 2024-11-26 MED ORDER — SULFAMETHOXAZOLE-TRIMETHOPRIM 800-160 MG PO TABS
1.0000 | ORAL_TABLET | Freq: Two times a day (BID) | ORAL | 0 refills | Status: DC
  Filled 2024-11-26: qty 20, 10d supply, fill #0

## 2024-11-30 ENCOUNTER — Telehealth (INDEPENDENT_AMBULATORY_CARE_PROVIDER_SITE_OTHER): Payer: Self-pay

## 2024-11-30 ENCOUNTER — Emergency Department (HOSPITAL_BASED_OUTPATIENT_CLINIC_OR_DEPARTMENT_OTHER)

## 2024-11-30 ENCOUNTER — Other Ambulatory Visit: Payer: Self-pay

## 2024-11-30 ENCOUNTER — Encounter (HOSPITAL_BASED_OUTPATIENT_CLINIC_OR_DEPARTMENT_OTHER): Payer: Self-pay | Admitting: Emergency Medicine

## 2024-11-30 ENCOUNTER — Other Ambulatory Visit (HOSPITAL_COMMUNITY): Payer: Self-pay

## 2024-11-30 ENCOUNTER — Emergency Department (HOSPITAL_BASED_OUTPATIENT_CLINIC_OR_DEPARTMENT_OTHER)
Admission: EM | Admit: 2024-11-30 | Discharge: 2024-11-30 | Disposition: A | Discharge status: Home / Self Care | Attending: Emergency Medicine | Admitting: Emergency Medicine

## 2024-11-30 DIAGNOSIS — R220 Localized swelling, mass and lump, head: Secondary | ICD-10-CM | POA: Diagnosis not present

## 2024-11-30 DIAGNOSIS — R600 Localized edema: Secondary | ICD-10-CM | POA: Diagnosis present

## 2024-11-30 DIAGNOSIS — A4902 Methicillin resistant Staphylococcus aureus infection, unspecified site: Secondary | ICD-10-CM | POA: Diagnosis not present

## 2024-11-30 DIAGNOSIS — K1120 Sialoadenitis, unspecified: Secondary | ICD-10-CM

## 2024-11-30 LAB — CBC WITH DIFFERENTIAL/PLATELET
Abs Immature Granulocytes: 0.02 K/uL (ref 0.00–0.07)
Basophils Absolute: 0.1 K/uL (ref 0.0–0.1)
Basophils Relative: 1 %
Eosinophils Absolute: 0.3 K/uL (ref 0.0–0.5)
Eosinophils Relative: 4 %
HCT: 35.2 % — ABNORMAL LOW (ref 36.0–46.0)
Hemoglobin: 10.9 g/dL — ABNORMAL LOW (ref 12.0–15.0)
Immature Granulocytes: 0 %
Lymphocytes Relative: 22 %
Lymphs Abs: 1.7 K/uL (ref 0.7–4.0)
MCH: 25.8 pg — ABNORMAL LOW (ref 26.0–34.0)
MCHC: 31 g/dL (ref 30.0–36.0)
MCV: 83.4 fL (ref 80.0–100.0)
Monocytes Absolute: 0.6 K/uL (ref 0.1–1.0)
Monocytes Relative: 7 %
Neutro Abs: 5.2 K/uL (ref 1.7–7.7)
Neutrophils Relative %: 66 %
Platelets: 232 K/uL (ref 150–400)
RBC: 4.22 MIL/uL (ref 3.87–5.11)
RDW: 14.8 % (ref 11.5–15.5)
WBC: 7.7 K/uL (ref 4.0–10.5)
nRBC: 0 % (ref 0.0–0.2)

## 2024-11-30 LAB — BASIC METABOLIC PANEL WITH GFR
Anion gap: 12 (ref 5–15)
BUN: 8 mg/dL (ref 6–20)
CO2: 23 mmol/L (ref 22–32)
Calcium: 9.6 mg/dL (ref 8.9–10.3)
Chloride: 101 mmol/L (ref 98–111)
Creatinine, Ser: 0.9 mg/dL (ref 0.44–1.00)
GFR, Estimated: >60 mL/min (ref >60)
Glucose, Bld: 105 mg/dL — ABNORMAL HIGH (ref 70–99)
Potassium: 3.9 mmol/L (ref 3.5–5.1)
Sodium: 136 mmol/L (ref 135–145)

## 2024-11-30 MED ORDER — IOHEXOL 300 MG/ML  SOLN
75.0000 mL | Freq: Once | INTRAMUSCULAR | Status: AC | PRN
  Administered 2024-11-30: 20:00:00 75 mL via INTRAVENOUS

## 2024-11-30 MED ORDER — WEGOVY 1 MG/0.5ML ~~LOC~~ SOAJ
0.5000 mL | SUBCUTANEOUS | 11 refills | Status: AC
  Filled 2024-11-30: qty 4, 56d supply, fill #0

## 2024-11-30 NOTE — ED Notes (Signed)
 Patient transported to CT

## 2024-11-30 NOTE — Telephone Encounter (Signed)
 I spoke with patient when she called into office at 4:24pm. She stated that she is at a Dermatologist office now, and they want her to be seen by ENT right now before we close. She has a spot on her face that they cultured and it is MRSA, they told her we need to see her now. I addressed with Chyrl and he stated that since we are about to close she needs to go to ER to be seen if they want her treated for that immediately.

## 2024-11-30 NOTE — ED Provider Notes (Signed)
 [place] EMERGENCY DEPARTMENT AT [facility] Provider Note   CSN: [id number] Arrival date & time: 11/30/24  1703     Patient presents with: Facial Swelling   Krystal Ferguson is a 33 y.o. female.  {Add pertinent medical, surgical, social history, OB history to HPI:773} 33 year old female who presents to the emergency department with swelling on the side of her left face.  Patient reports that for the past week she has been having swelling along the angle of her left mandible.  Says that the dermatologist biopsied it and it was growing MRSA that was susceptible to Bactrim .  She was then started on Bactrim  on Thursday but reports that the facial swelling has worsened.  Temperature has been 55F but no other fevers.  Denies any tooth pain.  Still able to speak and swallow.       Prior to Admission medications  Medication Sig Start Date End Date Taking? Authorizing Provider  acetaminophen  (TYLENOL ) 325 MG tablet Take 2 tablets (650 mg total) by mouth every 4 (four) hours as needed for mild pain (temperature > 101.5.). 01/26/23   [name], MD  ALPRAZolam  (XANAX ) 0.5 MG tablet Take 1 tablet (0.5 mg total) by mouth 2 (two) times daily as needed. 06/03/24     ALPRAZolam  (XANAX ) 0.5 MG tablet Take 1 tablet (0.5 mg total) by mouth 2 (two) times daily as needed. 09/04/24     amoxicillin -clavulanate (AUGMENTIN ) 875-125 MG tablet Take 1 tablet by mouth 2 (two) times daily. 09/30/24   Soldatova, [place], MD  calcium  elemental as carbonate (TUMS ULTRA 1000) 400 MG chewable tablet Chew 1,000 mg by mouth daily as needed for heartburn.    [provider]  [employer] F[employer] CHILDREN PO Take 2 tablets by mouth daily.    [provider]  doxycycline  (ADOXA) 100 MG tablet Take 1 tablet (100 mg total) by mouth 2 (two) times daily. Take with food and full glass of water 11/23/24     fluconazole  (DIFLUCAN ) 150 MG tablet Take 1 tablet (150 mg total) by mouth every 3 (three)  days. 09/24/24     lidocaine  (LIDODERM ) 5 % Apply 1 patch onto the skin once daily. May wear up to 12 hours. 02/20/23     Multiple Vitamins-Minerals (MULTIVITAMIN GUMMIES WOMENS) CHEW Chew 1 tablet by mouth daily.    [provider]  phentermine  (ADIPEX-P ) 37.5 MG tablet Take 1 tablet (37.5 mg total) by mouth every morning before breakfast. Patient not taking: Reported on 11/02/2024 05/15/24     phentermine  (ADIPEX-P ) 37.5 MG tablet Take 1 tablet (37.5 mg total) by mouth every morning before breakfast 07/07/24     phentermine  (ADIPEX-P ) 37.5 MG tablet Take 1 tablet (37.5 mg total) by mouth every morning before breakfast. 08/12/24     phentermine  (ADIPEX-P ) 37.5 MG tablet Take 1 tablet (37.5 mg total) by mouth every morning before breakfast. Patient not taking: Reported on 11/02/2024 09/30/24     semaglutide -weight management (WEGOVY ) 0.25 MG/0.5ML SOAJ SQ injection Inject 0.25 mg into the skin once a week. 10/07/24     semaglutide -weight management (WEGOVY ) 0.5 MG/0.5ML SOAJ SQ injection Inject 0.5 mg into the skin once a week. 11/02/24     semaglutide -weight management (WEGOVY ) 1 MG/0.5ML SOAJ SQ injection Inject 1 mg into the skin once a week. 11/30/24     sertraline  (ZOLOFT ) 50 MG tablet Take 1 tablet (50 mg total) by mouth daily. 11/05/24     sulfamethoxazole -trimethoprim  (BACTRIM  DS) 800-160 MG tablet Take 1  tablet by mouth twice a day 11/26/24     topiramate  (TOPAMAX ) 50 MG tablet Take 1 tablet (50 mg total) by mouth daily. Patient not taking: Reported on 11/02/2024 09/30/24     traMADol  (ULTRAM ) 50 MG tablet Take 1 tablet (50 mg total) by mouth every 8 (eight) hours as needed. 09/24/24     tretinoin  (RETIN-A ) 0.1 % cream Apply as directed to affected area every night Patient taking differently: Apply 1 Application topically as needed. 05/07/24       Allergies: Citrus    Review of Systems  Updated Vital Signs BP 124/76 (BP Location: Right Arm)   Pulse 90   Temp 98.2 F (36.8 C)    Resp 16   SpO2 100%   Physical Exam Constitutional:      Appearance: Normal appearance.  HENT:     Head: Atraumatic.     Comments: Swelling and tenderness palpation of left parotid gland.  Minimal amount of erythema externally.  No apparent stones in the mouth.  No Sub lingual or submandibular swelling    Right Ear: External ear normal.     Left Ear: External ear normal.     Nose: Nose normal. No congestion.     Mouth/Throat:     Comments: Uvula: without deviation or edema. Uvula midline. Soft palate: without swelling Sublingual: normal appearance/no brawny edema or tongue elevation Teeth and gums: No periapical swelling or fluctuance, no tooth fracture, no gingival swelling, no active oral bleeding. No TTP of teeth.  Tonsils: no erythema or exudates Neurological:     Mental Status: She is alert.     (all labs ordered are listed, but only abnormal results are displayed) Labs Reviewed  CBC WITH DIFFERENTIAL/PLATELET - Abnormal; Notable for the following components:      Result Value   Hemoglobin 10.9 (*)    HCT 35.2 (*)    MCH 25.8 (*)    All other components within normal limits  BASIC METABOLIC PANEL WITH GFR - Abnormal; Notable for the following components:   Glucose, Bld 105 (*)    All other components within normal limits    EKG: None  Radiology: No results found.  {Document cardiac monitor, telemetry assessment procedure when appropriate:32947} Procedures   Medications Ordered in the ED - No data to display    {Click here for ABCD2, HEART and other calculators REFRESH Note before signing:1}                              Medical Decision Making Amount and/or Complexity of Data Reviewed Labs: ordered. Radiology: ordered.  Risk Prescription drug management.   Krystal Ferguson is a 33 year old female who presents to the emergency department swelling of the left side of her face  Initial Ddx:  Parotitis, sialadenitis, cellulitis, dental abscess, Ludwig's  angina  MDM/Course:  Patient *** upon re-evaluation ***  This patient presents to the ED for concern of complaints listed in HPI, this involves an extensive number of treatment options, and is a complaint that carries with it a high risk of complications and morbidity. Disposition including potential need for admission considered.   Dispo: {Disposition:28069}  I have reviewed the patients home medications and made adjustments as needed Additional history obtained from {Additional History:28067} Records reviewed {Records Reviewed:28068} The following labs were independently interpreted: {labs interpreted:28064} and show {lab findings:28250} I independently reviewed the following imaging with scope of interpretation limited to determining acute life threatening conditions  related to emergency care: {imaging interpreted:28065} and agree with the radiologist interpretation with the following exceptions: none I personally reviewed and interpreted cardiac monitoring: {cardiac monitoring:28251} I personally reviewed and interpreted the pt's EKG: see above for interpretation  Consults: {Consultants:28063} Social Determinants of health:  ***  Portions of this note were generated with Scientist, clinical (histocompatibility and immunogenetics). Dictation errors may occur despite best attempts at proofreading.     Final diagnoses:  None    ED Discharge Orders     None

## 2024-11-30 NOTE — ED Triage Notes (Signed)
 Facial swelling.x a week Left side Started on septra  DS started Thursday Still doesn't feel good

## 2024-11-30 NOTE — Discharge Instructions (Signed)
 You were seen for your infected salivary gland in the emergency department.   At home, please use sour lozenges, lemon drops, or lemon juice to increase salivation to help pass the stone.  Take the antibiotics you were prescribed.  Check your MyChart online for the results of any tests that had not resulted by the time you left the emergency department.   Follow-up with your primary doctor in 2-3 days regarding your visit.  Follow-up with the ENT doctors in 1 to 2 weeks if your symptoms do not improve.  Return immediately to the emergency department if you experience any of the following: Worsening pain, high fevers, inability to swallow, difficulty breathing, or any other concerning symptoms.    Thank you for visiting our Emergency Department. It was a pleasure taking care of you today.

## 2024-12-01 ENCOUNTER — Other Ambulatory Visit (HOSPITAL_COMMUNITY): Payer: Self-pay

## 2024-12-09 DIAGNOSIS — D509 Iron deficiency anemia, unspecified: Secondary | ICD-10-CM | POA: Diagnosis not present

## 2024-12-12 ENCOUNTER — Other Ambulatory Visit (HOSPITAL_COMMUNITY): Payer: Self-pay

## 2024-12-12 ENCOUNTER — Other Ambulatory Visit: Payer: Self-pay

## 2024-12-12 ENCOUNTER — Ambulatory Visit
Admission: EM | Admit: 2024-12-12 | Discharge: 2024-12-12 | Disposition: A | Discharge status: Home / Self Care | Attending: Physician Assistant | Admitting: Physician Assistant

## 2024-12-12 DIAGNOSIS — R0989 Other specified symptoms and signs involving the circulatory and respiratory systems: Secondary | ICD-10-CM

## 2024-12-12 MED ORDER — ALBUTEROL SULFATE HFA 108 (90 BASE) MCG/ACT IN AERS
1.0000 | INHALATION_SPRAY | Freq: Four times a day (QID) | RESPIRATORY_TRACT | 0 refills | Status: AC | PRN
  Filled 2024-12-12: qty 6.7, 25d supply, fill #0

## 2024-12-12 MED ORDER — FLUTICASONE PROPIONATE 50 MCG/ACT NA SUSP
1.0000 | Freq: Every day | NASAL | 0 refills | Status: AC
  Filled 2024-12-12: qty 16, 60d supply, fill #0

## 2024-12-12 NOTE — ED Triage Notes (Signed)
 Pt presents with c/o cough and nasal congestion. This is day five of symptoms. Currently denies pain. No fevers. Highest temperature at home has been 99.5 F. Both children are sick. OTC Nyquil + Tylenol  taken at home. Feels like her sxs are becoming worse even with the medications.

## 2024-12-12 NOTE — Discharge Instructions (Signed)

## 2024-12-12 NOTE — ED Provider Notes (Signed)
 [facility]    CSN: [id number] Arrival date & time: 12/12/24  9073      History   Chief Complaint Chief Complaint  Patient presents with   Cough   Nasal Congestion    HPI Krystal Ferguson is a 33 y.o. female.   HPI  Pt presents today with concerns for persistent coughing and nasal congestion that has been ongoing since Monday, 12/22. She reports she is taking Nyquil every 6 hours along with tylenol  and  her symptoms are seemingly  not improving. She reports that her children are sick with similar symptoms- one has a sinus infection and the other is seemingly improving from viral illness. She reports difficulty with expectoration due to thick mucus. She reports some mild intermittent headaches and SOB.  She reports ear pressure but denies pain. 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   Morbid obesity (HCC) 03/12/2016   Prediabetes 10/05/2015   Morbid obesity with BMI of 40.0-44.9, adult (HCC) 10/05/2015   Weakness 04/06/2014   Left-sided weakness 04/06/2014   Migraine headache 04/06/2014   Hypokalemia 04/06/2014   Polycystic ovaries    Chest pain     Past Surgical History:  Procedure Laterality Date   CESAREAN SECTION N/A 05/09/2017   Procedure: CESAREAN SECTION;  Surgeon: Marget Lenis, MD;  Location: [facility] BIRTHING SUIT[facility];  Service: Obstetrics;  Laterality: N/A;   CESAREAN SECTION WITH BILATERAL TUBAL LIGATION N/A 01/24/2023   Procedure: REPEAT CESAREAN  SECTION WITH BILATERAL TUBAL LIGATION EDC: 01-30-23 ALLERG: NKDA  PREVIOUS X 1;  Surgeon: Marget Lenis, MD;  Location: [facility] LD ORS;  Service: Obstetrics;  Laterality: N/A;   COLPOSCOPY     LAPAROSCOPIC GASTRIC SLEEVE RESECTION     TPN x5 weeks   WISDOM TOOTH EXTRACTION      OB History     Gravida  3   Para  2   Term  2   Preterm      AB  1   Living  2      SAB  1   IAB      Ectopic      Multiple  0   Live Births  2            Home Medications    Prior to Admission medications  Medication Sig Start Date End Date Taking? Authorizing Provider  albuterol  (VENTOLIN  HFA) 108 (90 Base) MCG/ACT inhaler Inhale 1-2 puffs into the lungs every 6 (six) hours as needed for wheezing or shortness of breath. 12/12/24  Yes Ras Kollman E, PA-C  fluticasone  (FLONASE ) 50 MCG/ACT nasal spray Place 1 spray into both nostrils daily. 12/12/24  Yes Abagael Kramm E, PA-C  acetaminophen  (TYLENOL ) 325 MG tablet Take 2 tablets (650 mg total) by mouth every 4 (four) hours as needed for mild pain (temperature > 101.5.). 01/26/23   [name], MD  ALPRAZolam  (XANAX ) 0.5 MG tablet  Take 1 tablet (0.5 mg total) by mouth 2 (two) times daily as needed. 06/03/24     ALPRAZolam  (XANAX ) 0.5 MG tablet Take 1 tablet (0.5 mg total) by mouth 2 (two) times daily as needed. 09/04/24     calcium  elemental as carbonate (TUMS ULTRA 1000) 400 MG chewable tablet Chew 1,000 mg by mouth daily as needed for heartburn.    [provider]  [facility] FIBER GUMMY BEARS CHILDREN PO Take 2 tablets by mouth daily.    [provider]  fluconazole  (DIFLUCAN ) 150 MG tablet Take 1 tablet (150 mg total) by mouth every 3 (three) days. 09/24/24     lidocaine  (LIDODERM ) 5 % Apply 1 patch onto the skin once daily. May wear up to 12 hours. 02/20/23     Multiple Vitamins-Minerals (MULTIVITAMIN GUMMIES WOMENS) CHEW Chew 1 tablet by mouth daily.    [provider]  phentermine  (ADIPEX-P ) 37.5 MG tablet Take 1 tablet  (37.5 mg total) by mouth every morning before breakfast. Patient not taking: Reported on 11/02/2024 05/15/24     phentermine  (ADIPEX-P ) 37.5 MG tablet Take 1 tablet (37.5 mg total) by mouth every morning before breakfast 07/07/24     phentermine  (ADIPEX-P ) 37.5 MG tablet Take 1 tablet (37.5 mg total) by mouth every morning before breakfast. 08/12/24     phentermine  (ADIPEX-P ) 37.5 MG tablet Take 1 tablet (37.5 mg total) by mouth every morning before breakfast. Patient not taking: Reported on 11/02/2024 09/30/24     semaglutide -weight management (WEGOVY ) 0.25 MG/0.5ML SOAJ SQ injection Inject 0.25 mg into the skin once a week. 10/07/24     semaglutide -weight management (WEGOVY ) 0.5 MG/0.5ML SOAJ SQ injection Inject 0.5 mg into the skin once a week. 11/02/24     semaglutide -weight management (WEGOVY ) 1 MG/0.5ML SOAJ SQ injection Inject 1 mg into the skin once a week. 11/30/24     sertraline  (ZOLOFT ) 50 MG tablet Take 1 tablet (50 mg total) by mouth daily. 11/05/24     topiramate  (TOPAMAX ) 50 MG tablet Take 1 tablet (50 mg total) by mouth daily. Patient not taking: Reported on 11/02/2024 09/30/24     traMADol  (ULTRAM ) 50 MG tablet Take 1 tablet (50 mg total) by mouth every 8 (eight) hours as needed. 09/24/24     tretinoin  (RETIN-A ) 0.1 % cream Apply as directed to affected area every night Patient taking differently: Apply 1 Application topically as needed. 05/07/24       Family History Family History  Problem Relation Age of Onset   Bladder Cancer Mother    Thyroid  disease Mother    Kidney disease Father    Diverticulitis Father    Heart disease Maternal Grandmother    Thyroid  disease Maternal Grandmother    Hypertension Paternal Grandmother    Breast cancer Paternal Grandmother    Heart disease Paternal Grandfather    Diabetes Paternal Grandfather    Lung cancer Paternal Grandfather    Thyroid  disease Maternal Aunt     Social History Social History[1]   Allergies   Citrus   Review  of Systems Review of Systems  Constitutional:  Negative for chills and fever.  HENT:  Positive for congestion and postnasal drip. Negative for ear pain, sinus pressure, sinus pain and sore throat.   Respiratory:  Positive for cough and shortness of breath. Negative for wheezing.   Gastrointestinal:  Negative for diarrhea, nausea and vomiting.  Neurological:  Positive for headaches.     Physical Exam Triage Vital Signs ED Triage Vitals [12/12/24 1128]  Encounter Vitals Group     BP 95/73     Girls Systolic BP Percentile      Girls Diastolic BP Percentile      Boys Systolic BP Percentile      Boys Diastolic BP Percentile      Pulse Rate 80     Resp 17     Temp 98.3 F (36.8 C)     Temp Source Oral     SpO2 97 %     Weight 189 lb (85.7 kg)     Height 5' 3 (1.6 m)     Head Circumference      Peak Flow      Pain Score 0     Pain Loc      Pain Education      Exclude from Growth Chart    No data found.  Updated Vital Signs BP 95/73 (BP Location: Right Arm)   Pulse 80   Temp 98.3 F (36.8 C) (Oral)   Resp 17   Ht 5' 3 (1.6 m)   Wt 189 lb (85.7 kg)   LMP 12/05/2024 (Exact Date)   SpO2 97%   Breastfeeding No   BMI 33.48 kg/m   Visual Acuity Right Eye Distance:   Left Eye Distance:   Bilateral Distance:    Right Eye Near:   Left Eye Near:    Bilateral Near:     Physical Exam Vitals reviewed.  Constitutional:      General: She is awake.     Appearance: Normal appearance. She is well-developed and well-groomed.  HENT:     Head: Normocephalic and atraumatic.     Right Ear: Hearing, tympanic membrane and ear canal normal.     Left Ear: Hearing, tympanic membrane and ear canal normal.     Nose: Nose normal.     Right Sinus: No maxillary sinus tenderness or frontal sinus tenderness.     Left Sinus: No maxillary sinus tenderness or frontal sinus tenderness.     Mouth/Throat:     Lips: Pink.     Mouth: Mucous membranes are moist.     Pharynx: Oropharynx is  clear. Uvula midline. No pharyngeal swelling, oropharyngeal exudate, posterior oropharyngeal erythema, uvula swelling or postnasal drip.  Cardiovascular:     Rate and Rhythm: Normal rate and regular rhythm.     Pulses: Normal pulses.          Radial pulses are 2+ on the right side and 2+ on the left side.     Heart sounds: Normal heart sounds. No murmur heard.    No friction rub. No gallop.  Pulmonary:     Effort: Pulmonary effort is normal.     Breath sounds: Normal breath sounds. No decreased air movement. No decreased breath sounds, wheezing, rhonchi or rales.  Musculoskeletal:     Cervical back: Normal range of motion and neck supple.  Lymphadenopathy:     Head:     Right side of head: No submental, submandibular or preauricular adenopathy.     Left side of head: No submental, submandibular or preauricular adenopathy.     Cervical:     Right cervical: No superficial cervical adenopathy.    Left cervical: No superficial cervical adenopathy.     Upper Body:     Right upper body: No supraclavicular adenopathy.     Left upper body: No supraclavicular adenopathy.  Neurological:     General: No focal deficit present.     Mental Status: She is  alert and oriented to person, place, and time.  Psychiatric:        Mood and Affect: Mood normal.        Behavior: Behavior normal. Behavior is cooperative.        Thought Content: Thought content normal.        Judgment: Judgment normal.      UC Treatments / Results  Labs (all labs ordered are listed, but only abnormal results are displayed) Labs Reviewed - No data to display  EKG   Radiology No results found.  Procedures Procedures (including critical care time)  Medications Ordered in UC Medications - No data to display  Initial Impression / Assessment and Plan / UC Course  I have reviewed the triage vital signs and the nursing notes.  Pertinent labs & imaging results that were available during my care of the patient were  reviewed by me and considered in my medical decision making (see chart for details).      Final Clinical Impressions(s) / UC Diagnoses   Final diagnoses:  Symptoms of upper respiratory infection (URI)   Patient presents today with concerns for persistent coughing, nasal congestion has been ongoing since 12/07/2024.  She has been taking OTC medications for relief but this has not provided significant improvement.  She reports that her children are sick with similar symptoms.  Vitals and physical exam are largely reassuring without evidence of severe illness or infection.  Patient is outside therapeutic window for antiviral send no COVID or flu testing today.  Based on patient's concerns we will send her home with albuterol  rescue inhaler as well as Flonase  nasal spray to help manage persistent coughing, shortness of breath and nasal congestion respectively.  Reviewed OTC medications to further supplement symptomatic relief.  ED and return precautions reviewed and provided in AVS.  Follow-up as needed    Discharge Instructions      Based on your described symptoms and the duration of symptoms it is likely that you have a viral upper respiratory infection (often called a cold)  Symptoms can last for 3-10 days with lingering cough and intermittent symptoms potentially  lasting several  weeks after that.  The goal of treatment at this time is to reduce your symptoms and discomfort   You can use the following medications and measures to help yourself feel better until your body fights this off: DayQuil/NyQuil, TheraFlu, Alka-Seltzer  (these medications typically have the same active ingredients in them so you can choose whichever one you prefer and take consistently during the day and night according to the manufactures instructions.) Flonase  A daily antihistamine such as Zyrtec, Claritin, Allegra per your preference.  Please choose 1 and take consistently. Increased fluids.  It is recommended  that you take in at least 64 ounces of water per day when you are not sick so it is important to increase this when you are sick and your body may be running fever. Rest Cough drops Chloraseptic throat spray to help with sore throat Nasal saline spray or nasal flushes to help with congestion and runny nose  If your symptoms seem like they are getting worse over the next 5 to 7 days or not improving you can always follow-up here in urgent care or go to your primary care provider for further management. Go to the ER if you begin to have more serious symptoms such as shortness of breath, trouble breathing, loss of consciousness, swelling around the eyes, high fever, severe lasting headaches, vision changes or  neck pain/stiffness.       ED Prescriptions     Medication Sig Dispense Auth. Provider   albuterol  (VENTOLIN  HFA) 108 (90 Base) MCG/ACT inhaler Inhale 1-2 puffs into the lungs every 6 (six) hours as needed for wheezing or shortness of breath. 6.7 g Wynetta Seith E, PA-C   fluticasone  (FLONASE ) 50 MCG/ACT nasal spray Place 1 spray into both nostrils daily. 16 g Erikka Follmer E, PA-C      PDMP not reviewed this encounter.     [1]  Social History Tobacco Use   Smoking status: Never    Passive exposure: Never   Smokeless tobacco: Never  Vaping Use   Vaping status: Never Used  Substance Use Topics   Alcohol use: Not Currently    Comment: occassional   Drug use: No     Marylene Rocky BRAVO, PA-C 12/13/24 0835  "

## 2024-12-14 ENCOUNTER — Other Ambulatory Visit (HOSPITAL_COMMUNITY): Payer: Self-pay

## 2024-12-14 ENCOUNTER — Telehealth (HOSPITAL_COMMUNITY): Payer: Self-pay | Admitting: Pharmacy Technician

## 2024-12-14 ENCOUNTER — Other Ambulatory Visit (HOSPITAL_COMMUNITY): Payer: Self-pay | Admitting: Advanced Practice Midwife

## 2024-12-14 DIAGNOSIS — D509 Iron deficiency anemia, unspecified: Secondary | ICD-10-CM | POA: Insufficient documentation

## 2024-12-14 NOTE — Telephone Encounter (Signed)
 Auth Submission: DENIED Site of care: [facility] Payer: [employer] ([employer]) Medication & CPT/J Code(s) submitted: Feraheme  (ferumoxytol ) [id number] Diagnosis Code: D50.9 Route of submission (phone, fax, portal): [employer] Phone # Fax # Auth type: Buy/Bill HB Units/visits requested: 510mg  x 2 doses  Denial reason: Pt must have tried and failed Infed or Venofer   Dagoberto Armour, CPhT [facility] Phone: [phone] 12/14/2024

## 2024-12-25 NOTE — Telephone Encounter (Signed)
 [facility] Patient Advocate Encounter  12/25/24: Called [facility] and spoke to [facility], she checked note that was sent and pt prefers Feraheme  so MD is trying to decide what to do. She will send message.  12/22/24:  Spoke to Darice at [facility] and she sent message to nurse about Feraheme  being denied and requesting preferred med, Venofer .

## 2025-01-06 ENCOUNTER — Other Ambulatory Visit: Payer: Self-pay

## 2025-01-06 ENCOUNTER — Other Ambulatory Visit (HOSPITAL_COMMUNITY): Payer: Self-pay

## 2025-01-06 MED ORDER — WEGOVY 1.7 MG/0.75ML ~~LOC~~ SOAJ
1.7000 mg | SUBCUTANEOUS | 11 refills | Status: AC
  Filled 2025-01-06: qty 3, 28d supply, fill #0

## 2025-01-08 ENCOUNTER — Other Ambulatory Visit (HOSPITAL_COMMUNITY): Payer: Self-pay

## 2025-01-12 NOTE — Telephone Encounter (Signed)
 01/12/25: Spoke to Krystal Ferguson  and appeal determination was not approved. MD is going to refer patient to hematology instead. Okay to d/c therapy plan.

## 2025-02-12 ENCOUNTER — Inpatient Hospital Stay: Admitting: Oncology

## 2025-02-12 ENCOUNTER — Inpatient Hospital Stay

## 2025-03-05 ENCOUNTER — Ambulatory Visit: Admitting: Neurology

## 2025-05-03 ENCOUNTER — Ambulatory Visit (INDEPENDENT_AMBULATORY_CARE_PROVIDER_SITE_OTHER)
